# Patient Record
Sex: Female | Born: 1975 | Race: White | Hispanic: No | Marital: Married | Smoking: Former smoker
Health system: Southern US, Community
[De-identification: ages and names within clinical notes are randomized; demographics above are authoritative.]

## PROBLEM LIST (undated history)

## (undated) ENCOUNTER — Inpatient Hospital Stay (HOSPITAL_COMMUNITY): Payer: Self-pay

## (undated) DIAGNOSIS — Z9889 Other specified postprocedural states: Secondary | ICD-10-CM

## (undated) DIAGNOSIS — Z6835 Body mass index (BMI) 35.0-35.9, adult: Secondary | ICD-10-CM

## (undated) DIAGNOSIS — R200 Anesthesia of skin: Secondary | ICD-10-CM

## (undated) DIAGNOSIS — I10 Essential (primary) hypertension: Secondary | ICD-10-CM

## (undated) DIAGNOSIS — F319 Bipolar disorder, unspecified: Secondary | ICD-10-CM

## (undated) DIAGNOSIS — H9192 Unspecified hearing loss, left ear: Secondary | ICD-10-CM

## (undated) DIAGNOSIS — M5090 Cervical disc disorder, unspecified, unspecified cervical region: Secondary | ICD-10-CM

## (undated) DIAGNOSIS — K219 Gastro-esophageal reflux disease without esophagitis: Secondary | ICD-10-CM

## (undated) DIAGNOSIS — E785 Hyperlipidemia, unspecified: Secondary | ICD-10-CM

## (undated) DIAGNOSIS — R202 Paresthesia of skin: Secondary | ICD-10-CM

## (undated) DIAGNOSIS — O021 Missed abortion: Secondary | ICD-10-CM

## (undated) DIAGNOSIS — F419 Anxiety disorder, unspecified: Secondary | ICD-10-CM

## (undated) DIAGNOSIS — R112 Nausea with vomiting, unspecified: Secondary | ICD-10-CM

## (undated) DIAGNOSIS — R609 Edema, unspecified: Secondary | ICD-10-CM

## (undated) DIAGNOSIS — R51 Headache: Secondary | ICD-10-CM

## (undated) DIAGNOSIS — F329 Major depressive disorder, single episode, unspecified: Secondary | ICD-10-CM

## (undated) DIAGNOSIS — R0602 Shortness of breath: Secondary | ICD-10-CM

## (undated) DIAGNOSIS — E7800 Pure hypercholesterolemia, unspecified: Secondary | ICD-10-CM

## (undated) DIAGNOSIS — J45909 Unspecified asthma, uncomplicated: Secondary | ICD-10-CM

## (undated) DIAGNOSIS — N951 Menopausal and female climacteric states: Secondary | ICD-10-CM

## (undated) DIAGNOSIS — D649 Anemia, unspecified: Secondary | ICD-10-CM

## (undated) DIAGNOSIS — K029 Dental caries, unspecified: Secondary | ICD-10-CM

## (undated) DIAGNOSIS — F32A Depression, unspecified: Secondary | ICD-10-CM

## (undated) DIAGNOSIS — R079 Chest pain, unspecified: Secondary | ICD-10-CM

## (undated) DIAGNOSIS — J42 Unspecified chronic bronchitis: Secondary | ICD-10-CM

## (undated) DIAGNOSIS — R1011 Right upper quadrant pain: Secondary | ICD-10-CM

## (undated) HISTORY — DX: Anesthesia of skin: R20.0

## (undated) HISTORY — PX: CHOLECYSTECTOMY: SHX55

## (undated) HISTORY — DX: Chest pain, unspecified: R07.9

## (undated) HISTORY — DX: Right upper quadrant pain: R10.11

## (undated) HISTORY — DX: Shortness of breath: R06.02

## (undated) HISTORY — DX: Edema, unspecified: R60.9

## (undated) HISTORY — DX: Bipolar disorder, unspecified: F31.9

## (undated) HISTORY — DX: Cervical disc disorder, unspecified, unspecified cervical region: M50.90

## (undated) HISTORY — DX: Body mass index (BMI) 35.0-35.9, adult: Z68.35

## (undated) HISTORY — PX: OTHER SURGICAL HISTORY: SHX169

## (undated) HISTORY — PX: COLONOSCOPY: SHX174

## (undated) HISTORY — DX: Menopausal and female climacteric states: N95.1

## (undated) HISTORY — PX: ABDOMINAL HYSTERECTOMY: SHX81

## (undated) HISTORY — DX: Anesthesia of skin: R20.2

---

## 1998-02-01 ENCOUNTER — Other Ambulatory Visit: Admission: RE | Admit: 1998-02-01 | Discharge: 1998-02-01 | Payer: Self-pay | Admitting: *Deleted

## 1998-09-12 ENCOUNTER — Other Ambulatory Visit: Admission: RE | Admit: 1998-09-12 | Discharge: 1998-09-12 | Payer: Self-pay | Admitting: Obstetrics

## 1999-04-20 ENCOUNTER — Other Ambulatory Visit: Admission: RE | Admit: 1999-04-20 | Discharge: 1999-04-20 | Payer: Self-pay | Admitting: Obstetrics

## 1999-05-27 ENCOUNTER — Inpatient Hospital Stay (HOSPITAL_COMMUNITY): Admission: AD | Admit: 1999-05-27 | Discharge: 1999-05-27 | Payer: Self-pay | Admitting: Obstetrics

## 1999-08-26 ENCOUNTER — Inpatient Hospital Stay (HOSPITAL_COMMUNITY): Admission: AD | Admit: 1999-08-26 | Discharge: 1999-08-26 | Payer: Self-pay | Admitting: Obstetrics

## 1999-11-09 ENCOUNTER — Inpatient Hospital Stay (HOSPITAL_COMMUNITY): Admission: AD | Admit: 1999-11-09 | Discharge: 1999-11-09 | Payer: Self-pay | Admitting: Obstetrics

## 1999-11-10 ENCOUNTER — Inpatient Hospital Stay (HOSPITAL_COMMUNITY): Admission: AD | Admit: 1999-11-10 | Discharge: 1999-11-12 | Payer: Self-pay | Admitting: Obstetrics

## 2000-04-15 ENCOUNTER — Other Ambulatory Visit: Admission: RE | Admit: 2000-04-15 | Discharge: 2000-04-15 | Payer: Self-pay | Admitting: Obstetrics

## 2000-04-16 ENCOUNTER — Emergency Department (HOSPITAL_COMMUNITY): Admission: EM | Admit: 2000-04-16 | Discharge: 2000-04-17 | Payer: Self-pay | Admitting: Emergency Medicine

## 2000-04-16 ENCOUNTER — Encounter: Payer: Self-pay | Admitting: *Deleted

## 2000-07-04 ENCOUNTER — Encounter: Payer: Self-pay | Admitting: *Deleted

## 2000-07-04 ENCOUNTER — Ambulatory Visit (HOSPITAL_COMMUNITY): Admission: RE | Admit: 2000-07-04 | Discharge: 2000-07-04 | Payer: Self-pay | Admitting: *Deleted

## 2000-11-08 ENCOUNTER — Other Ambulatory Visit: Admission: RE | Admit: 2000-11-08 | Discharge: 2000-11-08 | Payer: Self-pay | Admitting: Obstetrics

## 2000-11-22 ENCOUNTER — Encounter: Payer: Self-pay | Admitting: Gastroenterology

## 2000-11-22 ENCOUNTER — Encounter: Admission: RE | Admit: 2000-11-22 | Discharge: 2000-11-22 | Payer: Self-pay | Admitting: Gastroenterology

## 2000-12-03 ENCOUNTER — Ambulatory Visit (HOSPITAL_COMMUNITY): Admission: RE | Admit: 2000-12-03 | Discharge: 2000-12-03 | Payer: Self-pay | Admitting: Gastroenterology

## 2000-12-03 ENCOUNTER — Encounter: Payer: Self-pay | Admitting: Gastroenterology

## 2001-01-06 ENCOUNTER — Observation Stay (HOSPITAL_COMMUNITY): Admission: RE | Admit: 2001-01-06 | Discharge: 2001-01-06 | Payer: Self-pay | Admitting: General Surgery

## 2001-01-06 ENCOUNTER — Encounter (INDEPENDENT_AMBULATORY_CARE_PROVIDER_SITE_OTHER): Payer: Self-pay

## 2002-08-11 ENCOUNTER — Encounter: Payer: Self-pay | Admitting: Surgery

## 2002-08-11 ENCOUNTER — Encounter: Admission: RE | Admit: 2002-08-11 | Discharge: 2002-08-11 | Payer: Self-pay | Admitting: Surgery

## 2005-02-13 ENCOUNTER — Emergency Department (HOSPITAL_COMMUNITY): Admission: EM | Admit: 2005-02-13 | Discharge: 2005-02-13 | Payer: Self-pay | Admitting: Emergency Medicine

## 2010-01-19 ENCOUNTER — Emergency Department: Payer: Self-pay | Admitting: Emergency Medicine

## 2010-01-19 ENCOUNTER — Encounter: Payer: Self-pay | Admitting: Cardiovascular Disease

## 2010-01-25 ENCOUNTER — Ambulatory Visit: Payer: Self-pay | Admitting: Cardiovascular Disease

## 2010-01-25 DIAGNOSIS — R51 Headache: Secondary | ICD-10-CM

## 2010-01-25 DIAGNOSIS — R079 Chest pain, unspecified: Secondary | ICD-10-CM

## 2010-01-25 DIAGNOSIS — R519 Headache, unspecified: Secondary | ICD-10-CM | POA: Insufficient documentation

## 2010-01-31 ENCOUNTER — Ambulatory Visit: Payer: Self-pay | Admitting: Cardiovascular Disease

## 2010-08-22 NOTE — Letter (Signed)
Summary: Work Writer at [employer]. [address]   [place], [place] [address]   Phone: [phone]  Fax: [phone]     January 25, 2010    [facility]   The above named patient had a medical visit today at:     9:30  am / pm.  Please take this into consideration when reviewing the time away from work/school.      Sincerely yours,    Architectural technologist

## 2010-08-22 NOTE — Assessment & Plan Note (Signed)
Summary: NEW PT   Visit Type:  Initial Consult Referring Provider:  Lurena Joiner Lord,M.D. Primary Provider:  Dixie Dials, M.D.  CC:  Was at [facility] on January 19, 2010 with chest pain, and shortness of breath and headache on left side. Has irreg. heart beats mostly with activity.  .  History of Present Illness: Monica Ballard is a very pleasant 35 year old woman with no significant cardiac history, a long smoking history with history of bronchitis/pneumonia last year, presenting for evaluation of symptoms of chest discomfort, severe headache, weakness, numbness over her left arm, losing weight, poor appetite and easy bruising.  She reports that she was recently seen in the emergency room for severe headache. She woke up early in the morning to a severe headache on the left side of her head. She then developed radiating discomfort down the left side of her neck, left posterior shoulder, left arm, around her waist, left leg, some chest discomfort as well. She never had symptoms like this before. 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 She is decreased her work schedule since her headaches and chest discomfort and arm discomfort. She noted in the emergency room that her blood pressure gets a little below, and reports having occasional dizziness at work. She also reports noting a low heart rate for a brief period of time in the emergency room with heart rates in the 40s.she has had a 15 pound weight loss over the past several months. She reports bad sleep hygiene, feeling tired all the time.  EKG today shows normal sinus rhythm with rate 64 beats per minute, no significant ST or T wave changes.  EKG an emergency room June 30 shows sinus rhythm with sinus arrhythmia and no significant ST or T wave changes.  Preventive Screening-Counseling & Management  Alcohol-Tobacco     Smoking Status: current  Caffeine-Diet-Exercise  Does Patient Exercise: no      Drug Use:  no.    Current Medications (verified): 1)  Cymbalta 60 Mg Cpep (Duloxetine Hcl) .Marland Kitchen.. 1 Once Daily 2)  Klonopin 1 Mg Tabs (Clonazepam) .Marland Kitchen.. 1 Two Times A Day 3)  Baby Aspirin 81 Mg Chew (Aspirin) .Marland Kitchen.. 1 Once Daily 4)  Albuterol Sulfate 0.63 Mg/74ml Nebu (Albuterol Sulfate) .... As Needed  Allergies (verified): 1)  ! Pcn  Past History:  Past Surgical History: Left ear drum rebuilt x 2 gallbladder removed  Family History: Family History of Cancer:  Family History of Coronary Artery Disease: mother  MVD: mother Family History of Diabetes:  Family History of Hyperlipidemia: mother Family History of Hypertension: mother & father Family History of Depression:   Social History: Part Time  Married  Tobacco Use - Yes. 1-1/2 PPD x 8 years. Alcohol Use - no Regular Exercise - no Drug Use - no Smoking Status:  current Does Patient Exercise:  no Drug Use:  no  Review of Systems       The patient complains of chest pain.  The patient denies fever, weight loss, weight gain, vision loss, decreased hearing, hoarseness, syncope, dyspnea on exertion, peripheral edema, prolonged cough, abdominal pain, incontinence, muscle weakness, depression, and enlarged lymph nodes.         headache, posterior shoulder pain, dizziness  Vital Signs:  Patient profile:   35 year old female Height:      69 inches Weight:      132 pounds BMI:     19.56 Pulse  rate:   70 / minute BP sitting:   122 / 76  (left arm) Cuff size:   regular  Vitals Entered By: Bishop Dublin, CMA (January 25, 2010 9:46 AM)  Physical Exam  General:  Well developed, well nourished, in no acute distress. Head:  normocephalic and atraumatic Neck:  Neck supple, no JVD. No masses, thyromegaly or abnormal cervical nodes. Lungs:  Clear bilaterally to auscultation and percussion. Heart:  Non-displaced PMI, chest non-tender; regular rate and rhythm, S1, S2 without murmurs, rubs or gallops.  Carotid upstroke normal, no bruit. Pedals normal pulses. No edema, no varicosities. Abdomen:  Bowel sounds positive; abdomen soft and non-tender without masses Msk:  Back normal, normal gait. Muscle strength and tone normal. Pulses:  pulses normal in all 4 extremities Extremities:  No clubbing or cyanosis. Neurologic:  Alert and oriented x 3. Skin:  Intact without lesions or rashes. Psych:  Normal affect.   Impression & Recommendations:  Problem # 1:  CHEST PAIN (ICD-786.50)  her chest pain presented in the setting of headache, arm pain, posterior shoulder pain, left leg pain. I am less concerned about cardiac etiology and for concerned about atypical migraine, cluster headache/tension headache. He cannot even rule out a small TIA.  We order a routine treadmill stress test to be done at [facility] rule out any underlying cardiac dysfunction. If this is negative, she would benefit from some neurologic workup. I cannot rule out depression as she has had weight loss, poor sleep, diffuse aches and pains.  Her updated medication list for this problem includes:    Baby Aspirin 81 Mg Chew (Aspirin) .Marland Kitchen... 1 once daily    Nitrostat 0.4 Mg Subl (Nitroglycerin) .Marland Kitchen... 1 tablet under tongue at onset of chest pain; you may repeat every 5 minutes for up to 3 doses.  Orders: EKG w/ Interpretation (93000) Treadmill (Treadmill)  Her updated medication list for this problem includes:    Baby Aspirin 81 Mg Chew (Aspirin) .Marland Kitchen... 1 once daily    Nitrostat 0.4 Mg Subl (Nitroglycerin) .Marland Kitchen... 1 tablet under tongue at onset of chest pain; you may repeat every 5 minutes for up to 3 doses.  Her updated medication list for this problem includes:    Baby Aspirin 81 Mg Chew (Aspirin) .Marland Kitchen... 1 once daily    Nitrostat 0.4 Mg Subl (Nitroglycerin) .Marland Kitchen... 1 tablet under tongue at onset of chest pain; you may repeat every 5 minutes for up to 3 doses.  Problem # 2:  HEADACHE (ICD-784.0)  Etiology of her headache is likely  noncardiac. I would suggest evaluation by a neurologist.  Her updated medication list for this problem includes:    Baby Aspirin 81 Mg Chew (Aspirin) .Marland Kitchen... 1 once daily    Her updated medication list for this problem includes:    Baby Aspirin 81 Mg Chew (Aspirin) .Marland Kitchen... 1 once daily  Her updated medication list for this problem includes:    Baby Aspirin 81 Mg Chew (Aspirin) .Marland Kitchen... 1 once daily  Orders: Treadmill (Treadmill)  Patient Instructions: 1)  Your physician has requested that you have an exercise tolerance test.  For further information please visit [email or web address]  Please also follow instruction sheet, as given. 2)  Your physician has recommended you make the following change in your medication: Nitro 0.4 for chest pain Prescriptions: NITROSTAT 0.4 MG SUBL (NITROGLYCERIN) 1 tablet under tongue at onset of chest pain; you may repeat every 5 minutes for up to 3 doses.  #25 x 1   Entered by:  Benedict Needy, RN   Authorized by:   Dossie Arbour MD   Signed by:   Benedict Needy, RN on 01/25/2010   Method used:   Electronically to        [facility] (retail)       [address]       [place], [place]  [address]       Ph: [phone]       Fax: [phone]   RxID:   [id number]

## 2010-08-22 NOTE — Letter (Signed)
Summary: Historic Patient File  Historic Patient File   Imported By: [place] Carbo 01/25/2010 14:43:42  _____________________________________________________________________  External Attachment:    Type:   Image     Comment:   External Document

## 2010-12-06 HISTORY — PX: CHOLECYSTECTOMY, LAPAROSCOPIC: SHX56

## 2010-12-08 NOTE — Op Note (Signed)
[facility]  Patient:    Monica Ballard, Monica Ballard                       MRN: [id number] Proc. Date: 01/06/01 Attending:  Gita Kudo, M.D. CC:         Barbette Hair. Arlyce Dice, M.D. [facility]  Dortha Kern, Montez Hageman., M.D.  Kathreen Cosier, M.D.   Operative Report  PREOPERATIVE DIAGNOSIS:  Dysfunction gallbladder.  POSTOPERATIVE DIAGNOSIS:  Dysfunction gallbladder, pending pathology, nevus of suprapubic area.  OPERATION:  Laparoscopic cholecystectomy.  SURGEON:  Gita Kudo, M.D.  ASSISTANT:  Ellis Parents, M.D.  ANESTHESIA:  General endotracheal.  CLINICAL SUMMARY:  The patient is a 35 year old female, with a several year history of chronic abdominal pain.  She has had nonspecific symptoms, somewhat worse in the right upper quadrant, associated with nausea.  She has some fatty food intolerance but has multiple food intolerances.  She has had medication for ulcer - acid disease without effect.  There is a family history of gallbladder disease.  She has had a completely negative work-up including multiple ultrasounds, CAT scan, endoscopy, nuclear gastric emptying study, HIDA scan, blood test including amylase, liver function, CBC.  After consultation, she comes in for an empiric cholecystectomy.  OPERATIVE FINDINGS:  The gallbladder did have some filmy adhesions around it, but it was soft, normal in size and after removing and palpating carefully, had no stones.  Cystic duct and artery were normal in anatomy and size.  The liver looked normal.  DESCRIPTION OF PROCEDURE:  Under satisfactory general endotracheal anesthesia, the patients abdomen was prepped and draped in a standard fashion.  A transverse incision was made above the umbilicus, midline opened in the peritoneum and controlled with a figure-of-eight 0 Vicryl suture.  Operating Hasson port inserted, CO2 pneumoperitoneum established, camera placed.  Under direct vision, through Marcaine  infiltrated sites, two #5 ports placed laterally and a second #10 port medially.  Operating through the medial port, with lateral port graspers giving excellent exposure, I carefully dissected down the filmy adhesions sand then dissected out the cystic duct gallbladder junction.  The cystic duct and artery each circumferentially dissected using right angle clamp.  When certain of the anatomy, these structures were controlled with multiple metal clips and divided.  The gallbladder was then removed from below upward using the coagulating spatula for both hemostasis and dissection.  The gallbladder was then severed.  The operative site was checked for hemostasis which was good, and then was rinsed with saline and suctioned dry.  Then the camera was moved to the upper port and a large grasper placed in the umbilical port and used to retrieve the gallbladder intact and without spillage or complication.  Then the CO2 ports were released under direct vision and the wounds closed with the previously placed figure-of-eight for the midline, as well as a second interrupted 0 Vicryl, and then this area also infiltrated with Marcaine.  Subcutaneous appropriated with 4-0 Vicryl and all skin sites appropriated with Steri-Strips.  Sterile dressings were applied.  Then the suprapubic mole was excised and in an elliptical fashion and the defect close with interrupted 4-0 Vicryl suture.  She went to the recovery room from the operating room in good condition without complication. DD:  01/06/01 TD:  01/06/01 Job: [id number]

## 2011-02-13 ENCOUNTER — Encounter: Payer: Self-pay | Admitting: Cardiovascular Disease

## 2011-12-28 ENCOUNTER — Encounter (HOSPITAL_COMMUNITY): Payer: Self-pay

## 2011-12-28 ENCOUNTER — Inpatient Hospital Stay (HOSPITAL_COMMUNITY): Payer: BC Managed Care – PPO

## 2011-12-28 ENCOUNTER — Inpatient Hospital Stay (HOSPITAL_COMMUNITY)
Admission: AD | Admit: 2011-12-28 | Discharge: 2011-12-28 | Disposition: A | Discharge status: Home / Self Care | Payer: BC Managed Care – PPO | Source: Ambulatory Visit | Attending: Obstetrics and Gynecology | Admitting: Obstetrics and Gynecology

## 2011-12-28 DIAGNOSIS — O200 Threatened abortion: Secondary | ICD-10-CM | POA: Insufficient documentation

## 2011-12-28 DIAGNOSIS — O209 Hemorrhage in early pregnancy, unspecified: Secondary | ICD-10-CM

## 2011-12-28 HISTORY — DX: Anxiety disorder, unspecified: F41.9

## 2011-12-28 HISTORY — DX: Hyperlipidemia, unspecified: E78.5

## 2011-12-28 HISTORY — DX: Major depressive disorder, single episode, unspecified: F32.9

## 2011-12-28 HISTORY — DX: Depression, unspecified: F32.A

## 2011-12-28 LAB — CBC
Hemoglobin: 13.2 g/dL (ref 12.0–15.0)
MCH: 30.1 pg (ref 26.0–34.0)
MCHC: 33.2 g/dL (ref 30.0–36.0)
Platelets: 283 10*3/uL (ref 150–400)
RDW: 13.6 % (ref 11.5–15.5)

## 2011-12-28 LAB — URINALYSIS, ROUTINE W REFLEX MICROSCOPIC
Bilirubin Urine: NEGATIVE
Glucose, UA: NEGATIVE mg/dL
Leukocytes, UA: NEGATIVE
Protein, ur: NEGATIVE mg/dL
pH: 6 (ref 5.0–8.0)

## 2011-12-28 LAB — POCT PREGNANCY, URINE: Preg Test, Ur: POSITIVE — AB

## 2011-12-28 NOTE — MAU Note (Signed)
Patient states she has had a positive pregnancy test at [facility]. Started bleeding on 5-25, passing some clots, until yesterday. No bleeding today. Has some cramping and headache today.

## 2011-12-28 NOTE — MAU Provider Note (Signed)
Monica Ballard [id number] @[redacted]w[redacted]d  by LMP Chief Complaint  Patient presents with  . Vaginal Bleeding  . Abdominal Pain  . Nausea  . Headache     First Provider Initiated Contact with Patient 12/28/11 2216      SUBJECTIVE  HPI: Monica Ballard 10/31/2011 and she began bleeding intermittently heavy and with some clots 12/15/2011. Initiated with crampy lower abdominal pain. She was seen in the office for blood work including thyroid and today got a phone call from them saying that she is pregnant and that she should come here to be seen. Now she she is having brown scant spotting but is still having lower abdominal cramps.   Past Medical History  Diagnosis Date  . Preterm labor   . Anxiety   . Depression   . Hyperlipidemia    Past Surgical History  Procedure Date  . Left ear drum rebuilt     x2  . Gallbladder removed    History   Social History  . Marital Status: Married    Spouse Name: N/A    Number of Children: N/A  . Years of Education: N/A   Occupational History  . Not on file.   Social History Main Topics  . Smoking status: Current Everyday Smoker -- 0.5 packs/day    Types: Cigarettes  . Smokeless tobacco: Not on file   Comment: 1.5 ppd x8 years   . Alcohol Use: No  . Drug Use: No  . Sexually Active: Yes    Birth Control/ Protection: None   Other Topics Concern  . Not on file   Social History Narrative   Married, part time, does not get regular exercise.    No current facility-administered medications on file prior to encounter.   Current Outpatient Prescriptions on File Prior to Encounter  Medication Sig Dispense Refill  . albuterol (ACCUNEB) 0.63 MG/3ML nebulizer solution Take 1 ampule by nebulization every 6 (six) hours as needed.        . citalopram (CELEXA) 20 MG tablet Take 20 mg by mouth daily.      . clonazePAM (KLONOPIN) 1 MG tablet Take 1 mg by mouth 3 (three) times daily.       Marland Kitchen aspirin 81 MG chewable tablet Chew 81 mg by mouth daily.        .  DULoxetine (CYMBALTA) 60 MG capsule Take 60 mg by mouth daily.        . nitroGLYCERIN (NITROSTAT) 0.4 MG SL tablet Place 0.4 mg under the tongue every 5 (five) minutes as needed.         Allergies  Allergen Reactions  . Penicillins Hives    ROS: Pertinent items in HPI  OBJECTIVE Blood pressure 113/63, pulse 63, temperature 98.9 F (37.2 C), temperature source Oral, resp. rate 16, height 5\' 7"  (1.702 m), weight 66.225 kg (146 lb), last menstrual period 10/31/2011, SpO2 98.00%.  GENERAL: Well-developed, well-nourished female in no acute distress.  HEENT: Normocephalic, good dentition HEART: normal rate RESP: normal effort ABDOMEN: Soft, nontender EXTREMITIES: Nontender, no edema NEURO: Alert and oriented SPECULUM EXAM: NEFG, physiologic discharge, moderate blood noted, cervix clean BIMANUAL: cervix closed; uterus slightly enlarged; no adnexal tenderness or masses   LAB RESULTS Results for orders placed during the Ballard encounter of 12/28/11 (from the past 24 hour(s))  URINALYSIS, ROUTINE W REFLEX MICROSCOPIC     Status: Normal   Collection Time   12/28/11  7:51 PM      Component Value Range   Color, Urine YELLOW  YELLOW    APPearance CLEAR  CLEAR    Specific Gravity, Urine 1.010  1.005 - 1.030    pH 6.0  5.0 - 8.0    Glucose, UA NEGATIVE  NEGATIVE (mg/dL)   Hgb urine dipstick NEGATIVE  NEGATIVE    Bilirubin Urine NEGATIVE  NEGATIVE    Ketones, ur NEGATIVE  NEGATIVE (mg/dL)   Protein, ur NEGATIVE  NEGATIVE (mg/dL)   Urobilinogen, UA 0.2  0.0 - 1.0 (mg/dL)   Nitrite NEGATIVE  NEGATIVE    Leukocytes, UA NEGATIVE  NEGATIVE   HCG, QUANTITATIVE, PREGNANCY     Status: Abnormal   Collection Time   12/28/11  7:54 PM      Component Value Range   hCG, Beta Chain, Quant, S 81191 (*) <5 (mIU/mL)  ABO/RH     Status: Normal (Preliminary result)   Collection Time   12/28/11  7:54 PM      Component Value Range   ABO/RH(D) O POS    CBC     Status: Normal   Collection Time   12/28/11   7:54 PM      Component Value Range   WBC 9.8  4.0 - 10.5 (K/uL)   RBC 4.39  3.87 - 5.11 (MIL/uL)   Hemoglobin 13.2  12.0 - 15.0 (g/dL)   HCT 47.8  29.5 - 62.1 (%)   MCV 90.4  78.0 - 100.0 (fL)   MCH 30.1  26.0 - 34.0 (pg)   MCHC 33.2  30.0 - 36.0 (g/dL)   RDW 30.8  65.7 - 84.6 (%)   Platelets 283  150 - 400 (K/uL)  POCT PREGNANCY, URINE     Status: Abnormal   Collection Time   12/28/11  7:55 PM      Component Value Range   Preg Test, Ur POSITIVE (*) NEGATIVE    [place]:  Irregular GS with YS; ? Early embryo with CRL [redacted]w[redacted]d, no cardiac activity.     ASSESSMENT G3 P1102  Early pregnancy bleeding, hemodynamically stable Threatened SAB  PLAN  Discharge with bleeding precautions and pelvic rest. May use Tylenol for pain. C/W Monica Ballard who believes her Monica Ballard was 7500 in the office yesterday. She is advised to F/U with Monica Ballard. Call the office Mon am 12/31/2011     Monica Ballard 12/28/2011 10:16 PM

## 2011-12-28 NOTE — Discharge Instructions (Signed)
Pelvic Rest Pelvic rest is sometimes recommended for women when:   The placenta is partially or completely covering the opening of the cervix (placenta previa).   There is bleeding between the uterine wall and the amniotic sac in the first trimester (subchorionic hemorrhage).   The cervix begins to open without labor starting (incompetent cervix, cervical insufficiency).   The labor is too early (preterm labor).  HOME CARE INSTRUCTIONS  Do not have sexual intercourse, stimulation, or an orgasm.   Do not use tampons, douche, or put anything in the vagina.   Do not lift anything over 10 pounds (4.5 kg).   Avoid strenuous activity or straining your pelvic muscles.  SEEK MEDICAL CARE IF:  You have any vaginal bleeding during pregnancy. Treat this as a potential emergency.   You have cramping pain felt low in the stomach (stronger than menstrual cramps).   You notice vaginal discharge (watery, mucus, or bloody).   You have a low, dull backache.   There are regular contractions or uterine tightening.  SEEK IMMEDIATE MEDICAL CARE IF: You have vaginal bleeding and have placenta previa.  Document Released: 11/03/2010 Document Revised: 06/28/2011 Document Reviewed: 11/03/2010 [facility] Patient Information 2012 [place], [place].Vaginal Bleeding During Pregnancy, First Trimester A small amount of bleeding (spotting) is relatively common in early pregnancy. It usually stops on its own. There are many causes for bleeding or spotting in early pregnancy. Some bleeding may be related to the pregnancy and some may not. Cramping with the bleeding is more serious and concerning. Tell your caregiver if you have any vaginal bleeding.  CAUSES   It is normal in most cases.   The pregnancy ends (miscarriage).   The pregnancy may end (threatened miscarriage).   Infection or inflammation of the cervix.   Growths (polyps) on the cervix.   Pregnancy happens outside of the uterus and in a fallopian  tube (tubal pregnancy).   Many tiny cysts in the uterus instead of pregnancy tissue (molar pregnancy).  SYMPTOMS  Vaginal bleeding or spotting with or without cramps. DIAGNOSIS  To evaluate the pregnancy, your caregiver may:  Do a pelvic exam.   Take blood tests.   Do an ultrasound.  It is very important to follow your caregiver's instructions.  TREATMENT   Evaluation of the pregnancy with blood tests and ultrasound.   Bed rest (getting up to use the bathroom only).   Rho-gam immunization if the mother is Rh negative and the father is Rh positive.  HOME CARE INSTRUCTIONS   If your caregiver orders bed rest, you may need to make arrangements for the care of other children and for other responsibilities. However, your caregiver may allow you to continue light activity.   Keep track of the number of pads you use each day, how often you change pads and how soaked (saturated) they are. Write this down.   Do not use tampons. Do not douche.   Do not have sexual intercourse or orgasms until approved by your physician.   Save any tissue that you pass for your caregiver to see.   Take medicine for cramps only with your caregiver's permission.   Do not take aspirin because it can make you bleed.  SEEK IMMEDIATE MEDICAL CARE IF:   You experience severe cramps in your stomach, back or belly (abdomen).   You have an oral temperature above 102 F (38.9 C), not controlled by medicine.   You pass large clots or tissue.   Your bleeding increases or you become  light-headed, weak or have fainting episodes.   You develop chills.   You are leaking or have a gush of fluid from your vagina.   You pass out while having a bowel movement. That may mean you have a ruptured tubal pregnancy.  Document Released: 04/18/2005 Document Revised: 06/28/2011 Document Reviewed: 10/28/2008 [facility] Patient Information 2012 [place], [place].

## 2011-12-29 LAB — ABO/RH: ABO/RH(D): O POS

## 2011-12-31 ENCOUNTER — Encounter (HOSPITAL_COMMUNITY): Payer: Self-pay | Admitting: *Deleted

## 2011-12-31 ENCOUNTER — Other Ambulatory Visit: Payer: Self-pay | Admitting: Obstetrics and Gynecology

## 2011-12-31 ENCOUNTER — Inpatient Hospital Stay (HOSPITAL_COMMUNITY)
Admission: AD | Admit: 2011-12-31 | Discharge: 2011-12-31 | Disposition: A | Discharge status: Hospice | Payer: BC Managed Care – PPO | Source: Ambulatory Visit | Attending: Obstetrics and Gynecology | Admitting: Obstetrics and Gynecology

## 2011-12-31 DIAGNOSIS — O200 Threatened abortion: Secondary | ICD-10-CM

## 2011-12-31 HISTORY — DX: Unspecified chronic bronchitis: J42

## 2011-12-31 LAB — HCG, QUANTITATIVE, PREGNANCY: hCG, Beta Chain, Quant, S: 10817 m[IU]/mL — ABNORMAL HIGH (ref <5)

## 2011-12-31 NOTE — MAU Note (Signed)
Had called office as instructed, dr told her to come in to make sure her preg levels were going up.

## 2011-12-31 NOTE — MAU Provider Note (Signed)
History     CSN: [id number]  Arrival date and time: 12/31/11 1703   None     Chief Complaint  Patient presents with  . Follow-up   HPI Pt is seen here for f/u of beta HCG.  Pt was seen on 6/7 in the office with positive pregnant test and history of bleeding intermittently with clots on 5/25.  Pt's Quant was 10,823 on 6/7 and US showed irregular gestational sac with Yolk Sac, questionable fetal pole with no cardiac activity.  Her HCG today is 10,817.    Past Medical History  Diagnosis Date  . Preterm labor   . Anxiety   . Depression   . Hyperlipidemia   . Chronic bronchitis     Past Surgical History  Procedure Date  . Left ear drum rebuilt     x2  . Gallbladder removed     scope    Family History  Problem Relation Age of Onset  . Cancer      family hx  . Coronary artery disease Mother     also MVD  . Hyperlipidemia Mother   . Hypertension Mother   . Diabetes      family hx  . Hypertension Father   . Depression      family hx   . Anesthesia problems Neg Hx     History  Substance Use Topics  . Smoking status: Current Everyday Smoker -- 0.5 packs/day    Types: Cigarettes  . Smokeless tobacco: Not on file   Comment: 1.5 ppd x8 years   . Alcohol Use: No    Allergies:  Allergies  Allergen Reactions  . Penicillins Hives    Prescriptions prior to admission  Medication Sig Dispense Refill  . albuterol (ACCUNEB) 0.63 MG/3ML nebulizer solution Take 1 ampule by nebulization every 6 (six) hours as needed.        . citalopram (CELEXA) 20 MG tablet Take 20 mg by mouth daily.      . clonazePAM (KLONOPIN) 1 MG tablet Take 1 mg by mouth 3 (three) times daily.         ROS Physical Exam   Blood pressure 112/68, pulse 63, temperature 99 F (37.2 C), temperature source Oral, resp. rate 20, height 5' 6.5" (1.689 m), weight 144 lb (65.318 kg), last menstrual period 10/31/2011.  Physical Exam  Vitals reviewed. Constitutional: She is oriented to person, place, and  time. She appears well-developed and well-nourished.  HENT:  Head: Normocephalic.  Eyes: Pupils are equal, round, and reactive to light.  Neck: Normal range of motion. Neck supple.  Cardiovascular: Normal rate.   Respiratory: Effort normal.  GI: Soft. She exhibits no distension. There is no tenderness.  Musculoskeletal: Normal range of motion.  Neurological: She is alert and oriented to person, place, and time.  Skin: Skin is warm and dry.  Psychiatric: She has a normal mood and affect.    MAU Course  Procedures Results for orders placed during the hospital encounter of 12/31/11 (from the past 24 hour(s))  HCG, QUANTITATIVE, PREGNANCY     Status: Abnormal   Collection Time   12/31/11  5:30 PM      Component Value Range   hCG, Beta Chain, Quant, S 10817 (*) <5 (mIU/mL)  results discussed with Dr. Christell Constant- pt to f/u with her doctor tomorrow.  Discussed findings with Dr. Christell Constant- advised pt to f/u with Dr. Dion Body tomorrow Assessment and Plan  Threatened miscarriage F/u with Dr. Temple Pacini 12/31/2011, 7:21 PM

## 2012-01-02 ENCOUNTER — Other Ambulatory Visit: Payer: Self-pay | Admitting: Obstetrics and Gynecology

## 2012-01-02 DIAGNOSIS — O200 Threatened abortion: Secondary | ICD-10-CM

## 2012-01-09 DIAGNOSIS — O021 Missed abortion: Secondary | ICD-10-CM

## 2012-01-09 HISTORY — DX: Missed abortion: O02.1

## 2012-01-14 ENCOUNTER — Other Ambulatory Visit: Payer: Self-pay | Admitting: Obstetrics and Gynecology

## 2012-01-14 ENCOUNTER — Encounter (HOSPITAL_COMMUNITY): Payer: Self-pay | Admitting: Pharmacy Technician

## 2012-01-14 NOTE — H&P (Signed)
01/14/2012  History of Present Illness  General:  32 y/oG3P2002  presents for f/u regarding inevitable miscarriage. Pt was given Cytotec 800 mcg vaginally in the office. She had so much bleeding, she called after hours and Methergine was sent in. Pt states she passed grapefruit size clots. She is feeling ok now that bleeding has slowed but she occasionally feels dizzy and has chills. She does not want any medical therapy and opts for D&C. Ultrasound shows thickened endometrium with hyperechoic area in lower uterine segment with blood flow. [place] unchanged after products removed.   Current Medications  Xopenex HFA 45 MCG/ACT Aerosol 2 puffs every 6 hrs  Klonopin 1 MG Tablet 1 tablet three times a day  Celexa 20 MG Tablet 1 tablet Once a day  Methergine 0.2 MG Tablet 1 tablet every 6 hours  Medication List reviewed and reconciled with the patient   Past Medical History  normal PFTs February 2011, she has been told that she had COPD in the past due to hyperinflation seen on chest x-ray  anxiety/depression-has tried multiple medications including Cymbalta, Effexor, Lexapro, Paxil, Prozac, Wellbutrin, Zoloft  Menorrhagia, pelvic pain  last Pap January 2012 normal, endometrial biopsy performed at the same time shows normal endometrium   Surgical History  cholecystectomy   eardrum repair twice    Family History  Father: alive 23 yrs HTN   Mother: deceased 33 yrs CAD, HTN, mitral valve prolapse   Paternal Grand Mother: colon cancer   Brother 1: alive 22 yrs   Sister 1: alive 22 yrs cervical cancer, hep C   Sister 2: alive 35 yrs anxiety, depression   Sister 3: alive 62 yrs von willebrand   one more sister 59 anxiety and depression no breast or ovarian cancer.   Social History  General:  History of smoking  cigarettes: Current smoker Frequency: 1/2 PPD Estimated Pack-years: 10 Smoking: less than 1/2 pk/day, weaning down; started 2003.  no Alcohol.  Caffeine: yes, 7-8 Mt Dew a day.    no Recreational drug use.  Diet: poor.  Exercise: NONE.  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  Marital Status: married.  Children: 2 children.    Gyn History  Periods : irregular.  LMP 10/31/11, drown colored discharge, no red colored bleeding.  Denies H/O Birth control .  Last pap smear date 11/27/11.  Denies H/O Last mammogram date .  Denies H/O Abnormal pap smear .  Menarche 13.    OB History  Number of pregnancies 3.  Pregnancy # 1 live birth, vaginal delivery.  Pregnancy # 2 live birth, vaginal delivery.  Pregnancy # 3 current.    Allergies  Penicillin  Advair Diskus: thrush  Tramadol: syncope   Hospitalization/Major Diagnostic Procedure  childbirth x 2    ROS:  See HPI.  Vital Signs  Wt 144, Ht 67.25, BMI 22.38, Pulse sitting 80, BP sitting 120/60.   Physical Examination  GENERAL:  Patient appears alert and oriented.  General Appearance: well-appearing, well-developed, no acute distress.  Speech: clear.  ABDOMEN:  General: non tender, non distended.  FEMALE GENITOURINARY:  Cervix visualized, healthy appearing, no discharge, no lesions, large clot with decidua or products at os. Easily removed. Cervix prepped with betadine. Uterine forceps used to explore fundus an no obvious tissue retrieved.  Vagina: pink/moist mucosa, no lesions, old blood.  Vulva: normal, no lesions, no skin discoloration.     Assessments   1. Miscarriage - 634.90 (Primary)   2.  Incomplete miscarriage - 634.91   Treatment  1. Miscarriage  Diagnostic Imaging:US ECHO TRANSVAGINAL  Proceed with suction D&C. Pt counseled on R/B/A. Desires to proceed. Pt was to have CBC prior to leaving office today but it was not drawn.    2. Incomplete miscarriage  Referral To: Reason:Precert for Suction D&C. Pt scheduled for 6/26 at 12:30 pm    Procedure Codes  16109 ECHO TRANSVAGINAL   Follow Up  2 Weeks post op

## 2012-01-15 ENCOUNTER — Encounter (HOSPITAL_COMMUNITY): Payer: Self-pay | Admitting: *Deleted

## 2012-01-15 MED ORDER — CIPROFLOXACIN IN D5W 400 MG/200ML IV SOLN
400.0000 mg | INTRAVENOUS | Status: AC
  Administered 2012-01-16: 400 mg via INTRAVENOUS
  Filled 2012-01-15: qty 200

## 2012-01-15 MED ORDER — METRONIDAZOLE IN NACL 5-0.79 MG/ML-% IV SOLN
500.0000 mg | INTRAVENOUS | Status: AC
  Administered 2012-01-16: 500 mg via INTRAVENOUS
  Filled 2012-01-15: qty 100

## 2012-01-16 ENCOUNTER — Ambulatory Visit (HOSPITAL_COMMUNITY)
Admission: RE | Admit: 2012-01-16 | Discharge: 2012-01-16 | Disposition: A | Discharge status: Home / Self Care | Payer: BC Managed Care – PPO | Source: Ambulatory Visit | Attending: Obstetrics and Gynecology | Admitting: Obstetrics and Gynecology

## 2012-01-16 ENCOUNTER — Encounter (HOSPITAL_COMMUNITY): Payer: Self-pay | Admitting: Anesthesiology

## 2012-01-16 ENCOUNTER — Encounter (HOSPITAL_COMMUNITY)
Admission: RE | Disposition: A | Discharge status: Home / Self Care | Payer: Self-pay | Source: Ambulatory Visit | Attending: Obstetrics and Gynecology

## 2012-01-16 ENCOUNTER — Ambulatory Visit (HOSPITAL_COMMUNITY): Payer: BC Managed Care – PPO | Admitting: Anesthesiology

## 2012-01-16 DIAGNOSIS — O034 Incomplete spontaneous abortion without complication: Secondary | ICD-10-CM | POA: Insufficient documentation

## 2012-01-16 HISTORY — DX: Headache: R51

## 2012-01-16 HISTORY — DX: Unspecified asthma, uncomplicated: J45.909

## 2012-01-16 HISTORY — PX: DILATION AND EVACUATION: SHX1459

## 2012-01-16 HISTORY — DX: Nausea with vomiting, unspecified: R11.2

## 2012-01-16 HISTORY — DX: Missed abortion: O02.1

## 2012-01-16 HISTORY — DX: Pure hypercholesterolemia, unspecified: E78.00

## 2012-01-16 HISTORY — DX: Gastro-esophageal reflux disease without esophagitis: K21.9

## 2012-01-16 HISTORY — DX: Other specified postprocedural states: Z98.890

## 2012-01-16 LAB — CBC
Platelets: 291 10*3/uL (ref 150–400)
RDW: 14 % (ref 11.5–15.5)
WBC: 7.6 10*3/uL (ref 4.0–10.5)

## 2012-01-16 LAB — DIFFERENTIAL
Basophils Absolute: 0 10*3/uL (ref 0.0–0.1)
Basophils Relative: 0 % (ref 0–1)
Lymphocytes Relative: 36 % (ref 12–46)
Neutro Abs: 4.3 10*3/uL (ref 1.7–7.7)
Neutrophils Relative %: 56 % (ref 43–77)

## 2012-01-16 LAB — HCG, QUANTITATIVE, PREGNANCY: hCG, Beta Chain, Quant, S: 997 m[IU]/mL — ABNORMAL HIGH (ref <5)

## 2012-01-16 SURGERY — DILATION AND EVACUATION, UTERUS
Anesthesia: Monitor Anesthesia Care | Site: Uterus | Wound class: Clean Contaminated

## 2012-01-16 MED ORDER — KETOROLAC TROMETHAMINE 30 MG/ML IJ SOLN: 15.0000 mg | Freq: Once | INTRAMUSCULAR | Status: DC | PRN

## 2012-01-16 MED ORDER — KETOROLAC TROMETHAMINE 60 MG/2ML IM SOLN
INTRAMUSCULAR | Status: DC | PRN
  Administered 2012-01-16: 30 mg via INTRAMUSCULAR

## 2012-01-16 MED ORDER — KETOROLAC TROMETHAMINE 30 MG/ML IJ SOLN
INTRAMUSCULAR | Status: AC
  Filled 2012-01-16: qty 1

## 2012-01-16 MED ORDER — FENTANYL CITRATE 0.05 MG/ML IJ SOLN
INTRAMUSCULAR | Status: AC
  Filled 2012-01-16: qty 5

## 2012-01-16 MED ORDER — FENTANYL CITRATE 0.05 MG/ML IJ SOLN
INTRAMUSCULAR | Status: DC | PRN
  Administered 2012-01-16 (×2): 50 ug via INTRAVENOUS

## 2012-01-16 MED ORDER — LIDOCAINE HCL 2 % IJ SOLN
INTRAMUSCULAR | Status: DC | PRN
  Administered 2012-01-16: 10 mL

## 2012-01-16 MED ORDER — MIDAZOLAM HCL 2 MG/2ML IJ SOLN
INTRAMUSCULAR | Status: AC
  Filled 2012-01-16: qty 2

## 2012-01-16 MED ORDER — PROPOFOL 10 MG/ML IV EMUL
INTRAVENOUS | Status: DC | PRN
  Administered 2012-01-16: 70 ug/kg/min via INTRAVENOUS

## 2012-01-16 MED ORDER — LIDOCAINE HCL 2 % IJ SOLN
INTRAMUSCULAR | Status: AC
  Filled 2012-01-16: qty 1

## 2012-01-16 MED ORDER — ACETAMINOPHEN 325 MG PO TABS: 325.0000 mg | ORAL_TABLET | ORAL | Status: DC | PRN

## 2012-01-16 MED ORDER — PROMETHAZINE HCL 25 MG/ML IJ SOLN: 6.2500 mg | INTRAMUSCULAR | Status: DC | PRN

## 2012-01-16 MED ORDER — ONDANSETRON HCL 4 MG/2ML IJ SOLN
INTRAMUSCULAR | Status: DC | PRN
  Administered 2012-01-16: 4 mg via INTRAVENOUS

## 2012-01-16 MED ORDER — LACTATED RINGERS IV SOLN
INTRAVENOUS | Status: DC
  Administered 2012-01-16: 12:00:00 via INTRAVENOUS

## 2012-01-16 MED ORDER — ONDANSETRON HCL 4 MG/2ML IJ SOLN
INTRAMUSCULAR | Status: AC
  Filled 2012-01-16: qty 2

## 2012-01-16 MED ORDER — PROPOFOL 10 MG/ML IV EMUL
INTRAVENOUS | Status: AC
  Filled 2012-01-16: qty 20

## 2012-01-16 MED ORDER — KETOROLAC TROMETHAMINE 30 MG/ML IJ SOLN
INTRAMUSCULAR | Status: DC | PRN
  Administered 2012-01-16: 30 mg via INTRAVENOUS

## 2012-01-16 MED ORDER — MEPERIDINE HCL 25 MG/ML IJ SOLN: 6.2500 mg | INTRAMUSCULAR | Status: DC | PRN

## 2012-01-16 MED ORDER — MIDAZOLAM HCL 2 MG/2ML IJ SOLN: 0.5000 mg | Freq: Once | INTRAMUSCULAR | Status: DC | PRN

## 2012-01-16 MED ORDER — ONDANSETRON HCL 4 MG/2ML IJ SOLN: 4.0000 mg | Freq: Once | INTRAMUSCULAR | Status: DC

## 2012-01-16 MED ORDER — IBUPROFEN 200 MG PO TABS: 200.0000 mg | ORAL_TABLET | Freq: Four times a day (QID) | ORAL | Status: AC | PRN

## 2012-01-16 MED ORDER — FENTANYL CITRATE 0.05 MG/ML IJ SOLN
25.0000 ug | INTRAMUSCULAR | Status: DC | PRN
  Administered 2012-01-16: 50 ug via INTRAVENOUS
  Administered 2012-01-16: 25 ug via INTRAVENOUS

## 2012-01-16 MED ORDER — DEXAMETHASONE SODIUM PHOSPHATE 10 MG/ML IJ SOLN
INTRAMUSCULAR | Status: AC
  Filled 2012-01-16: qty 1

## 2012-01-16 MED ORDER — LIDOCAINE HCL (CARDIAC) 20 MG/ML IV SOLN
INTRAVENOUS | Status: AC
  Filled 2012-01-16: qty 5

## 2012-01-16 MED ORDER — MIDAZOLAM HCL 5 MG/5ML IJ SOLN
INTRAMUSCULAR | Status: DC | PRN
  Administered 2012-01-16: 2 mg via INTRAVENOUS

## 2012-01-16 MED ORDER — FENTANYL CITRATE 0.05 MG/ML IJ SOLN
INTRAMUSCULAR | Status: AC
  Administered 2012-01-16: 50 ug via INTRAVENOUS
  Filled 2012-01-16: qty 2

## 2012-01-16 MED ORDER — METHYLERGONOVINE MALEATE 0.2 MG PO TABS: 0.2000 mg | ORAL_TABLET | Freq: Four times a day (QID) | ORAL | Status: DC

## 2012-01-16 MED ORDER — MISOPROSTOL 200 MCG PO TABS
ORAL_TABLET | ORAL | Status: AC
  Filled 2012-01-16: qty 5

## 2012-01-16 MED ORDER — PROPOFOL 10 MG/ML IV EMUL
INTRAVENOUS | Status: DC | PRN
  Administered 2012-01-16: 50 mg via INTRAVENOUS
  Administered 2012-01-16: 30 mg via INTRAVENOUS
  Administered 2012-01-16 (×2): 20 mg via INTRAVENOUS

## 2012-01-16 SURGICAL SUPPLY — 15 items
CATH ROBINSON RED A/P 16FR (CATHETERS) ×2 IMPLANT
CLOTH BEACON ORANGE TIMEOUT ST (SAFETY) ×2 IMPLANT
DECANTER SPIKE VIAL GLASS SM (MISCELLANEOUS) ×2 IMPLANT
GLOVE BIO SURGEON STRL SZ7 (GLOVE) ×6 IMPLANT
GOWN PREVENTION PLUS LG XLONG (DISPOSABLE) ×4 IMPLANT
KIT BERKELEY 1ST TRIMESTER 3/8 (MISCELLANEOUS) ×2 IMPLANT
NDL SPNL 22GX3.5 QUINCKE BK (NEEDLE) ×1 IMPLANT
NEEDLE SPNL 22GX3.5 QUINCKE BK (NEEDLE) ×2 IMPLANT
NS IRRIG 1000ML POUR BTL (IV SOLUTION) ×2 IMPLANT
PACK VAGINAL MINOR WOMEN LF (CUSTOM PROCEDURE TRAY) ×2 IMPLANT
PAD PREP 24X48 CUFFED NSTRL (MISCELLANEOUS) ×2 IMPLANT
SET BERKELEY SUCTION TUBING (SUCTIONS) ×2 IMPLANT
SYR CONTROL 10ML LL (SYRINGE) ×2 IMPLANT
TOWEL OR 17X24 6PK STRL BLUE (TOWEL DISPOSABLE) ×4 IMPLANT
VACURETTE 8 RIGID CVD (CANNULA) ×1 IMPLANT

## 2012-01-16 NOTE — Anesthesia Preprocedure Evaluation (Signed)
Anesthesia Evaluation  Patient identified by MRN, date of birth, ID band Patient awake    Reviewed: Allergy & Precautions, H&P , Patient's Chart, lab work & pertinent test results, reviewed documented beta blocker date and time   History of Anesthesia Complications (+) PONV  Airway Mallampati: II TM Distance: >3 FB Neck ROM: full    Dental No notable dental hx.    Pulmonary neg pulmonary ROS, asthma ,  breath sounds clear to auscultation  Pulmonary exam normal       Cardiovascular Exercise Tolerance: Good negative cardio ROS  Rhythm:regular Rate:Normal     Neuro/Psych  Headaches, negative neurological ROS  negative psych ROS   GI/Hepatic negative GI ROS, Neg liver ROS, GERD-  Controlled,  Endo/Other  negative endocrine ROS  Renal/GU negative Renal ROS     Musculoskeletal   Abdominal   Peds  Hematology negative hematology ROS (+)   Anesthesia Other Findings Anxiety     Depression        Hyperlipidemia     Chronic bronchitis   nebulizer and rescue inhaler    Asthma   smoker-uses nebulizer-not used in last 3 months. albuterol rescue inhaler used approx a month ago Hypercholesteremia        GERD (gastroesophageal reflux disease)   occasional -tums Headache        Missed ab 01/09/2012 10weeks-used cytotec    Reproductive/Obstetrics negative OB ROS                           Anesthesia Physical Anesthesia Plan  ASA: II  Anesthesia Plan: MAC   Post-op Pain Management:    Induction:   Airway Management Planned:   Additional Equipment:   Intra-op Plan:   Post-operative Plan:   Informed Consent: I have reviewed the patients History and Physical, chart, labs and discussed the procedure including the risks, benefits and alternatives for the proposed anesthesia with the patient or authorized representative who has indicated his/her understanding and acceptance.   Dental Advisory  Given  Plan Discussed with: CRNA, Surgeon and Anesthesiologist  Anesthesia Plan Comments:         Anesthesia Quick Evaluation

## 2012-01-16 NOTE — Op Note (Signed)
Monica Ballard, REINECKE NO.:  [id number]  MEDICAL RECORD NO.:  [id number]  LOCATION:  [facility]                          FACILITY:  [facility]  PHYSICIAN:  Pieter Partridge, MD   DATE OF BIRTH:  [birth year]  DATE OF PROCEDURE:  01/16/2012 DATE OF DISCHARGE:  01/16/2012                              OPERATIVE REPORT   PREOPERATIVE DIAGNOSIS:  Retained incomplete abortion.  PROCEDURE:  Suction, D and C.  SURGEONS:  Pieter Partridge, MD.  PHYSICIAN ASSISTANT:  None.  ASSISTANT TECH:  None.  ANESTHESIA:  MAC.  EBL:  Minimal.  URINE OUTPUT:  300 prior to the procedure.  ANESTHESIA:  Local is 2% Xylocaine 10 mL.  SPECIMEN:  Products of conception to pathology.  PLAN OF CARE:  The patient will be discharged from PACU.  The patient's disposition to PACU hemodynamically stable.  INDICATIONS:  Ms. Top is a 36 year old, gravida 3, para 2-0-0-2, who presented a few weeks ago with bleeding.  A gestational sac with a fetal pole without a heartbeat was noted.  Her quants were followed which were dropping, and ultrasound was repeated which showed a miscarriage in progress.  She received Cytotec vaginally 800 mcg.  She did have a significant amount of bleeding but it was not sufficient to completely evacuate the uterus.  Due to the heavy bleeding she had, she did not want to do any more medical therapy.  She was counseled on surgical management and desired to proceed.  PROCEDURE IN DETAIL:  She was taken to the operating room, where she underwent sedation without complication.  She was placed in the dorsal lithotomy position and prepped and draped in normal sterile fashion.  I and O catheterization of bladder was performed.  A Grave speculum was then inserted into the vagina.  Cervix was visualized, and a paracervical block of 10 mL with 2% lidocaine was used.  The uterus was sounded to 7 to 8 cm.  It easily accepted a 6 Hegar.  She was then dilated up to a 9 and 8 mm  curette was easily advanced to the uterine fundus and suction, curettage was performed.  One pass of sharp curette noted gritty cry noted of all 4 quadrants of the uterus noetd, final suction curettage was performed to clean the uterus.  The tenaculum was removed, and the tenaculum site was hemostatic.  All instruments were removed from the vagina.  The patient tolerated the procedure well.  She gets Cipro and Flagyl prior to the procedure.  The SCDs were in place, and she was comfortable throughout.     Pieter Partridge, MD     EBV/MEDQ  D:  01/16/2012  T:  01/16/2012  Job:  [id number]

## 2012-01-16 NOTE — Transfer of Care (Signed)
Immediate Anesthesia Transfer of Care Note  Patient: Monica Ballard  Procedure(s) Performed: Procedure(s) (LRB): DILATATION AND EVACUATION (N/A)  Patient Location: PACU  Anesthesia Type: MAC  Level of Consciousness: awake, alert  and oriented  Airway & Oxygen Therapy: Patient Spontanous Breathing  Post-op Assessment: Report given to PACU RN and Post -op Vital signs reviewed and stable  Post vital signs: Reviewed and stable  Complications: No apparent anesthesia complications

## 2012-01-16 NOTE — Discharge Instructions (Signed)
Dilation and Curettage or Vacuum Curettage  Care After  Refer to this sheet in the next few weeks. These instructions provide you with general information on caring for yourself after your procedure. Your caregiver may also give you more specific instructions. Your treatment has been planned according to current medical practices, but problems sometimes occur. Call your caregiver if you have any problems or questions after your procedure.  HOME CARE INSTRUCTIONS   · Do not drive for 24 hours.  · Wait 1 week before returning to strenuous activities.  · Take your temperature 2 times a day for 4 days and write it down. Provide these temperatures to your caregiver if you develop a fever.  · Avoid long periods of standing, and do no heavy lifting (more than 10 pounds or 4.5 kg), pushing, or pulling.  · Limit stair climbing to once or twice a day.  · Take rest periods often.  · You may resume your usual diet.  · Drink enough fluids to keep your urine clear or pale yellow.  · You should return to your usual bowel function. If constipation should occur, you may:  · Take a mild laxative with permission from your caregiver.  · Add fruit and bran to your diet.  · Drink more fluids.  · Take showers instead of baths until your caregiver gives you permission to take baths.  · Do not go swimming or use a hot tub until your caregiver gives you permission.  · Try to have someone with you or available to you the first 24 to 48 hours, especially if you had a general anesthetic.  · Do not douche, use tampons, or have intercourse until after your follow-up appointment, or when your caregiver approves.  · Only take over-the-counter or prescription medicines for pain, discomfort, or fever as directed by your caregiver. Do not take aspirin. It can cause bleeding.  · If a prescription was given, follow your caregiver's directions.  · Keep all your follow-up appointments recommended by your caregiver.  SEEK MEDICAL CARE IF:   · You have  increasing cramps or pain not relieved with medicine.  · You have abdominal pain which does not seem to be related to the same area of earlier cramping and pain.  · You have bad smelling vaginal discharge.  · You have a rash.  · You have problems with any medicine.  SEEK IMMEDIATE MEDICAL CARE IF:   · You have bleeding that is heavier than a normal menstrual period.  · You have a fever.  · You have chest pain.  · You have shortness of breath.  · You feel dizzy or feel like fainting.  · You pass out.  · You have pain in your shoulder strap area.  · You have heavy vaginal bleeding with or without blood clots.  MAKE SURE YOU:   · Understand these instructions.  · Will watch your condition.  · Will get help right away if you are not doing well or get worse.  Document Released: 07/06/2000 Document Revised: 06/28/2011 Document Reviewed: 02/03/2009  Exit[employer]® Patient Information ©2012 [employer].

## 2012-01-16 NOTE — Anesthesia Postprocedure Evaluation (Signed)
Anesthesia Post Note  Patient: Monica Ballard  Procedure(s) Performed: Procedure(s) (LRB): DILATATION AND EVACUATION (N/A)  Anesthesia type: MAC  Patient location: PACU  Post pain: Pain level controlled  Post assessment: Post-op Vital signs reviewed  Last Vitals:  Filed Vitals:   01/16/12 1117  BP: 111/72  Pulse: 67  Temp: 36.7 C  Resp: 16    Post vital signs: Reviewed  Level of consciousness: sedated  Complications: No apparent anesthesia complications

## 2012-01-16 NOTE — H&P (View-Only) (Signed)
01/14/2012  History of Present Illness  General:  36 y/oG3P2002  presents for f/u regarding inevitable miscarriage. Pt was given Cytotec 800 mcg vaginally in the office. She had so much bleeding, she called after hours and Methergine was sent in. Pt states she passed grapefruit size clots. She is feeling ok now that bleeding has slowed but she occasionally feels dizzy and has chills. She does not want any medical therapy and opts for D&C. Ultrasound shows thickened endometrium with hyperechoic area in lower uterine segment with blood flow. US unchanged after products removed.   Current Medications  Xopenex HFA 45 MCG/ACT Aerosol 2 puffs every 6 hrs  Klonopin 1 MG Tablet 1 tablet three times a day  Celexa 20 MG Tablet 1 tablet Once a day  Methergine 0.2 MG Tablet 1 tablet every 6 hours  Medication List reviewed and reconciled with the patient   Past Medical History  normal PFTs February 2011, she has been told that she had COPD in the past due to hyperinflation seen on chest x-ray  anxiety/depression-has tried multiple medications including Cymbalta, Effexor, Lexapro, Paxil, Prozac, Wellbutrin, Zoloft  Menorrhagia, pelvic pain  last Pap January 2012 normal, endometrial biopsy performed at the same time shows normal endometrium   Surgical History  cholecystectomy   eardrum repair twice    Family History  Father: alive 60 yrs HTN   Mother: deceased 47 yrs CAD, HTN, mitral valve prolapse   Paternal Grand Mother: colon cancer   Brother 1: alive 22 yrs   Sister 1: alive 22 yrs cervical cancer, hep C   Sister 2: alive 35 yrs anxiety, depression   Sister 3: alive 39 yrs von willebrand   one more sister 27 anxiety and depression no breast or ovarian cancer.   Social History  General:  History of smoking  cigarettes: Current smoker Frequency: 1/2 PPD Estimated Pack-years: 10 Smoking: less than 1/2 pk/day, weaning down; started 2003.  no Alcohol.  Caffeine: yes, 7-8 Mt Dew a day.    no Recreational drug use.  Diet: poor.  Exercise: NONE.  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  Marital Status: married.  Children: 2 children.    Gyn History  Periods : irregular.  LMP 10/31/11, drown colored discharge, no red colored bleeding.  Denies H/O Birth control .  Last pap smear date 11/27/11.  Denies H/O Last mammogram date .  Denies H/O Abnormal pap smear .  Menarche 13.    OB History  Number of pregnancies 3.  Pregnancy # 1 live birth, vaginal delivery.  Pregnancy # 2 live birth, vaginal delivery.  Pregnancy # 3 current.    Allergies  Penicillin  Advair Diskus: thrush  Tramadol: syncope   Hospitalization/Major Diagnostic Procedure  childbirth x 2    ROS:  See HPI.  Vital Signs  Wt 144, Ht 67.25, BMI 22.38, Pulse sitting 80, BP sitting 120/60.   Physical Examination  GENERAL:  Patient appears alert and oriented.  General Appearance: well-appearing, well-developed, no acute distress.  Speech: clear.  ABDOMEN:  General: non tender, non distended.  FEMALE GENITOURINARY:  Cervix visualized, healthy appearing, no discharge, no lesions, large clot with decidua or products at os. Easily removed. Cervix prepped with betadine. Uterine forceps used to explore fundus an no obvious tissue retrieved.  Vagina: pink/moist mucosa, no lesions, old blood.  Vulva: normal, no lesions, no skin discoloration.     Assessments   1. Miscarriage - 634.90 (Primary)   2.   Incomplete miscarriage - 634.91   Treatment  1. Miscarriage  Diagnostic Imaging:US ECHO TRANSVAGINAL  Proceed with suction D&C. Pt counseled on R/B/A. Desires to proceed. Pt was to have CBC prior to leaving office today but it was not drawn.    2. Incomplete miscarriage  Referral To: Reason:Precert for Suction D&C. Pt scheduled for 6/26 at 12:30 pm    Procedure Codes  76830 ECHO TRANSVAGINAL   Follow Up  2 Weeks post op    

## 2012-01-16 NOTE — Interval H&P Note (Signed)
History and Physical Interval Note:  01/16/2012 11:38 AM  Monica Ballard  has presented today for surgery, with the diagnosis of Retained Products  The various methods of treatment have been discussed with the patient and family. After consideration of risks, benefits and other options for treatment, the patient has consented to  Procedure(s) (LRB): DILATATION AND EVACUATION (N/A) as a surgical intervention .  The patient's history has been reviewed, patient examined, no change in status, stable for surgery.  I have reviewed the patients' chart and labs.  Questions were answered to the patient's satisfaction.   Pt broke out yesterday, suspects bug bites.  Diffuse erythematous well circumscribed lesions on left lower buttock and left lower abdomen.    Dion Body, Ronnika Collett

## 2012-01-16 NOTE — Brief Op Note (Signed)
01/16/2012  2:17 PM  PATIENT:  Monica Ballard  36 y.o. female  PRE-OPERATIVE DIAGNOSIS: Incomplete Ab   POST-OPERATIVE DIAGNOSIS:  same  PROCEDURE:  Procedure(s) (LRB): DILATATION AND EVACUATION (N/A)  SURGEON:  Surgeon(s) and Role:    * Geryl Rankins, MD - Primary  PHYSICIAN ASSISTANT:   ASSISTANTS: none   ANESTHESIA:   local and MAC  EBL:  Total I/O In: 1100 [I.V.:1100] Out: 375 [Urine:300; Blood:75]  BLOOD ADMINISTERED:none  DRAINS: none   LOCAL MEDICATIONS USED:  XYLOCAINE 2% 10 cc  SPECIMEN:  Source of Specimen:  Products of conception  DISPOSITION OF SPECIMEN:  PATHOLOGY  COUNTS:  YES  TOURNIQUET:  * No tourniquets in log *  DICTATION: .Other Dictation: Dictation Number [id number]  PLAN OF CARE: Discharge to home after PACU  PATIENT DISPOSITION:  PACU - hemodynamically stable.   Delay start of Pharmacological VTE agent (>24hrs) due to surgical blood loss or risk of bleeding: not applicable

## 2012-01-17 ENCOUNTER — Encounter (HOSPITAL_COMMUNITY): Payer: Self-pay | Admitting: Obstetrics and Gynecology

## 2012-05-07 ENCOUNTER — Other Ambulatory Visit: Payer: Self-pay | Admitting: Obstetrics and Gynecology

## 2012-05-07 ENCOUNTER — Other Ambulatory Visit (HOSPITAL_COMMUNITY)
Admission: RE | Admit: 2012-05-07 | Discharge: 2012-05-07 | Disposition: A | Discharge status: Home / Self Care | Payer: BC Managed Care – PPO | Source: Ambulatory Visit | Attending: Obstetrics and Gynecology | Admitting: Obstetrics and Gynecology

## 2012-05-07 DIAGNOSIS — Z1151 Encounter for screening for human papillomavirus (HPV): Secondary | ICD-10-CM | POA: Insufficient documentation

## 2012-05-07 DIAGNOSIS — Z01419 Encounter for gynecological examination (general) (routine) without abnormal findings: Secondary | ICD-10-CM | POA: Insufficient documentation

## 2012-06-03 ENCOUNTER — Encounter (HOSPITAL_COMMUNITY): Payer: Self-pay | Admitting: *Deleted

## 2012-06-03 NOTE — OR Nursing (Addendum)
Case moved to 11:00 by Myrene due to cancellation of another patient.

## 2012-06-04 ENCOUNTER — Encounter (HOSPITAL_COMMUNITY): Payer: Self-pay | Admitting: Pharmacist

## 2012-06-04 ENCOUNTER — Encounter (HOSPITAL_COMMUNITY): Payer: Self-pay

## 2012-06-04 ENCOUNTER — Encounter (HOSPITAL_COMMUNITY)
Admission: RE | Admit: 2012-06-04 | Discharge: 2012-06-04 | Disposition: A | Discharge status: Home / Self Care | Payer: BC Managed Care – PPO | Source: Ambulatory Visit | Attending: Obstetrics and Gynecology | Admitting: Obstetrics and Gynecology

## 2012-06-04 HISTORY — DX: Anemia, unspecified: D64.9

## 2012-06-04 LAB — CBC
HCT: 39.4 % (ref 36.0–46.0)
Hemoglobin: 13.2 g/dL (ref 12.0–15.0)
MCV: 83.7 fL (ref 78.0–100.0)
RBC: 4.71 MIL/uL (ref 3.87–5.11)
WBC: 9.5 10*3/uL (ref 4.0–10.5)

## 2012-06-04 NOTE — Patient Instructions (Signed)
Your procedure is scheduled on:06/11/12  Enter through the Main Entrance at :930 am Pick up desk phone and dial [phone] and inform us of your arrival.  Please call [phone] if you have any problems the morning of surgery.  Remember: Do not eat  after midnight: Tuesday Water ok until 7am on Wed  Take these meds the morning of surgery with a sip of water:Celexa, Klonopin  DO NOT wear jewelry, eye make-up, lipstick,body lotion, or dark fingernail polish. Do not shave for 48 hours prior to surgery.   Patients discharged on the day of surgery will not be allowed to drive home.

## 2012-06-06 ENCOUNTER — Other Ambulatory Visit: Payer: Self-pay | Admitting: Obstetrics and Gynecology

## 2012-06-10 ENCOUNTER — Other Ambulatory Visit: Payer: Self-pay | Admitting: Obstetrics and Gynecology

## 2012-06-10 MED ORDER — CIPROFLOXACIN IN D5W 400 MG/200ML IV SOLN
400.0000 mg | INTRAVENOUS | Status: AC
  Administered 2012-06-11: 400 mg via INTRAVENOUS
  Filled 2012-06-10: qty 200

## 2012-06-10 MED ORDER — METRONIDAZOLE IN NACL 5-0.79 MG/ML-% IV SOLN
500.0000 mg | INTRAVENOUS | Status: AC
  Administered 2012-06-11: 500 mg via INTRAVENOUS
  Filled 2012-06-10: qty 100

## 2012-06-10 NOTE — H&P (Signed)
05/28/2012  History of Present Illness  General:  36 y/o G3P2A1 presents to discuss further management of menometrorrhagia and pelvic pain. Pt had an incomplete abortion which required suction D&C 4-5 months ago. Since that time she has had heavy irregular menses that are painful. Pt has taken Vicoden several times daily, which moderately controls the pain. Pt has a h/o similar symptoms a few years ago but a work up with [facility] was never completed. Started Fluroiquinolone and Flagyl for possible endometritis with some mild improvement. Clear vaginal discharge currently, no bleeding.   Current Medications  Xopenex HFA 45 MCG/ACT Aerosol 2 puffs every 6 hrs  Celexa 20 MG Tablet 1 tablet Once a day  Klonopin 1 MG Tablet 1 tablet three times a day  Ibuprofen 600 MG Tablet 1 tablet every 6 hrs prn  Boric Acid tablets as directed once a day as needed  Vicodin 5-500 MG Tablet 1-2 tablets as needed for pain every 4- 6 hrs prn pain  Integra 62.5-62.5-40-3 MG Capsule as directed One tablet daily  Flagyl 500 MG Tablet 1 tablet Two times a day  Levaquin 500 MG Tablet 1 tablet Once a day  Medication List reviewed and reconciled with the patient   Past Medical History  normal PFTs February 2011, she has been told that she had COPD in the past due to hyperinflation seen on chest x-ray  anxiety/depression-has tried multiple medications including Cymbalta, Effexor, Lexapro, Paxil, Prozac, Wellbutrin, Zoloft  Menorrhagia, pelvic pain  last Pap January 2012 normal, endometrial biopsy performed at the same time shows normal endometrium   Surgical History  cholecystectomy   eardrum repair twice   D&C 2013   Family History  Father: alive 54 yrs HTN   Mother: deceased 69 yrs CAD, HTN, mitral valve prolapse   Paternal Grand Mother: colon cancer   Brother 1: alive 22 yrs   Sister 1: alive 22 yrs cervical cancer, hep C   Sister 2: alive 35 yrs anxiety, depression   Sister 3: alive 6 yrs von willebrand     one more sister 23 anxiety and depression.   Social History  General:  History of smoking  cigarettes: Current smoker Frequency: 1/2 PPD Estimated Pack-years: 10 Smoking: less than 1/2 pk/day, weaning down; started 2003.  no Alcohol.  Caffeine: yes, 7-8 Mt Dew a day.  no Recreational drug use.  Diet: poor.  Exercise: NONE.  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  Marital Status: married.  Children: 2 children.    Gyn History  Periods : irregular.  LMP 04/12/12- first part of October and now is spotting.  Denies H/O Birth control .  Last pap smear date 05/07/12, ASCUS, neg HPV.  Denies H/O Last mammogram date .  Denies H/O Abnormal pap smear .  Menarche 13.    OB History  Number of pregnancies 3.  Pregnancy # 1 live birth, vaginal delivery.  Pregnancy # 2 live birth, vaginal delivery.  Pregnancy # 3 miscarriage.    Allergies  Penicillin  Advair Diskus: thrush  Tramadol: syncope   Hospitalization/Major Diagnostic Procedure  childbirth x 2    Review of Systems  No fever/chills, N/V/D. See HPI.   Vital Signs  Wt 143, Ht 67.25, BMI 22.23, Pulse sitting 80, BP sitting 120/74.   Physical Examination  GENERAL:  Patient appears in NAD, pleasant.  Build: well developed.  General Appearance: well-appearing, thin, poor dentitian.  Race: caucasian.  NECK:  ROM: normal.  Thyroid:  no thyromegaly, non tender.  LUNGS:  Breath sounds: clear to auscultation.  Dyspnea: no.  HEART:  Murmurs: none.  Rate: normal.  Rhythm: regular.  BREASTS:  Axilla: no palpable masses, no lymphadenopathy, nontender.  Breast Mass: no masses, dimpling, retraction, or nipple discharge , bilaterally.  Skin Change: unremarkable.  ABDOMEN:  General: no masses,tenderness,organomegaly, , BS normal, non distended.  FEMALE GENITOURINARY:  Adnexa: no mass, non tender.  Anus/perineum: normal, no lesions.  Cervix/ cuff: normal appearance ,  no lesions/discharge/bleeding, good pelvic support .  External genitalia: normal, no lesions, no skin discoloration.  Rectum: deferred.  Urethra: normal external meatus.  Uterus: normal size/shape/consistency, freely mobile, mildly tender.  Vagina: pink/moist mucosa, no lesions, no abnormal discharge, odorless.  Vulva: normal, no lesions, no skin discoloration.  EXTREMITIES:  Extremities no clubbing cyanosis or edema present.  NEUROLOGICAL:  gross motor and sensory grossly intact.  Orientation: alert and oriented x 3.     Assessments   1. Pre-op exam - V72.84 (Primary)   2. Pelvic pain in female - 625.9   3. Menorrhagia - 626.2   Treatment  1. Pelvic pain in female  Pt requiring narcotics round the clock. Recommended diagnostic laparoscopy to assess for causes of pain and menorrhagia, primarily to rule out endometriosis. Pt desires pregnancy so hysterectomy or ablation are not an option. Reviewed R/B/A of procedure, all questions answered, consent obtained.    Follow Up  2 Weeks post op

## 2012-06-11 ENCOUNTER — Encounter (HOSPITAL_COMMUNITY)
Admission: RE | Disposition: A | Discharge status: Home / Self Care | Payer: Self-pay | Source: Ambulatory Visit | Attending: Obstetrics and Gynecology

## 2012-06-11 ENCOUNTER — Ambulatory Visit (HOSPITAL_COMMUNITY): Payer: BC Managed Care – PPO | Admitting: Anesthesiology

## 2012-06-11 ENCOUNTER — Encounter (HOSPITAL_COMMUNITY): Payer: Self-pay | Admitting: Anesthesiology

## 2012-06-11 ENCOUNTER — Ambulatory Visit (HOSPITAL_COMMUNITY)
Admission: RE | Admit: 2012-06-11 | Discharge: 2012-06-11 | Disposition: A | Discharge status: Home / Self Care | Payer: BC Managed Care – PPO | Source: Ambulatory Visit | Attending: Obstetrics and Gynecology | Admitting: Obstetrics and Gynecology

## 2012-06-11 DIAGNOSIS — N949 Unspecified condition associated with female genital organs and menstrual cycle: Secondary | ICD-10-CM | POA: Insufficient documentation

## 2012-06-11 DIAGNOSIS — Z01812 Encounter for preprocedural laboratory examination: Secondary | ICD-10-CM | POA: Insufficient documentation

## 2012-06-11 DIAGNOSIS — N920 Excessive and frequent menstruation with regular cycle: Secondary | ICD-10-CM | POA: Insufficient documentation

## 2012-06-11 DIAGNOSIS — Z01818 Encounter for other preprocedural examination: Secondary | ICD-10-CM | POA: Insufficient documentation

## 2012-06-11 DIAGNOSIS — Z9889 Other specified postprocedural states: Secondary | ICD-10-CM

## 2012-06-11 HISTORY — PX: LAPAROSCOPY: SHX197

## 2012-06-11 HISTORY — PX: DILATION AND CURETTAGE OF UTERUS: SHX78

## 2012-06-11 LAB — TYPE AND SCREEN
ABO/RH(D): O POS
Antibody Screen: NEGATIVE

## 2012-06-11 SURGERY — LAPAROSCOPY OPERATIVE
Anesthesia: General | Site: Uterus | Wound class: Clean Contaminated

## 2012-06-11 MED ORDER — DEXAMETHASONE SODIUM PHOSPHATE 10 MG/ML IJ SOLN
INTRAMUSCULAR | Status: AC
  Filled 2012-06-11: qty 1

## 2012-06-11 MED ORDER — SILVER NITRATE-POT NITRATE 75-25 % EX MISC
CUTANEOUS | Status: AC
  Filled 2012-06-11: qty 1

## 2012-06-11 MED ORDER — SILVER NITRATE-POT NITRATE 75-25 % EX MISC
CUTANEOUS | Status: DC | PRN
  Administered 2012-06-11: 1

## 2012-06-11 MED ORDER — ONDANSETRON HCL 4 MG/2ML IJ SOLN
INTRAMUSCULAR | Status: DC | PRN
  Administered 2012-06-11: 4 mg via INTRAVENOUS

## 2012-06-11 MED ORDER — FENTANYL CITRATE 0.05 MG/ML IJ SOLN
INTRAMUSCULAR | Status: DC | PRN
  Administered 2012-06-11 (×5): 50 ug via INTRAVENOUS

## 2012-06-11 MED ORDER — PROMETHAZINE HCL 25 MG/ML IJ SOLN
INTRAMUSCULAR | Status: AC
  Administered 2012-06-11: 6.25 mg via INTRAVENOUS
  Filled 2012-06-11: qty 1

## 2012-06-11 MED ORDER — GLYCOPYRROLATE 0.2 MG/ML IJ SOLN
INTRAMUSCULAR | Status: AC
  Filled 2012-06-11: qty 2

## 2012-06-11 MED ORDER — FENTANYL CITRATE 0.05 MG/ML IJ SOLN
25.0000 ug | INTRAMUSCULAR | Status: DC | PRN
  Administered 2012-06-11: 50 ug via INTRAVENOUS

## 2012-06-11 MED ORDER — PROPOFOL 10 MG/ML IV EMUL
INTRAVENOUS | Status: AC
  Filled 2012-06-11: qty 20

## 2012-06-11 MED ORDER — LACTATED RINGERS IV SOLN
INTRAVENOUS | Status: DC
  Administered 2012-06-11 (×4): via INTRAVENOUS

## 2012-06-11 MED ORDER — LIDOCAINE HCL 2 % IJ SOLN
INTRAMUSCULAR | Status: DC | PRN
  Administered 2012-06-11: 20 mL

## 2012-06-11 MED ORDER — PROPOFOL 10 MG/ML IV EMUL
INTRAVENOUS | Status: DC | PRN
  Administered 2012-06-11: 120 mg via INTRAVENOUS

## 2012-06-11 MED ORDER — ROCURONIUM BROMIDE 100 MG/10ML IV SOLN
INTRAVENOUS | Status: DC | PRN
  Administered 2012-06-11: 35 mg via INTRAVENOUS
  Administered 2012-06-11: 5 mg via INTRAVENOUS

## 2012-06-11 MED ORDER — BUPIVACAINE HCL (PF) 0.25 % IJ SOLN
INTRAMUSCULAR | Status: AC
  Filled 2012-06-11: qty 90

## 2012-06-11 MED ORDER — NEOSTIGMINE METHYLSULFATE 1 MG/ML IJ SOLN
INTRAMUSCULAR | Status: DC | PRN
  Administered 2012-06-11: 1 mg via INTRAVENOUS

## 2012-06-11 MED ORDER — HYDROMORPHONE HCL PF 1 MG/ML IJ SOLN
INTRAMUSCULAR | Status: DC | PRN
  Administered 2012-06-11: 1 mg via INTRAVENOUS

## 2012-06-11 MED ORDER — TRAMADOL HCL 50 MG PO TABS
50.0000 mg | ORAL_TABLET | ORAL | Status: AC
  Administered 2012-06-11: 50 mg via ORAL
  Filled 2012-06-11: qty 1

## 2012-06-11 MED ORDER — HYDROMORPHONE HCL PF 1 MG/ML IJ SOLN
INTRAMUSCULAR | Status: AC
  Filled 2012-06-11: qty 1

## 2012-06-11 MED ORDER — LIDOCAINE HCL (CARDIAC) 20 MG/ML IV SOLN
INTRAVENOUS | Status: AC
  Filled 2012-06-11: qty 5

## 2012-06-11 MED ORDER — MIDAZOLAM HCL 2 MG/2ML IJ SOLN
INTRAMUSCULAR | Status: AC
  Filled 2012-06-11: qty 2

## 2012-06-11 MED ORDER — LIDOCAINE HCL (CARDIAC) 20 MG/ML IV SOLN
INTRAVENOUS | Status: DC | PRN
  Administered 2012-06-11: 60 mg via INTRAVENOUS

## 2012-06-11 MED ORDER — FENTANYL CITRATE 0.05 MG/ML IJ SOLN
INTRAMUSCULAR | Status: AC
  Administered 2012-06-11: 50 ug via INTRAVENOUS
  Filled 2012-06-11: qty 2

## 2012-06-11 MED ORDER — BUPIVACAINE HCL (PF) 0.25 % IJ SOLN
INTRAMUSCULAR | Status: DC | PRN
  Administered 2012-06-11: 30 mL

## 2012-06-11 MED ORDER — DEXAMETHASONE SODIUM PHOSPHATE 4 MG/ML IJ SOLN
INTRAMUSCULAR | Status: DC | PRN
  Administered 2012-06-11: 10 mg via INTRAVENOUS

## 2012-06-11 MED ORDER — METOCLOPRAMIDE HCL 5 MG/ML IJ SOLN
10.0000 mg | Freq: Once | INTRAMUSCULAR | Status: AC | PRN
  Administered 2012-06-11: 10 mg via INTRAVENOUS

## 2012-06-11 MED ORDER — LIDOCAINE HCL 2 % IJ SOLN
INTRAMUSCULAR | Status: AC
  Filled 2012-06-11: qty 20

## 2012-06-11 MED ORDER — FENTANYL CITRATE 0.05 MG/ML IJ SOLN
INTRAMUSCULAR | Status: AC
  Filled 2012-06-11: qty 5

## 2012-06-11 MED ORDER — METOCLOPRAMIDE HCL 5 MG/ML IJ SOLN
INTRAMUSCULAR | Status: AC
  Filled 2012-06-11: qty 2

## 2012-06-11 MED ORDER — ONDANSETRON HCL 4 MG/2ML IJ SOLN: INTRAMUSCULAR | Status: DC | PRN

## 2012-06-11 MED ORDER — NEOSTIGMINE METHYLSULFATE 1 MG/ML IJ SOLN
INTRAMUSCULAR | Status: AC
  Filled 2012-06-11: qty 10

## 2012-06-11 MED ORDER — MIDAZOLAM HCL 5 MG/5ML IJ SOLN
INTRAMUSCULAR | Status: DC | PRN
  Administered 2012-06-11: 2 mg via INTRAVENOUS

## 2012-06-11 MED ORDER — ROCURONIUM BROMIDE 50 MG/5ML IV SOLN
INTRAVENOUS | Status: AC
  Filled 2012-06-11: qty 1

## 2012-06-11 MED ORDER — GLYCOPYRROLATE 0.2 MG/ML IJ SOLN
INTRAMUSCULAR | Status: DC | PRN
  Administered 2012-06-11: 0.2 mg via INTRAVENOUS

## 2012-06-11 MED ORDER — MEPERIDINE HCL 25 MG/ML IJ SOLN: 6.2500 mg | INTRAMUSCULAR | Status: DC | PRN

## 2012-06-11 MED ORDER — PROMETHAZINE HCL 25 MG/ML IJ SOLN
6.2500 mg | INTRAMUSCULAR | Status: AC
  Administered 2012-06-11: 6.25 mg via INTRAVENOUS

## 2012-06-11 MED ORDER — ONDANSETRON HCL 4 MG/2ML IJ SOLN
INTRAMUSCULAR | Status: AC
  Filled 2012-06-11: qty 2

## 2012-06-11 SURGICAL SUPPLY — 40 items
ADH SKN CLS APL DERMABOND .7 (GAUZE/BANDAGES/DRESSINGS) ×2
APL SKNCLS STERI-STRIP NONHPOA (GAUZE/BANDAGES/DRESSINGS)
BAG SPEC RTRVL LRG 6X4 10 (ENDOMECHANICALS)
BENZOIN TINCTURE PRP APPL 2/3 (GAUZE/BANDAGES/DRESSINGS) IMPLANT
CATH ROBINSON RED A/P 16FR (CATHETERS) ×3 IMPLANT
CHLORAPREP W/TINT 26ML (MISCELLANEOUS) ×3 IMPLANT
CLOTH BEACON ORANGE TIMEOUT ST (SAFETY) ×3 IMPLANT
CONTAINER PREFILL 10% NBF 60ML (FORM) IMPLANT
DECANTER SPIKE VIAL GLASS SM (MISCELLANEOUS) ×3 IMPLANT
DERMABOND ADVANCED (GAUZE/BANDAGES/DRESSINGS) ×1
DERMABOND ADVANCED .7 DNX12 (GAUZE/BANDAGES/DRESSINGS) ×2 IMPLANT
DRESSING TELFA 8X3 (GAUZE/BANDAGES/DRESSINGS) ×3 IMPLANT
FORCEP GYRUS CUTTING 5MMX33CM (CUTTING FORCEPS) IMPLANT
GLOVE BIO SURGEON STRL SZ7 (GLOVE) ×9 IMPLANT
GLOVE BIOGEL PI IND STRL 7.0 (GLOVE) ×4 IMPLANT
GLOVE BIOGEL PI INDICATOR 7.0 (GLOVE) ×2
GOWN PREVENTION PLUS LG XLONG (DISPOSABLE) ×6 IMPLANT
GOWN STRL REIN XL XLG (GOWN DISPOSABLE) ×3 IMPLANT
NDL SPNL 22GX3.5 QUINCKE BK (NEEDLE) ×2 IMPLANT
NEEDLE SPNL 22GX3.5 QUINCKE BK (NEEDLE) ×3 IMPLANT
NS IRRIG 1000ML POUR BTL (IV SOLUTION) ×3 IMPLANT
PACK LAPAROSCOPY BASIN (CUSTOM PROCEDURE TRAY) ×3 IMPLANT
PACK VAGINAL MINOR WOMEN LF (CUSTOM PROCEDURE TRAY) ×3 IMPLANT
PAD OB MATERNITY 4.3X12.25 (PERSONAL CARE ITEMS) ×3 IMPLANT
PAD PREP 24X48 CUFFED NSTRL (MISCELLANEOUS) ×3 IMPLANT
POUCH SPECIMEN RETRIEVAL 10MM (ENDOMECHANICALS) IMPLANT
PROTECTOR NERVE ULNAR (MISCELLANEOUS) ×3 IMPLANT
SET IRRIG TUBING LAPAROSCOPIC (IRRIGATION / IRRIGATOR) IMPLANT
SLEEVE Z-THREAD 5X100MM (TROCAR) ×1 IMPLANT
STRIP CLOSURE SKIN 1/4X4 (GAUZE/BANDAGES/DRESSINGS) IMPLANT
SUT MNCRL AB 4-0 PS2 18 (SUTURE) ×3 IMPLANT
SUT PLAIN 2 0 (SUTURE)
SUT PLAIN ABS 2-0 CT1 27XMFL (SUTURE) IMPLANT
SUT VICRYL 0 UR6 27IN ABS (SUTURE) ×3 IMPLANT
SYR CONTROL 10ML LL (SYRINGE) ×3 IMPLANT
TOWEL OR 17X24 6PK STRL BLUE (TOWEL DISPOSABLE) ×6 IMPLANT
TRAY FOLEY CATH 14FR (SET/KITS/TRAYS/PACK) ×3 IMPLANT
TROCAR XCEL NON-BLD 11X100MML (ENDOMECHANICALS) IMPLANT
TROCAR XCEL NON-BLD 5MMX100MML (ENDOMECHANICALS) ×4 IMPLANT
WATER STERILE IRR 1000ML POUR (IV SOLUTION) ×3 IMPLANT

## 2012-06-11 NOTE — Transfer of Care (Signed)
Immediate Anesthesia Transfer of Care Note  Patient: Monica Ballard Suit  Procedure(s) Performed: Procedure(s) (LRB) with comments: LAPAROSCOPY OPERATIVE (N/A) DILATATION AND CURETTAGE (N/A)  Patient Location: PACU  Anesthesia Type:General  Level of Consciousness: awake, alert , oriented and patient cooperative  Airway & Oxygen Therapy: Patient Spontanous Breathing and Patient connected to nasal cannula oxygen  Post-op Assessment: Report given to PACU RN and Post -op Vital signs reviewed and stable  Post vital signs: Reviewed and stable  Complications: No apparent anesthesia complications

## 2012-06-11 NOTE — Anesthesia Preprocedure Evaluation (Signed)
Anesthesia Evaluation  Patient identified by MRN, date of birth, ID band Patient awake    Reviewed: Allergy & Precautions, H&P , NPO status , Patient's Chart, lab work & pertinent test results  History of Anesthesia Complications (+) PONV  Airway Mallampati: I TM Distance: >3 FB Neck ROM: Full    Dental No notable dental hx. (+) Poor Dentition   Pulmonary asthma ,  breath sounds clear to auscultation  Pulmonary exam normal       Cardiovascular negative cardio ROS  Rhythm:Regular Rate:Normal     Neuro/Psych  Headaches, PSYCHIATRIC DISORDERS Anxiety Depression    GI/Hepatic Neg liver ROS, GERD-  ,  Endo/Other  Hyperlipidemia  Renal/GU negative Renal ROS  negative genitourinary   Musculoskeletal negative musculoskeletal ROS (+)   Abdominal Normal abdominal exam  (+)   Peds  Hematology negative hematology ROS (+)   Anesthesia Other Findings   Reproductive/Obstetrics Pelvic Pain  Menorrhagia                           Anesthesia Physical Anesthesia Plan  ASA: II  Anesthesia Plan: General   Post-op Pain Management:    Induction: Intravenous  Airway Management Planned: Oral ETT  Additional Equipment:   Intra-op Plan:   Post-operative Plan: Extubation in OR  Informed Consent: I have reviewed the patients History and Physical, chart, labs and discussed the procedure including the risks, benefits and alternatives for the proposed anesthesia with the patient or authorized representative who has indicated his/her understanding and acceptance.   Dental advisory given  Plan Discussed with: CRNA, Anesthesiologist and Surgeon  Anesthesia Plan Comments:         Anesthesia Quick Evaluation

## 2012-06-11 NOTE — Anesthesia Postprocedure Evaluation (Signed)
  Anesthesia Post-op Note  Patient: Monica Ballard  Procedure(s) Performed: Procedure(s) (LRB) with comments: LAPAROSCOPY OPERATIVE (N/A) DILATATION AND CURETTAGE (N/A) - sec procedure start  1241  Patient Location: PACU  Anesthesia Type:General  Level of Consciousness: awake, alert  and oriented  Airway and Oxygen Therapy: Patient Spontanous Breathing  Post-op Pain: none  Post-op Assessment: Post-op Vital signs reviewed, Patient's Cardiovascular Status Stable, Respiratory Function Stable, Patent Airway and Pain level controlled  Post-op Vital Signs: Reviewed and stable  Complications: No apparent anesthesia complications

## 2012-06-11 NOTE — H&P (View-Only) (Signed)
05/28/2012  History of Present Illness  General:  36 y/o G3P2A1 presents to discuss further management of menometrorrhagia and pelvic pain. Pt had an incomplete abortion which required suction D&C 4-5 months ago. Since that time she has had heavy irregular menses that are painful. Pt has taken Vicoden several times daily, which moderately controls the pain. Pt has a h/o similar symptoms a few years ago but a work up with [facility] was never completed. Started Fluroiquinolone and Flagyl for possible endometritis with some mild improvement. Clear vaginal discharge currently, no bleeding.   Current Medications  Xopenex HFA 45 MCG/ACT Aerosol 2 puffs every 6 hrs  Celexa 20 MG Tablet 1 tablet Once a day  Klonopin 1 MG Tablet 1 tablet three times a day  Ibuprofen 600 MG Tablet 1 tablet every 6 hrs prn  Boric Acid tablets as directed once a day as needed  Vicodin 5-500 MG Tablet 1-2 tablets as needed for pain every 4- 6 hrs prn pain  Integra 62.5-62.5-40-3 MG Capsule as directed One tablet daily  Flagyl 500 MG Tablet 1 tablet Two times a day  Levaquin 500 MG Tablet 1 tablet Once a day  Medication List reviewed and reconciled with the patient   Past Medical History  normal PFTs February 2011, she has been told that she had COPD in the past due to hyperinflation seen on chest x-ray  anxiety/depression-has tried multiple medications including Cymbalta, Effexor, Lexapro, Paxil, Prozac, Wellbutrin, Zoloft  Menorrhagia, pelvic pain  last Pap January 2012 normal, endometrial biopsy performed at the same time shows normal endometrium   Surgical History  cholecystectomy   eardrum repair twice   D&C 2013   Family History  Father: alive 60 yrs HTN   Mother: deceased 47 yrs CAD, HTN, mitral valve prolapse   Paternal Grand Mother: colon cancer   Brother 1: alive 22 yrs   Sister 1: alive 22 yrs cervical cancer, hep C   Sister 2: alive 35 yrs anxiety, depression   Sister 3: alive 39 yrs von willebrand     one more sister 27 anxiety and depression.   Social History  General:  History of smoking  cigarettes: Current smoker Frequency: 1/2 PPD Estimated Pack-years: 10 Smoking: less than 1/2 pk/day, weaning down; started 2003.  no Alcohol.  Caffeine: yes, 7-8 Mt Dew a day.  no Recreational drug use.  Diet: poor.  Exercise: NONE.  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  Marital Status: married.  Children: 2 children.    Gyn History  Periods : irregular.  LMP 04/12/12- first part of October and now is spotting.  Denies H/O Birth control .  Last pap smear date 05/07/12, ASCUS, neg HPV.  Denies H/O Last mammogram date .  Denies H/O Abnormal pap smear .  Menarche 13.    OB History  Number of pregnancies 3.  Pregnancy # 1 live birth, vaginal delivery.  Pregnancy # 2 live birth, vaginal delivery.  Pregnancy # 3 miscarriage.    Allergies  Penicillin  Advair Diskus: thrush  Tramadol: syncope   Hospitalization/Major Diagnostic Procedure  childbirth x 2    Review of Systems  No fever/chills, N/V/D. See HPI.   Vital Signs  Wt 143, Ht 67.25, BMI 22.23, Pulse sitting 80, BP sitting 120/74.   Physical Examination  GENERAL:  Patient appears in NAD, pleasant.  Build: well developed.  General Appearance: well-appearing, thin, poor dentitian.  Race: caucasian.  NECK:  ROM: normal.  Thyroid:   no thyromegaly, non tender.  LUNGS:  Breath sounds: clear to auscultation.  Dyspnea: no.  HEART:  Murmurs: none.  Rate: normal.  Rhythm: regular.  BREASTS:  Axilla: no palpable masses, no lymphadenopathy, nontender.  Breast Mass: no masses, dimpling, retraction, or nipple discharge , bilaterally.  Skin Change: unremarkable.  ABDOMEN:  General: no masses,tenderness,organomegaly, , BS normal, non distended.  FEMALE GENITOURINARY:  Adnexa: no mass, non tender.  Anus/perineum: normal, no lesions.  Cervix/ cuff: normal appearance ,  no lesions/discharge/bleeding, good pelvic support .  External genitalia: normal, no lesions, no skin discoloration.  Rectum: deferred.  Urethra: normal external meatus.  Uterus: normal size/shape/consistency, freely mobile, mildly tender.  Vagina: pink/moist mucosa, no lesions, no abnormal discharge, odorless.  Vulva: normal, no lesions, no skin discoloration.  EXTREMITIES:  Extremities no clubbing cyanosis or edema present.  NEUROLOGICAL:  gross motor and sensory grossly intact.  Orientation: alert and oriented x 3.     Assessments   1. Pre-op exam - V72.84 (Primary)   2. Pelvic pain in female - 625.9   3. Menorrhagia - 626.2   Treatment  1. Pelvic pain in female  Pt requiring narcotics round the clock. Recommended diagnostic laparoscopy to assess for causes of pain and menorrhagia, primarily to rule out endometriosis. Pt desires pregnancy so hysterectomy or ablation are not an option. Reviewed R/B/A of procedure, all questions answered, consent obtained.    Follow Up  2 Weeks post op    

## 2012-06-11 NOTE — Interval H&P Note (Signed)
History and Physical Interval Note:  06/11/2012 11:45 AM  Monica Ballard  has presented today for surgery, with the diagnosis of Pelvic Pain/Menorrhagia  The various methods of treatment have been discussed with the patient and family. After consideration of risks, benefits and other options for treatment, the patient has consented to  Procedure(s) (LRB) with comments: LAPAROSCOPY OPERATIVE (N/A) DILATATION AND CURETTAGE (N/A) as a surgical intervention .  The patient's history has been reviewed, patient examined, no change in status, stable for surgery.  I have reviewed the patient's chart and labs.  Questions were answered to the patient's satisfaction.    Discussed possible D&C if diagnostic laparoscopy shows normal abdomen and pelvis.  Dion Body, Harrold Fitchett

## 2012-06-11 NOTE — Preoperative (Signed)
Beta Blockers   Reason not to administer Beta Blockers:Not Applicable 

## 2012-06-11 NOTE — Brief Op Note (Signed)
06/11/2012  1:37 PM  PATIENT:  Monica Ballard  36 y.o. female  PRE-OPERATIVE DIAGNOSIS:  Pelvic Pain/Menorrhagia  POST-OPERATIVE DIAGNOSIS:  Pelvic Pain/Menorrhagia  PROCEDURE:  Procedure(s) (LRB) with comments: LAPAROSCOPY OPERATIVE (N/A) DILATATION AND CURETTAGE (N/A) - sec procedure start  1241, Hysteroscopy  SURGEON:  Surgeon(s) and Role:    * Geryl Rankins, MD - Primary    * Dorien Chihuahua. Richardson Dopp, MD - Assisting  PHYSICIAN ASSISTANT:  Dr. Richardson Dopp  ASSISTANTS: none   ANESTHESIA:   local and general  EBL:  Total I/O In: 1700 [I.V.:1700] Out: 160 [Urine:150; Blood:10]  BLOOD ADMINISTERED:none  DRAINS: Urinary Catheter (Foley)   LOCAL MEDICATIONS USED:  MARCAINE     SPECIMEN:  Source of Specimen:  Endometrial currettings, endocervical mass biopsy  DISPOSITION OF SPECIMEN:  PATHOLOGY  COUNTS:  YES  TOURNIQUET:  * No tourniquets in log *  DICTATION: .Other Dictation: Dictation Number [id number]  PLAN OF CARE: Discharge to home after PACU  PATIENT DISPOSITION:  PACU - hemodynamically stable.   Delay start of Pharmacological VTE agent (>24hrs) due to surgical blood loss or risk of bleeding: not applicable

## 2012-06-12 ENCOUNTER — Encounter (HOSPITAL_COMMUNITY): Payer: Self-pay | Admitting: Obstetrics and Gynecology

## 2012-06-12 NOTE — Op Note (Signed)
NAMEFREYA, Monica Ballard NO.:  [id number]  MEDICAL RECORD NO.:  [id number]  LOCATION:  [facility]                          FACILITY:  [facility]  PHYSICIAN:  Pieter Partridge, MD   DATE OF BIRTH:  05-02-1976  DATE OF PROCEDURE:  06/11/2012 DATE OF DISCHARGE:  06/11/2012                              OPERATIVE REPORT   PREOPERATIVE DIAGNOSIS:  Pelvic pain and menorrhagia.  POSTOPERATIVE DIAGNOSIS:  Pelvic pain and menorrhagia.  PROCEDURE:  Diagnostic laparoscopy with hysteroscopy, D and C.  SURGEON:  Pieter Partridge, MD  ASSISTANT:  Gerald Leitz, MD  ANESTHESIA:  Local and general.  IV FLUIDS:  In 1700, urine 150 mL out, clear, blood is 10.  BLOOD ADMINISTERED:  None.  URINE:  Foley catheter in place during the procedure, 0.25% Marcaine used for local.  SPECIMEN:  Endometrial curettings and endocervical mass biopsy to Pathology.  COMPLICATIONS:  None.  DISPOSITION:  To PACU, hemodynamically stable.  FINDINGS:  The patient had a normal abdominal cavity.  All structures were easily visualized, anteverted uterus noted with a few small subserosal fibroids.  Bilateral tubes and ovaries were normal.  There was a small area of adhesions of the colon to the left lower quadrant, but appeared to be physiologic.  The liver edge was normal.  There were no adhesions of the upper abdomen given the patient had a cholecystectomy prior.  Endometrial cavity appeared normal.  There was not a lot of tissue there.  I suspect was an endocervical cyst.  INDICATIONS:  Ms. Monica Ballard is a 36 year old, gravida 3, para 2, AB 1.  She had a miscarriage with suction D and C after failed Cytotec in June and subsequent menses after that became heavy with clots and pelvic pain, requiring Vicodin around the clock per the patient.  In that, the patient's uterus was tender on physical exam.  She was treated for endometritis, that helped to resolve the pain somewhat.  Because she was requiring Vicodin to  control the pain, she was counseled on proceeding with diagnostic laparoscopy to rule out endometriosis.  PROCEDURE IN DETAIL:  The patient was identified in the holding area. She was then taken to the operating room and placed in the dorsal supine position where she underwent general endotracheal anesthesia without complication.  She was then placed in the dorsal lithotomy position and prepped and draped in normal sterile fashion.  A Graves speculum was used to visualize the cervix and the Hulka manipulator was placed without difficulty.  Prior to placing the manipulator, bimanual exam was performed to confirm an anteverted uterus.  A small infraumbilical incision was made with a scalpel.  The 5-mm trocar was then advanced through the fascia easily.  Intraabdominal access was confirmed with the laparoscope.  The abdomen was insufflated. Initial exploration showed a very clean pelvis and upper abdomen.  To mobilize some of the structures to fully identify to get a thorough survey of the pelvis, a 5-mm port was placed at the midline in the suprapubic region and the trocar was inserted under direct visualization.  A blunt probe monitor was used then to explore the fallopian tubes and ovaries, and also to look  in the cul-de-sac and the left adnexa.  Once adequate survey of the pelvis and abdomen was completed, all instruments were removed from the abdomen under direct visualization. CO2 was expelled as much as possible.  The suprapubic incision was approximated with 4-0 Monocryl and a single subcutaneous interrupted stitch.  Same was done at that time, but did not like the way the edges came together, so the Monocryl was cut and the Dermabond was just applied at the umbilical incision.  Attention was then turned to the pelvis.  The Graves speculum was inserted again.  After the Hulka manipulator was removed, the cervix was easily dilated up to a 6 Hegar.  The hysteroscope was then  inserted and the findings above were noted.  With the biopsy forceps, this mass was then biopsied.  I initially thought it was a fibroid, but after biopsying it, it completely collapsed and it was probably the size of the biopsy forceps head, which means it was may be a few millimeters in size.  So once that was deflated, the hysteroscope was then removed and the curettage.  The uterus was sounded to 8 cm.  Curettage was performed of all quadrants and minimal tissue was obtained.  The single-tooth tenaculum was then removed from the anterior lip of the cervix and hemostasis was achieved with silver nitrate.  The cervical os was observed for any abnormal bleeding and it was hemostatic.  All instrument, sponge, and needle counts were correct x3.  The patient was awakened in the recovery room and taken to the PACU in stable condition.  She has Cipro and Flagyl preop due to penicillin allergy. She had SCDs on during the entire case.  Time-out was taken prior to inserting the uterine manipulator.  The patient tolerated the procedure well and went to recovery room in stable condition.     Pieter Partridge, MD     EBV/MEDQ  D:  06/11/2012  T:  06/12/2012  Job:  [id number]

## 2012-08-20 ENCOUNTER — Encounter (HOSPITAL_COMMUNITY): Payer: Self-pay | Admitting: Pharmacist

## 2012-08-25 ENCOUNTER — Other Ambulatory Visit: Payer: Self-pay | Admitting: Obstetrics and Gynecology

## 2012-08-25 ENCOUNTER — Encounter (HOSPITAL_COMMUNITY): Payer: Self-pay

## 2012-08-25 ENCOUNTER — Encounter (HOSPITAL_COMMUNITY)
Admission: RE | Admit: 2012-08-25 | Discharge: 2012-08-25 | Disposition: A | Discharge status: Home / Self Care | Payer: BC Managed Care – PPO | Source: Ambulatory Visit | Attending: Obstetrics and Gynecology | Admitting: Obstetrics and Gynecology

## 2012-08-25 HISTORY — DX: Dental caries, unspecified: K02.9

## 2012-08-25 HISTORY — DX: Unspecified hearing loss, left ear: H91.92

## 2012-08-25 LAB — SURGICAL PCR SCREEN: MRSA, PCR: NEGATIVE

## 2012-08-25 LAB — CBC
MCH: 30 pg (ref 26.0–34.0)
MCHC: 33.6 g/dL (ref 30.0–36.0)
Platelets: 267 10*3/uL (ref 150–400)
RBC: 4.46 MIL/uL (ref 3.87–5.11)
RDW: 14.6 % (ref 11.5–15.5)

## 2012-08-25 NOTE — Patient Instructions (Addendum)
   Your procedure is scheduled [id number] February 10th  Enter through the Main Entrance of [facility] at:6am Pick up the phone at the desk and dial [phone] and inform us of your arrival.  Please call this number if you have any problems the morning of surgery: [phone]  Remember: Do not eat or drink anything after midnight on Sunday Please take your celexa and clonazepam morning of surgery with sips of water. Bring your inhaler with you day of surgery  Do not wear jewelry, make-up, or FINGER nail polish No metal in your hair or on your body. Do not wear lotions, powders, perfumes. You may wear deodorant.  Please use your CHG wash as directed prior to surgery.  Do not shave anywhere for at least 12 hours prior to first CHG shower.  Do not bring valuables to the hospital. Contacts, dentures or bridgework may not be worn into surgery.  Leave suitcase in the car. After Surgery it may be brought to your room. For patients being admitted to the hospital, checkout time is 11:00am the day of discharge.

## 2012-09-01 ENCOUNTER — Encounter (HOSPITAL_COMMUNITY)
Admission: RE | Disposition: A | Discharge status: Home / Self Care | Payer: Self-pay | Source: Ambulatory Visit | Attending: Obstetrics and Gynecology

## 2012-09-01 ENCOUNTER — Encounter (HOSPITAL_COMMUNITY): Payer: Self-pay | Admitting: Anesthesiology

## 2012-09-01 ENCOUNTER — Ambulatory Visit (HOSPITAL_COMMUNITY)
Admission: RE | Admit: 2012-09-01 | Discharge: 2012-09-02 | Disposition: A | Discharge status: Home / Self Care | Payer: BC Managed Care – PPO | Source: Ambulatory Visit | Attending: Obstetrics and Gynecology | Admitting: Obstetrics and Gynecology

## 2012-09-01 ENCOUNTER — Other Ambulatory Visit: Payer: Self-pay | Admitting: Obstetrics and Gynecology

## 2012-09-01 ENCOUNTER — Encounter (HOSPITAL_COMMUNITY): Payer: Self-pay | Admitting: *Deleted

## 2012-09-01 ENCOUNTER — Ambulatory Visit (HOSPITAL_COMMUNITY): Payer: BC Managed Care – PPO | Admitting: Anesthesiology

## 2012-09-01 DIAGNOSIS — Z90710 Acquired absence of both cervix and uterus: Secondary | ICD-10-CM

## 2012-09-01 DIAGNOSIS — N946 Dysmenorrhea, unspecified: Secondary | ICD-10-CM | POA: Insufficient documentation

## 2012-09-01 DIAGNOSIS — N920 Excessive and frequent menstruation with regular cycle: Secondary | ICD-10-CM | POA: Insufficient documentation

## 2012-09-01 DIAGNOSIS — N938 Other specified abnormal uterine and vaginal bleeding: Secondary | ICD-10-CM | POA: Insufficient documentation

## 2012-09-01 DIAGNOSIS — Z23 Encounter for immunization: Secondary | ICD-10-CM | POA: Insufficient documentation

## 2012-09-01 DIAGNOSIS — N949 Unspecified condition associated with female genital organs and menstrual cycle: Secondary | ICD-10-CM | POA: Insufficient documentation

## 2012-09-01 HISTORY — PX: VAGINAL HYSTERECTOMY: SHX2639

## 2012-09-01 LAB — TYPE AND SCREEN

## 2012-09-01 SURGERY — HYSTERECTOMY, VAGINAL
Anesthesia: General | Site: Vagina | Wound class: Clean Contaminated

## 2012-09-01 MED ORDER — DOCUSATE SODIUM 100 MG PO CAPS
100.0000 mg | ORAL_CAPSULE | Freq: Two times a day (BID) | ORAL | Status: DC
  Filled 2012-09-01 (×4): qty 1

## 2012-09-01 MED ORDER — DEXAMETHASONE SODIUM PHOSPHATE 4 MG/ML IJ SOLN
INTRAMUSCULAR | Status: DC | PRN
  Administered 2012-09-01: 10 mg via INTRAVENOUS

## 2012-09-01 MED ORDER — ALBUTEROL SULFATE HFA 108 (90 BASE) MCG/ACT IN AERS
INHALATION_SPRAY | RESPIRATORY_TRACT | Status: DC | PRN
  Administered 2012-09-01: 2 via RESPIRATORY_TRACT

## 2012-09-01 MED ORDER — INFLUENZA VIRUS VACC SPLIT PF IM SUSP
0.5000 mL | INTRAMUSCULAR | Status: AC
Start: 2012-09-02 — End: 2012-09-02
  Administered 2012-09-02: 0.5 mL via INTRAMUSCULAR
  Filled 2012-09-01: qty 0.5

## 2012-09-01 MED ORDER — CITALOPRAM HYDROBROMIDE 20 MG PO TABS
20.0000 mg | ORAL_TABLET | Freq: Every day | ORAL | Status: DC
  Administered 2012-09-02: 20 mg via ORAL
  Filled 2012-09-01: qty 1

## 2012-09-01 MED ORDER — KETOROLAC TROMETHAMINE 30 MG/ML IJ SOLN
30.0000 mg | Freq: Four times a day (QID) | INTRAMUSCULAR | Status: DC | PRN
  Administered 2012-09-01 (×2): 30 mg via INTRAVENOUS
  Filled 2012-09-01 (×2): qty 1

## 2012-09-01 MED ORDER — ONDANSETRON HCL 4 MG/2ML IJ SOLN
INTRAMUSCULAR | Status: AC
  Filled 2012-09-01: qty 2

## 2012-09-01 MED ORDER — PNEUMOCOCCAL VAC POLYVALENT 25 MCG/0.5ML IJ INJ
0.5000 mL | INJECTION | Freq: Once | INTRAMUSCULAR | Status: AC
  Administered 2012-09-02: 0.5 mL via INTRAMUSCULAR
  Filled 2012-09-01: qty 0.5

## 2012-09-01 MED ORDER — GLYCOPYRROLATE 0.2 MG/ML IJ SOLN
INTRAMUSCULAR | Status: AC
  Filled 2012-09-01: qty 2

## 2012-09-01 MED ORDER — IBUPROFEN 600 MG PO TABS: 600.0000 mg | ORAL_TABLET | Freq: Four times a day (QID) | ORAL | Status: DC | PRN

## 2012-09-01 MED ORDER — PROPOFOL 10 MG/ML IV EMUL
INTRAVENOUS | Status: DC | PRN
  Administered 2012-09-01: 200 mg via INTRAVENOUS

## 2012-09-01 MED ORDER — LACTATED RINGERS IV SOLN
INTRAVENOUS | Status: DC
  Administered 2012-09-01: 10:00:00 via INTRAVENOUS

## 2012-09-01 MED ORDER — DOCUSATE SODIUM 100 MG PO CAPS
100.0000 mg | ORAL_CAPSULE | Freq: Two times a day (BID) | ORAL | Status: DC
  Administered 2012-09-01 – 2012-09-02 (×2): 100 mg via ORAL
  Filled 2012-09-01 (×2): qty 1

## 2012-09-01 MED ORDER — ONDANSETRON HCL 4 MG/2ML IJ SOLN: 4.0000 mg | Freq: Four times a day (QID) | INTRAMUSCULAR | Status: DC | PRN

## 2012-09-01 MED ORDER — KETOROLAC TROMETHAMINE 30 MG/ML IJ SOLN: 15.0000 mg | Freq: Once | INTRAMUSCULAR | Status: DC | PRN

## 2012-09-01 MED ORDER — METRONIDAZOLE IN NACL 5-0.79 MG/ML-% IV SOLN
INTRAVENOUS | Status: DC | PRN
  Administered 2012-09-01: 500 mg via INTRAVENOUS

## 2012-09-01 MED ORDER — DEXAMETHASONE SODIUM PHOSPHATE 10 MG/ML IJ SOLN
INTRAMUSCULAR | Status: AC
  Filled 2012-09-01: qty 1

## 2012-09-01 MED ORDER — SIMETHICONE 80 MG PO CHEW: 80.0000 mg | CHEWABLE_TABLET | Freq: Four times a day (QID) | ORAL | Status: DC | PRN

## 2012-09-01 MED ORDER — LIDOCAINE HCL (CARDIAC) 20 MG/ML IV SOLN
INTRAVENOUS | Status: AC
  Filled 2012-09-01: qty 5

## 2012-09-01 MED ORDER — HYDROMORPHONE HCL PF 1 MG/ML IJ SOLN: 0.2000 mg | INTRAMUSCULAR | Status: DC | PRN

## 2012-09-01 MED ORDER — NEOSTIGMINE METHYLSULFATE 1 MG/ML IJ SOLN
INTRAMUSCULAR | Status: AC
  Filled 2012-09-01: qty 1

## 2012-09-01 MED ORDER — IBUPROFEN 600 MG PO TABS
600.0000 mg | ORAL_TABLET | Freq: Four times a day (QID) | ORAL | Status: DC | PRN
  Administered 2012-09-02: 600 mg via ORAL
  Filled 2012-09-01: qty 1

## 2012-09-01 MED ORDER — MIDAZOLAM HCL 5 MG/5ML IJ SOLN
INTRAMUSCULAR | Status: DC | PRN
  Administered 2012-09-01: 2 mg via INTRAVENOUS

## 2012-09-01 MED ORDER — PROPOFOL 10 MG/ML IV EMUL
INTRAVENOUS | Status: AC
  Filled 2012-09-01: qty 20

## 2012-09-01 MED ORDER — PROMETHAZINE HCL 25 MG/ML IJ SOLN
6.2500 mg | INTRAMUSCULAR | Status: AC | PRN
  Administered 2012-09-01 (×2): 6.25 mg via INTRAVENOUS

## 2012-09-01 MED ORDER — SCOPOLAMINE 1 MG/3DAYS TD PT72
MEDICATED_PATCH | TRANSDERMAL | Status: AC
  Administered 2012-09-01: 1.5 mg via TRANSDERMAL
  Filled 2012-09-01: qty 1

## 2012-09-01 MED ORDER — FENTANYL CITRATE 0.05 MG/ML IJ SOLN
INTRAMUSCULAR | Status: AC
  Filled 2012-09-01: qty 5

## 2012-09-01 MED ORDER — CIPROFLOXACIN IN D5W 400 MG/200ML IV SOLN
INTRAVENOUS | Status: DC | PRN
  Administered 2012-09-01: 400 mg via INTRAVENOUS

## 2012-09-01 MED ORDER — ALBUTEROL SULFATE HFA 108 (90 BASE) MCG/ACT IN AERS: 2.0000 | INHALATION_SPRAY | Freq: Four times a day (QID) | RESPIRATORY_TRACT | Status: DC | PRN

## 2012-09-01 MED ORDER — METRONIDAZOLE IN NACL 5-0.79 MG/ML-% IV SOLN: 500.0000 mg | INTRAVENOUS | Status: DC

## 2012-09-01 MED ORDER — CIPROFLOXACIN IN D5W 400 MG/200ML IV SOLN
400.0000 mg | INTRAVENOUS | Status: DC
  Filled 2012-09-01: qty 200

## 2012-09-01 MED ORDER — METRONIDAZOLE IN NACL 5-0.79 MG/ML-% IV SOLN
500.0000 mg | INTRAVENOUS | Status: DC
  Filled 2012-09-01: qty 100

## 2012-09-01 MED ORDER — ROCURONIUM BROMIDE 50 MG/5ML IV SOLN
INTRAVENOUS | Status: AC
  Filled 2012-09-01: qty 1

## 2012-09-01 MED ORDER — HYDROMORPHONE HCL PF 1 MG/ML IJ SOLN
INTRAMUSCULAR | Status: AC
  Administered 2012-09-01: 0.5 mg via INTRAVENOUS
  Filled 2012-09-01: qty 1

## 2012-09-01 MED ORDER — ROCURONIUM BROMIDE 100 MG/10ML IV SOLN
INTRAVENOUS | Status: DC | PRN
  Administered 2012-09-01: 25 mg via INTRAVENOUS
  Administered 2012-09-01: 5 mg via INTRAVENOUS

## 2012-09-01 MED ORDER — MEPERIDINE HCL 25 MG/ML IJ SOLN: 6.2500 mg | INTRAMUSCULAR | Status: DC | PRN

## 2012-09-01 MED ORDER — HYDROMORPHONE HCL PF 1 MG/ML IJ SOLN
0.2000 mg | INTRAMUSCULAR | Status: DC | PRN
  Administered 2012-09-01: 0.5 mg via INTRAVENOUS
  Filled 2012-09-01: qty 1

## 2012-09-01 MED ORDER — LACTATED RINGERS IV SOLN
INTRAVENOUS | Status: DC
  Administered 2012-09-01: 07:00:00 via INTRAVENOUS

## 2012-09-01 MED ORDER — SCOPOLAMINE 1 MG/3DAYS TD PT72
1.0000 | MEDICATED_PATCH | TRANSDERMAL | Status: DC
  Administered 2012-09-01: 1.5 mg via TRANSDERMAL

## 2012-09-01 MED ORDER — FENTANYL CITRATE 0.05 MG/ML IJ SOLN
INTRAMUSCULAR | Status: DC | PRN
  Administered 2012-09-01 (×7): 50 ug via INTRAVENOUS

## 2012-09-01 MED ORDER — INFLUENZA VIRUS VACC SPLIT PF IM SUSP: 0.5000 mL | INTRAMUSCULAR | Status: DC

## 2012-09-01 MED ORDER — HYDROMORPHONE HCL PF 1 MG/ML IJ SOLN
0.2500 mg | INTRAMUSCULAR | Status: DC | PRN
  Administered 2012-09-01 (×4): 0.5 mg via INTRAVENOUS

## 2012-09-01 MED ORDER — METRONIDAZOLE IN NACL 5-0.79 MG/ML-% IV SOLN: 500.0000 mg | Freq: Once | INTRAVENOUS | Status: DC

## 2012-09-01 MED ORDER — PROMETHAZINE HCL 25 MG/ML IJ SOLN
INTRAMUSCULAR | Status: AC
  Administered 2012-09-01: 6.25 mg via INTRAVENOUS
  Filled 2012-09-01: qty 1

## 2012-09-01 MED ORDER — ONDANSETRON HCL 4 MG/2ML IJ SOLN
INTRAMUSCULAR | Status: DC | PRN
  Administered 2012-09-01: 4 mg via INTRAVENOUS

## 2012-09-01 MED ORDER — BUPIVACAINE-EPINEPHRINE PF 0.25-1:200000 % IJ SOLN
INTRAMUSCULAR | Status: AC
  Filled 2012-09-01: qty 30

## 2012-09-01 MED ORDER — PNEUMOCOCCAL VAC POLYVALENT 25 MCG/0.5ML IJ INJ
0.5000 mL | INJECTION | Freq: Once | INTRAMUSCULAR | Status: DC
  Filled 2012-09-01: qty 0.5

## 2012-09-01 MED ORDER — LACTATED RINGERS IV SOLN
INTRAVENOUS | Status: DC | PRN
  Administered 2012-09-01 (×3): via INTRAVENOUS

## 2012-09-01 MED ORDER — CLONAZEPAM 0.5 MG PO TABS
1.0000 mg | ORAL_TABLET | Freq: Two times a day (BID) | ORAL | Status: DC | PRN
  Administered 2012-09-02: 1 mg via ORAL
  Filled 2012-09-01 (×2): qty 1

## 2012-09-01 MED ORDER — MIDAZOLAM HCL 2 MG/2ML IJ SOLN
INTRAMUSCULAR | Status: AC
  Filled 2012-09-01: qty 2

## 2012-09-01 MED ORDER — VASOPRESSIN 20 UNIT/ML IJ SOLN
INTRAVENOUS | Status: DC | PRN
  Administered 2012-09-01: 08:00:00 via INTRAMUSCULAR

## 2012-09-01 MED ORDER — ONDANSETRON HCL 4 MG PO TABS: 4.0000 mg | ORAL_TABLET | Freq: Four times a day (QID) | ORAL | Status: DC | PRN

## 2012-09-01 MED ORDER — VASOPRESSIN 20 UNIT/ML IJ SOLN
INTRAMUSCULAR | Status: AC
  Filled 2012-09-01: qty 1

## 2012-09-01 MED ORDER — LIDOCAINE HCL (CARDIAC) 20 MG/ML IV SOLN
INTRAVENOUS | Status: DC | PRN
  Administered 2012-09-01: 60 mg via INTRAVENOUS

## 2012-09-01 MED ORDER — CIPROFLOXACIN IN D5W 400 MG/200ML IV SOLN: 400.0000 mg | INTRAVENOUS | Status: DC

## 2012-09-01 MED ORDER — FENTANYL CITRATE 0.05 MG/ML IJ SOLN
INTRAMUSCULAR | Status: AC
  Filled 2012-09-01: qty 2

## 2012-09-01 MED ORDER — GLYCOPYRROLATE 0.2 MG/ML IJ SOLN
INTRAMUSCULAR | Status: DC | PRN
  Administered 2012-09-01 (×2): 0.1 mg via INTRAVENOUS

## 2012-09-01 MED ORDER — HYDROCODONE-ACETAMINOPHEN 5-325 MG PO TABS
1.0000 | ORAL_TABLET | ORAL | Status: DC | PRN
Start: 2012-09-01 — End: 2012-09-02
  Administered 2012-09-01 – 2012-09-02 (×3): 2 via ORAL
  Filled 2012-09-01 (×3): qty 2

## 2012-09-01 MED ORDER — HYDROCODONE-ACETAMINOPHEN 5-325 MG PO TABS: 1.0000 | ORAL_TABLET | ORAL | Status: DC | PRN

## 2012-09-01 SURGICAL SUPPLY — 21 items
CANISTER SUCTION 2500CC (MISCELLANEOUS) ×2 IMPLANT
CLOTH BEACON ORANGE TIMEOUT ST (SAFETY) ×2 IMPLANT
CONT PATH 16OZ SNAP LID 3702 (MISCELLANEOUS) IMPLANT
DRAPE STERI URO 9X17 APER PCH (DRAPES) ×2 IMPLANT
DRESSING TELFA 8X3 (GAUZE/BANDAGES/DRESSINGS) ×2 IMPLANT
ELECT LIGASURE SHORT 9 REUSE (ELECTRODE) ×1 IMPLANT
GLOVE BIO SURGEON STRL SZ7 (GLOVE) ×4 IMPLANT
GLOVE BIOGEL PI IND STRL 7.0 (GLOVE) ×1 IMPLANT
GLOVE BIOGEL PI INDICATOR 7.0 (GLOVE) ×1
GOWN STRL REIN XL XLG (GOWN DISPOSABLE) ×8 IMPLANT
NS IRRIG 1000ML POUR BTL (IV SOLUTION) ×2 IMPLANT
PACK VAGINAL WOMENS (CUSTOM PROCEDURE TRAY) ×2 IMPLANT
PAD OB MATERNITY 4.3X12.25 (PERSONAL CARE ITEMS) ×2 IMPLANT
SUT CHROMIC 2 0 CT 1 (SUTURE) ×1 IMPLANT
SUT VIC AB 0 CT1 18XCR BRD8 (SUTURE) ×3 IMPLANT
SUT VIC AB 0 CT1 36 (SUTURE) ×4 IMPLANT
SUT VIC AB 0 CT1 8-18 (SUTURE) ×6
SUT VICRYL 0 TIES 12 18 (SUTURE) ×2 IMPLANT
TOWEL OR 17X24 6PK STRL BLUE (TOWEL DISPOSABLE) ×4 IMPLANT
TRAY FOLEY CATH 14FR (SET/KITS/TRAYS/PACK) ×2 IMPLANT
WATER STERILE IRR 1000ML POUR (IV SOLUTION) ×2 IMPLANT

## 2012-09-01 NOTE — H&P (Signed)
08/25/2012  History of Present Illness  General:  37 y/o G3P2A1 presents for preop exam for TVH on 09/01/12. Pt with menorrhagia and dysmenorrhea. S/p Diagnostic Laparoscopy and D&C to rule out endometriosis 2 months ago. Laparoscopy was negative for endometriosis and adhesions.  Pt states her period started 5 days ago. Pt reports excruitiig pain unrelieved by Ibuprofen. Gets chills and hot flashes when pain is intense. Has difficulty walking. Requests stronger pain meds. Can take Toradol. Consents to urine drug screen prior to rx of narcotics.   Current Medications  Ibuprofen 600 MG Tablet 1 tablet every 6 hrs x 48 hours prior to menses then every 6 hours during menses  Celexa 20 MG Tablet 1 tablet Once a day  Klonopin 1 MG Tablet 1 tablet three times a day  Xopenex HFA 45 MCG/ACT Aerosol 2 puffs every 6 hrs  Medication List reviewed and reconciled with the patient   Past Medical History  normal PFTs February 2011, she has been told that she had COPD in the past due to hyperinflation seen on chest x-ray  anxiety/depression-has tried multiple medications including Cymbalta, Effexor, Lexapro, Paxil, Prozac, Wellbutrin, Zoloft  Menorrhagia, pelvic pain  last Pap January 2012 normal, endometrial biopsy performed at the same time shows normal endometrium   Surgical History  cholecystectomy   eardrum repair twice   D&C 2013   Family History  Father: alive 77 yrs HTN   Mother: deceased 31 yrs CAD, HTN, mitral valve prolapse   Paternal Grand Mother: colon cancer   Brother 1: alive 22 yrs   Sister 1: alive 22 yrs cervical cancer, hep C   Sister 2: alive 35 yrs anxiety, depression   Sister 3: alive 25 yrs von willebrand   one more sister 56 anxiety and depression.   Social History  General:  History of smoking  cigarettes: Current smoker Frequency: 1/2 PPD Estimated Pack-years: 10 Smoking: less than 1/2 pk/day, weaning down; started 2003.  no Alcohol.  Caffeine: yes, 7-8 Mt Dew a  day.  no Recreational drug use.  Diet: poor.  Exercise: NONE.  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  Marital Status: married.  Children: 2 children.    Gyn History  Sexual activity currently sexually active.  Periods : irregular.  LMP 08/22/12.  Denies H/O Birth control .  Last pap smear date 05/07/12, ASCUS, neg HPV.  Denies H/O Last mammogram date .  Denies H/O Abnormal pap smear .  Menarche 13.    OB History  Number of pregnancies 3.  Pregnancy # 1 live birth, vaginal delivery.  Pregnancy # 2 live birth, vaginal delivery.  Pregnancy # 3 miscarriage.    Allergies  Penicillin  Advair Diskus: thrush  Tramadol: syncope   Hospitalization/Major Diagnostic Procedure  childbirth x 2    Review of Systems  See HPI. No SOB, dizziness, chest pain, nausea, vomiting.   Vital Signs  Wt 145, Wt change 2 lb, Ht 67.25, BMI 22.54, Temp 98.7, Pulse sitting 58, BP sitting 112/68.   Physical Examination  GENERAL:  Patient appears uncomfortable.  Build: well developed.  General Appearance: unremarkable, poor dentitian.  Race: caucasian.  NECK:  ROM: normal.  Thyroid: no thyromegaly, non tender.  LUNGS:  Breath sounds: clear to auscultation.  Dyspnea: no.  HEART:  Murmurs: none.  Rate: normal.  Rhythm: regular.  ABDOMEN:  General: no masses,tenderness,organomegaly, , BS normal, non distended.  FEMALE GENITOURINARY:  Adnexa: no mass, non tender.  Anus/perineum: normal,  no lesions.  Cervix/ cuff: normal appearance , no lesions/discharge/bleeding, good pelvic support .  External genitalia: normal, no lesions, no skin discoloration.  Rectum: deferred.  Urethra: normal external meatus.  Uterus: normal size/shape/consistency, freely mobile, good descent, moderately tender to palpation.  Vagina: pink/moist mucosa, no lesions, no abnormal discharge, min-moderate blood in vault. No clots.  Vulva: normal, no lesions, no skin  discoloration.  EXTREMITIES:  Extremities no clubbing cyanosis or edema present.  NEUROLOGICAL:  gross motor and sensory grossly intact.  Orientation: alert and oriented x 3.     Assessments   1. Pre-op exam - V72.84 (Primary)   2. Dysmenorrhea - 625.3, Proceed with TVH.    3. Menorrhagia - 626.2   Treatment  1. Dysmenorrhea  Start Ketorolac Tromethamine Tablet, 10 MG, 1 tablet, Orally, every 6 hours prn pain, 5 days, 10, Refills 0 LAB: Drug Abuse Profile (10 Drugs), Screen/Conf, Serum (161096) LAB: [facility] Chlamydia/Gonorrhea Amplification (045409)   Chlamydia trachomatis, NAA NEGATIVE NEGATIVE -   Neisseria gonorrhoeae, NAA NEGATIVE NEGATIVE -   Please note: COMMENT -    Pt left prior to getting Toradol or drug screen. Pt counseled on R/B/A of procedure, including but not limited to injury to ureter, bladder, bowel. Declines ablation, IUD. Pt understands this will make her sterile and she will not be able to have any children. Informed consent obtained.    Follow Up  2 Weeks post op

## 2012-09-01 NOTE — Anesthesia Postprocedure Evaluation (Signed)
  Anesthesia Post-op Note  Anesthesia Post Note  Patient: Monica Ballard  Procedure(s) Performed: Procedure(s) (LRB): HYSTERECTOMY VAGINAL (N/A)  Anesthesia type: General  Patient location: PACU  Post pain: Pain level controlled  Post assessment: Post-op Vital signs reviewed  Last Vitals:  Filed Vitals:   09/01/12 1030  BP: 131/67  Pulse: 61  Temp: 36.9 C  Resp: 16    Post vital signs: Reviewed  Level of consciousness: sedated  Complications: No apparent anesthesia complications

## 2012-09-01 NOTE — Anesthesia Preprocedure Evaluation (Addendum)
Anesthesia Evaluation  Patient identified by MRN, date of birth, ID band Patient awake    Reviewed: Allergy & Precautions, H&P , NPO status , Patient's Chart, lab work & pertinent test results  History of Anesthesia Complications (+) PONV  Airway Mallampati: I TM Distance: >3 FB Neck ROM: full    Dental  (+) Teeth Intact and Poor Dentition   Pulmonary asthma , Current Smoker,    Pulmonary exam normal       Cardiovascular negative cardio ROS      Neuro/Psych PSYCHIATRIC DISORDERS Anxiety Depression    GI/Hepatic negative GI ROS, Neg liver ROS,   Endo/Other  negative endocrine ROS  Renal/GU negative Renal ROS  negative genitourinary   Musculoskeletal negative musculoskeletal ROS (+)   Abdominal Normal abdominal exam  (+)   Peds negative pediatric ROS (+)  Hematology negative hematology ROS (+)   Anesthesia Other Findings   Reproductive/Obstetrics negative OB ROS                           Anesthesia Physical Anesthesia Plan  ASA: II  Anesthesia Plan: General   Post-op Pain Management:    Induction: Intravenous  Airway Management Planned: Oral ETT  Additional Equipment:   Intra-op Plan:   Post-operative Plan: Extubation in OR  Informed Consent: I have reviewed the patients History and Physical, chart, labs and discussed the procedure including the risks, benefits and alternatives for the proposed anesthesia with the patient or authorized representative who has indicated his/her understanding and acceptance.   Dental Advisory Given  Plan Discussed with: CRNA and Surgeon  Anesthesia Plan Comments:         Anesthesia Quick Evaluation

## 2012-09-01 NOTE — Transfer of Care (Signed)
Immediate Anesthesia Transfer of Care Note  Patient: Monica Ballard  Procedure(s) Performed: Procedure(s): HYSTERECTOMY VAGINAL (N/A)  Patient Location: PACU  Anesthesia Type:General  Level of Consciousness: awake, alert , oriented and patient cooperative  Airway & Oxygen Therapy: Patient Spontanous Breathing and Patient connected to nasal cannula oxygen  Post-op Assessment: Report given to PACU RN and Post -op Vital signs reviewed and stable  Post vital signs: Reviewed and stable  Complications: No apparent anesthesia complications and Patient re-intubated

## 2012-09-01 NOTE — Anesthesia Postprocedure Evaluation (Signed)
  Anesthesia Post-op Note  Patient: Monica Ballard  Procedure(s) Performed: Procedure(s): HYSTERECTOMY VAGINAL (N/A)  [facility]: [facility]  Anesthesia Type:General  Level of Consciousness: awake, alert  and oriented  Airway and Oxygen Therapy: Patient Spontanous Breathing  Post-op Pain: none  Post-op Assessment: Post-op Vital signs reviewed  Post-op Vital Signs: Reviewed and stable  Complications: No apparent anesthesia complications

## 2012-09-01 NOTE — Interval H&P Note (Signed)
History and Physical Interval Note:  09/01/2012 7:22 AM  Monica Ballard  has presented today for surgery, with the diagnosis of Menorrhagia/Dysmenorrhea CPT [phone]  The various methods of treatment have been discussed with the patient and family. After consideration of risks, benefits and other options for treatment, the patient has consented to  Procedure(s): HYSTERECTOMY VAGINAL (N/A) as a surgical intervention .  The patient's history has been reviewed, patient examined, no change in status, stable for surgery.  I have reviewed the patient's chart and labs.  Questions were answered to the patient's satisfaction.    Heavy LMP on 08/22/12.  "Old blood today."  [name]

## 2012-09-01 NOTE — Brief Op Note (Signed)
09/01/2012  12:30 PM  PATIENT:  Monica Ballard  37 y.o. female  PRE-OPERATIVE DIAGNOSIS:  Menorrhagia/Dysmenorrhea CPT [phone]  POST-OPERATIVE DIAGNOSIS:  Menorrhagia/Dysmenorrhea  PROCEDURE:  Procedure(s): HYSTERECTOMY VAGINAL (N/A) Bilateral salpingectomy. SURGEON:  Surgeon(s) and Role:    * Geryl Rankins, MD - Primary    * Dorien Chihuahua. Richardson Dopp, MD - Assisting  PHYSICIAN ASSISTANT: Dr. Gerald Leitz  ASSISTANTS: Technician   ANESTHESIA:   general  EBL:  Total I/O In: 2500 [I.V.:2500] Out: 1225 [Urine:1175; Blood:50]  BLOOD ADMINISTERED:none  DRAINS: Urinary Catheter (Foley)   LOCAL MEDICATIONS USED:  OTHER Vasopressin  SPECIMEN:  Source of Specimen:  Uterus, bilateral fallopian tubes  DISPOSITION OF SPECIMEN:  PATHOLOGY  COUNTS:  YES  TOURNIQUET:  * No tourniquets in log *  DICTATION: .Other Dictation: Dictation Number [id number]?   PLAN OF CARE: Admit for overnight observation  PATIENT DISPOSITION:  PACU - hemodynamically stable.   Delay start of Pharmacological VTE agent (>24hrs) due to surgical blood loss or risk of bleeding: not applicable

## 2012-09-02 ENCOUNTER — Encounter (HOSPITAL_COMMUNITY): Payer: Self-pay | Admitting: Obstetrics and Gynecology

## 2012-09-02 LAB — CBC
HCT: 34.9 % — ABNORMAL LOW (ref 36.0–46.0)
Hemoglobin: 11.4 g/dL — ABNORMAL LOW (ref 12.0–15.0)
MCH: 29.8 pg (ref 26.0–34.0)
MCHC: 32.7 g/dL (ref 30.0–36.0)
RBC: 3.82 MIL/uL — ABNORMAL LOW (ref 3.87–5.11)

## 2012-09-02 MED ORDER — DSS 100 MG PO CAPS: 100.0000 mg | ORAL_CAPSULE | Freq: Two times a day (BID) | ORAL | Status: DC

## 2012-09-02 MED ORDER — IBUPROFEN 800 MG PO TABS: 800.0000 mg | ORAL_TABLET | Freq: Three times a day (TID) | ORAL | Status: DC | PRN

## 2012-09-02 MED ORDER — OXYCODONE-ACETAMINOPHEN 5-325 MG PO TABS: 1.0000 | ORAL_TABLET | Freq: Four times a day (QID) | ORAL | Status: DC | PRN

## 2012-09-02 NOTE — Progress Notes (Signed)
Pt discharged to home with husband.  Condition stable.  Pt ambulated to care with RN.  No equipment for home ordered at discharge

## 2012-09-02 NOTE — Discharge Summary (Signed)
Physician Discharge Summary  Patient ID: Monica Ballard MRN: [id number] DOB/AGE: 12-18-1975 36 y.o.  Admit date: 09/01/2012 Discharge date: 09/02/2012  Admission Diagnoses:abnormal uterine bleeding  Discharge Diagnoses: Same   Active Problems:   * No active hospital problems. *   Discharged Condition: good  Hospital Course: admitted for observation after vaginal hysterectomy. She did well postoperatively. HGB on pod #1 was 11.4  Consults: None  Significant Diagnostic Studies: labs: hgb 11.4  Treatments: surgery: vaginal hysterectomy   Discharge Exam: Blood pressure 111/57, pulse 62, temperature 98 F (36.7 C), temperature source Oral, resp. rate 18, height 5\' 7"  (1.702 m), weight 64.864 kg (143 lb), last menstrual period 10/31/2011, SpO2 100.00%. General appearance: alert and cooperative GI: soft appropriately tender nondistended +BS in all 4 quadrants  Extremities: extremities normal, atraumatic, no cyanosis or edema  Disposition: 01-Home or Self Care  Discharge Orders   Future Orders Complete By Expires     Call MD for:  persistant nausea and vomiting  As directed     Call MD for:  severe uncontrolled pain  As directed     Call MD for:  temperature >100.4  As directed     Diet general  As directed     Driving Restrictions  As directed     Comments:      Avoid driving for 1 week    Increase activity slowly  As directed     Lifting restrictions  As directed     Comments:      Do not lift over 10 lbs    Sexual Activity Restrictions  As directed     Comments:      Avoid sex for 8 weeks        Medication List    TAKE these medications       albuterol 108 (90 BASE) MCG/ACT inhaler  Commonly known as:  PROVENTIL HFA;VENTOLIN HFA  Inhale 2 puffs into the lungs every 6 (six) hours as needed. For shortness of breath.     citalopram 20 MG tablet  Commonly known as:  CELEXA  Take 20 mg by mouth daily.     clonazePAM 1 MG tablet  Commonly known as:  KLONOPIN  Take  1 mg by mouth 2 (two) times daily. Can take up to three times daily, but usually only takes twice a day     DSS 100 MG Caps  Take 100 mg by mouth 2 (two) times daily.     ibuprofen 800 MG tablet  Commonly known as:  ADVIL,MOTRIN  Take 1 tablet (800 mg total) by mouth every 8 (eight) hours as needed for pain. For pain     multivitamin with minerals Tabs  Take 1 tablet by mouth daily.     oxyCODONE-acetaminophen 5-325 MG per tablet  Commonly known as:  ROXICET  Take 1-2 tablets by mouth every 6 (six) hours as needed for pain.           Follow-up Information   Follow up with Geryl Rankins, MD. Schedule an appointment as soon as possible for a visit in 2 weeks. (post op check)    Contact information:   [address], [address] [place] [address] [phone]       Signed: Jessee Avers. 09/02/2012, 10:26 AM

## 2012-09-02 NOTE — Op Note (Signed)
NAMEFANNIE, Ballard NO.:  [id number]  MEDICAL RECORD NO.:  [id number]  LOCATION:  9305                          FACILITY:  [facility]  PHYSICIAN:  Pieter Partridge, MD   DATE OF BIRTH:  08-12-1975  DATE OF PROCEDURE:  09/01/2012 DATE OF DISCHARGE:                              OPERATIVE REPORT   PREOPERATIVE DIAGNOSIS:  Menorrhagia and dysmenorrhea.  POSTOPERATIVE DIAGNOSIS:  Menorrhagia and dysmenorrhea.  PROCEDURE:  Hysterectomy or TVH, and bilateral salpingectomy.  SURGEON:  Pieter Partridge, MD  ASSISTANT:  Gerald Leitz, MD and technician.  ANESTHESIA:  General.  ESTIMATED BLOOD LOSS:  50 mL.  BLOOD ADMINISTERED:  None.  DRAINS:  Foley catheter.  LOCAL:  Vasopressin 20 units and sodium bicarb.  SPECIMEN:  Uterus with cervix and bilateral fallopian tubes.  SPECIMEN TO PATHOLOGY:  None.  PLAN OF CARE:  Overnight observation.  DISPOSITION:  To PACU hemodynamically stable.  COMPLICATION:  None.  FINDINGS:  Small uterus, small 1 cm serosal fibroid anteriorly noted. Normal ovaries bilaterally.  Normal fallopian tubes bilaterally.  DESCRIPTION OF PROCEDURE:  The patient was identified in the holding area.  She was then taken to the operating room, where she underwent general endotracheal anesthesia without complication.  She was then placed in the dorsal lithotomy position and prepped and draped in a normal sterile fashion.  A weighted speculum was placed in the vagina.  Deaver was used to retract the anterior vagina and the cervix was visualized.  It was then grasped with two single-tooth tenaculum.  Vasopressin was then injected paracervically in the vaginal mucosa.  The Bovie was then used to incise the vaginal mucosa down to the pelvic fascia.  Attention was turned to the posterior portion of the uterus.  The posterior cul-de-sac was entered very easily and there was a moderate amount of blood noted when that was entered, but it was sanguinous.   The long duckbill weighted speculum was then inserted.  I attempted to get in anteriorly, almost got the peritoneum, however, I got the uterosacral ligaments with a Haney clamps bilaterally first.  They were grasped with Haney clamps, and cut with curved Mayo's, and suture ligated with 0 Vicryl and tagged.  The anterior cul-de-sac was then entered safely without any injury to the bladder.  Once that was entered, the narrow Deaver was placed, and I attempted to get the right uterine artery with the ligature, however, there was some technical difficulties with it.  So, the handpiece was not plugged into the machine all the way and it was not activating. However, on that left-hand side that pedicle was grasped with a Heaney clamp and transected with the Mayo's and suture ligated with 0 Vicryl Heaney stitch.  The LigaSure was then used on the right side and the uterine artery with the Bovie with adequate hemostasis.  Once I was near the utero-ovarian ligament, the single-tooth tenaculum was used to grasp the fundus of the uterus and the LigaSure was then placed and the utero- ovarian ligament was transected.  I had gotten all of the pedicle except for the fallopian tubes on that left side, it was still hanging and we eventually  used the [place] to grasp the fallopian tube, and the fallopian tubes were brought down and the LigaSure was used to transect those.  Once that left fallopian tube was transected, there was a little bit of bleeding and the LigaSure was used for hemostasis in that area. The same was done on the opposite side.  The utero-ovarian ligament was transected and the LigaSure was placed and I just did a freehand tie on a pass to hold on to the pedicle.  The fallopian tube was then identified and brought down with a [place] and was transected with the LigaSure.  This pedicle was hemostatic and was evaluated thoroughly. The other side was also evaluated for hemostasis.  Of  note, the bowel was posterior to the LigaSure, however, in an effort to stay clear away from it, I attempted to put in a 4 x 18 moist laparotomy sponge, but it would not fit into the pelvis.  Her side walls were narrow and her arch was little bit narrow.  So, in an effort to retract the bowel, the tips were visualized at all times and the handle of the forceps were used to see the bowel.  The uterosacrals were then plicated to the posterior cuff.  I attempted to close the peritoneum with 2-0 chromic in a purse stitch fashion, and when I tied down the chromic it broke, so I aborted that procedure because it had been reapproximated.  The cuff was then closed with 0 Vicryl in a continuous locked fashion, a second layer of 2-0 Vicryl was then used for imbrication.  The cuff was hemostatic.  There was no bleeding noted.  So, vag pack was not placed.  All instrument, sponge, and needle counts were correct x3.  The patient tolerated the procedure well.  She received Cipro and Flagyl prior to the procedure.  SCDs were on the operating room.  She tolerated the procedure well.     Pieter Partridge, MD     EBV/MEDQ  D:  09/01/2012  T:  09/02/2012  Job:  [id number]

## 2013-12-18 IMAGING — US US OB TRANSVAGINAL
1 series · 13 of 28 positions shown · non-contrast
Comparison: none

[Series 1: us ob comp less 14 wks · 41 acquisitions, 13 frames shown]
[im 2/41]
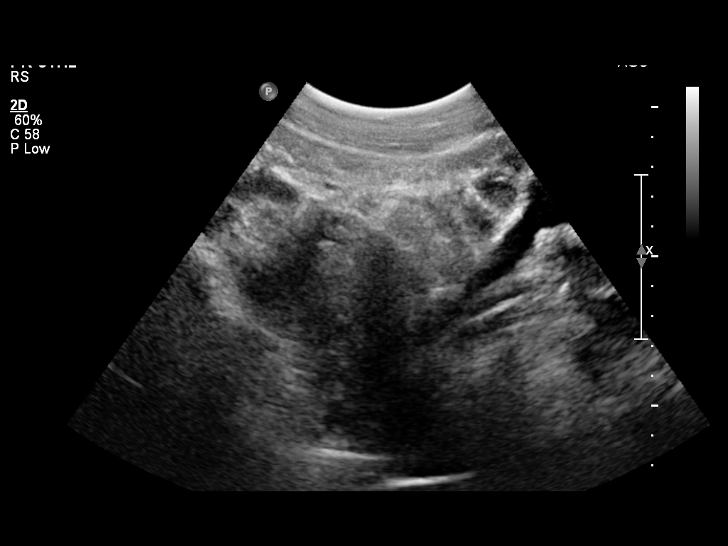
[im 5/41]
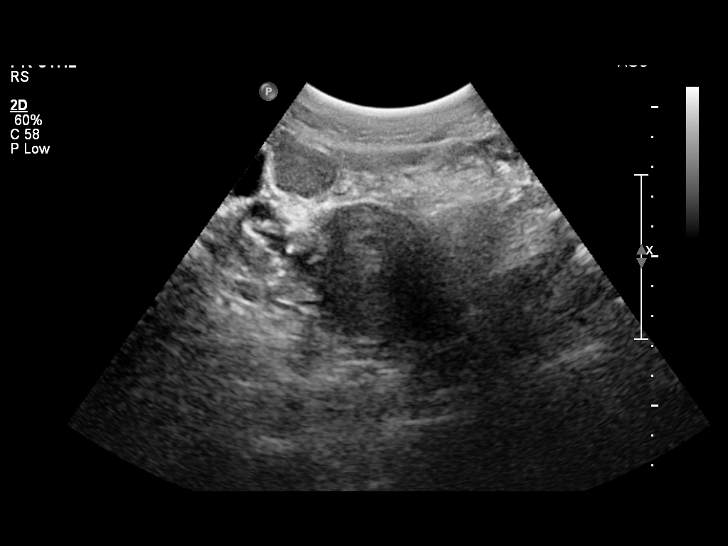
[im 8/41]
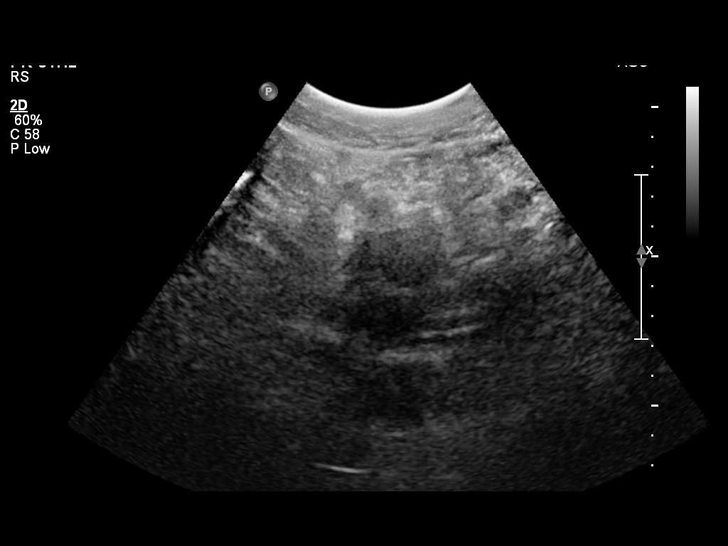
[im 11/41]
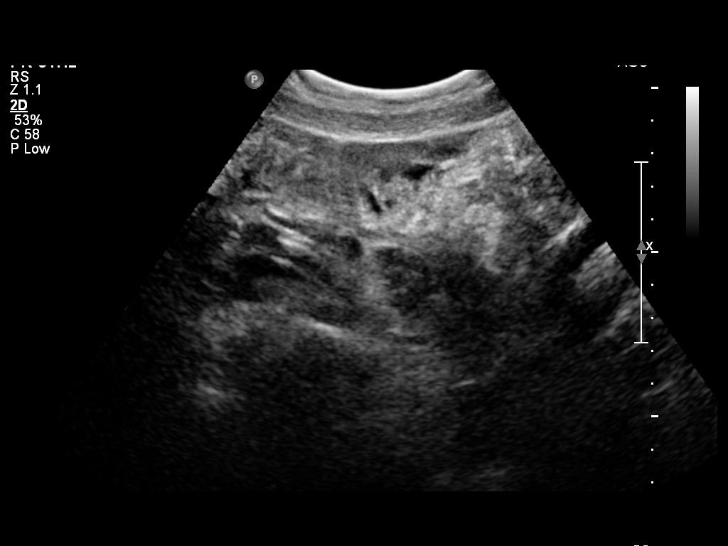
[im 14/41]
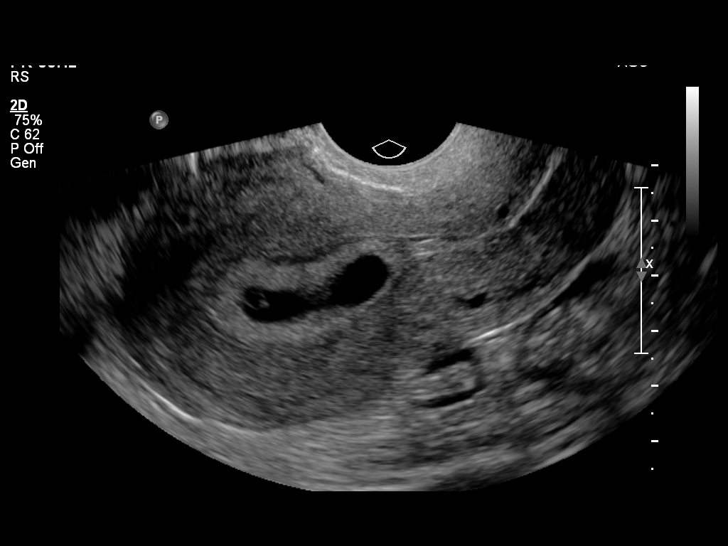
[im 17/41]
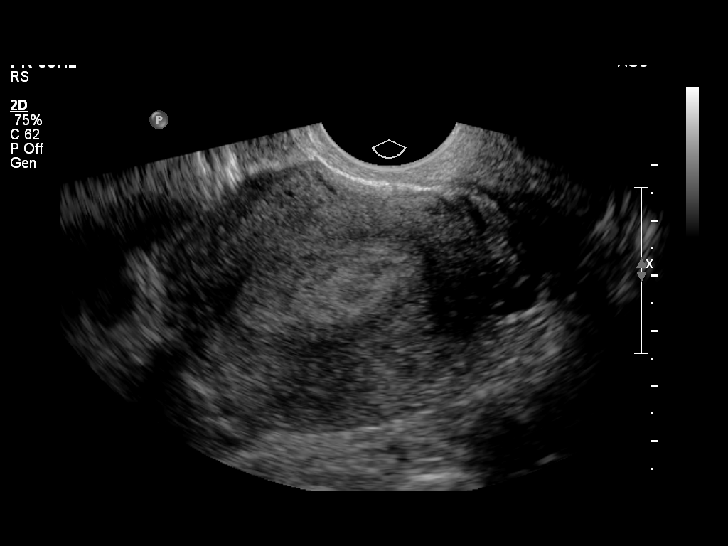
[im 21/41]
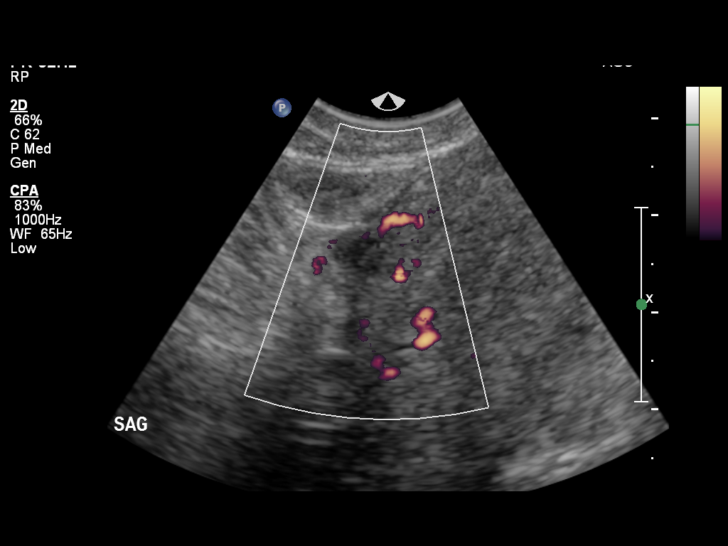
[im 24/41]
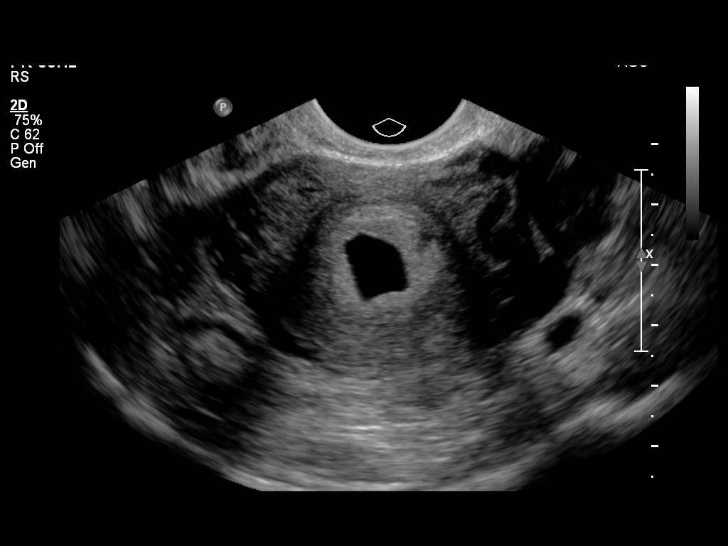
[im 27/41]
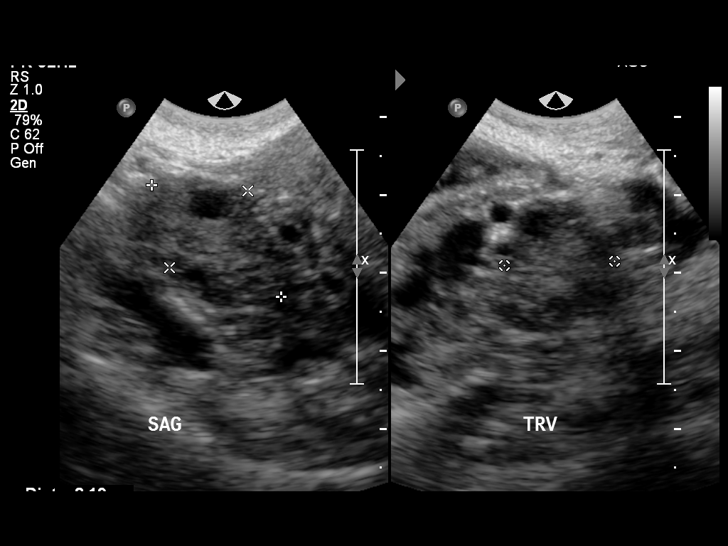
[im 30/41]
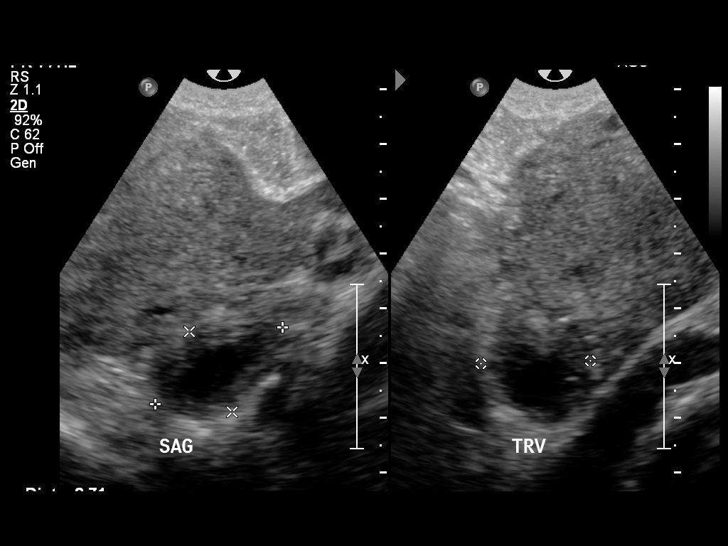
[im 33/41]
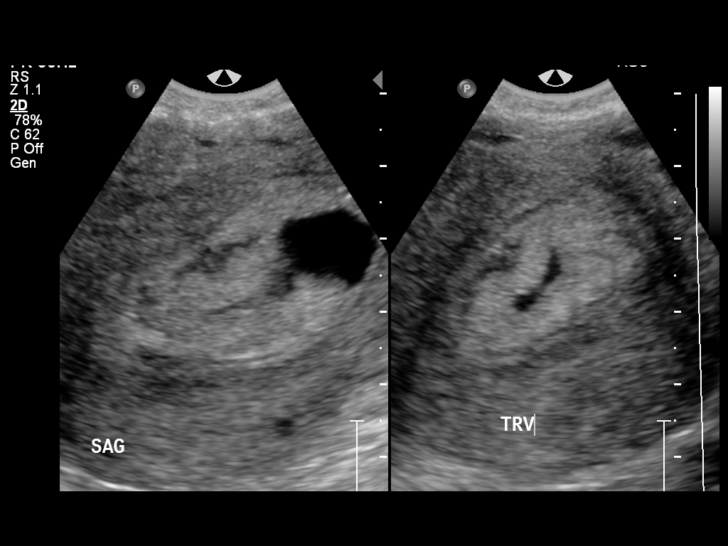
[im 36/41]
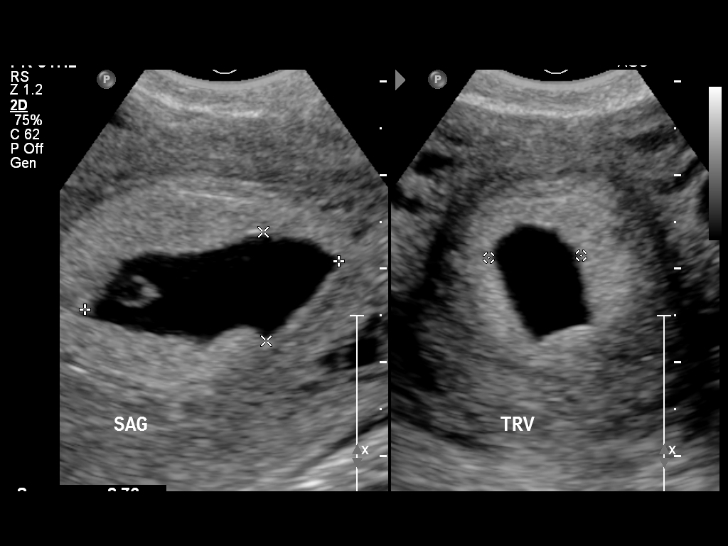
[im 39/41]
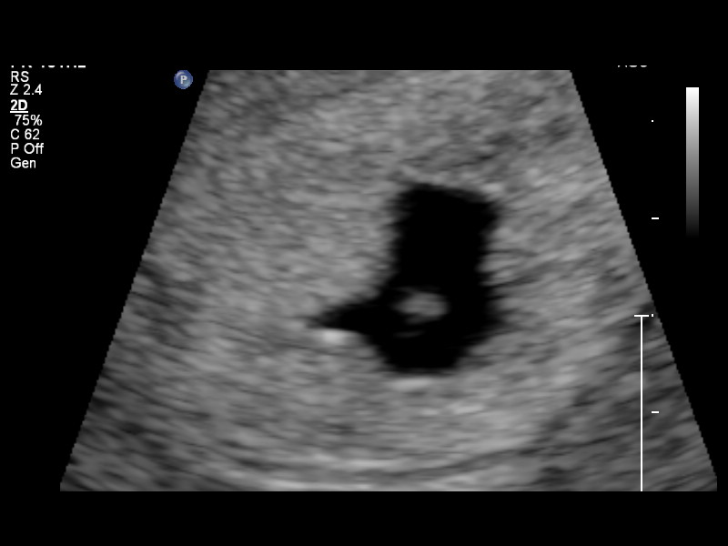

[13 of 28 positions shown; findings below may reference images not displayed]

OBSTETRICS REPORT
                      (Signed Final 12/28/2011 [DATE])

                 24_E
Procedures

 US OB COMP LESS 14 WKS                                76801.0
 US OB TRANSVAGINAL                                    76817.0
Indications

 Vaginal bleeding, unknown etiology
 Pain
Fetal Evaluation

 Preg. Location:    Intrauterine
 Gest. Sac:         Irregular
 Yolk Sac:          Visualized
 Fetal Pole:        ? early embryo
 Cardiac Activity:  Not visualized
Biometry

 GS:      16.4  mm    G. Age:   6w 4d                  EDD:   08/18/12
 CRL:      2.4  mm    G. Age:   5w 5d                  EDD:   08/24/12
Cervix Uterus Adnexa

 Cervix:       Closed.
 Uterus:       Single fibroid noted, see table below.
 Cul De Sac:   No free fluid seen.

 Left Ovary:   Within normal limits. Small corpus luteum noted.
 Right Ovary:  Within normal limits.
Myomas

 Site                     L(cm)      W(cm)      D(cm)       Location
 Anterior Right
 Blood Flow                  RI       PI       Comments

Impression

 Intrauterine irregular gestational sac with yolk sac. There may
 be an early embryo. No fetal cardiac activity is identified at
 this time, but this is within normal limits given the very small
 size of the embryo.
 0.8 cm anterior uterine fibroid.
Recommendations

 Consider follow-up ultrasound in 1 week to evaluate for
 normal progression of the fetus and cardiac activity.

 questions or concerns.

## 2014-05-24 ENCOUNTER — Encounter (HOSPITAL_COMMUNITY): Payer: Self-pay | Admitting: Obstetrics and Gynecology

## 2015-07-25 DIAGNOSIS — R110 Nausea: Secondary | ICD-10-CM | POA: Insufficient documentation

## 2015-07-25 DIAGNOSIS — Z8719 Personal history of other diseases of the digestive system: Secondary | ICD-10-CM | POA: Insufficient documentation

## 2016-03-12 ENCOUNTER — Emergency Department (HOSPITAL_COMMUNITY)
Admission: EM | Admit: 2016-03-12 | Discharge: 2016-03-12 | Disposition: A | Discharge status: Home / Self Care | Payer: BLUE CROSS/BLUE SHIELD | Attending: Emergency Medicine | Admitting: Emergency Medicine

## 2016-03-12 ENCOUNTER — Encounter (HOSPITAL_COMMUNITY): Payer: Self-pay | Admitting: Nurse Practitioner

## 2016-03-12 ENCOUNTER — Emergency Department (HOSPITAL_COMMUNITY): Payer: BLUE CROSS/BLUE SHIELD

## 2016-03-12 DIAGNOSIS — Z79899 Other long term (current) drug therapy: Secondary | ICD-10-CM | POA: Diagnosis not present

## 2016-03-12 DIAGNOSIS — M5442 Lumbago with sciatica, left side: Secondary | ICD-10-CM | POA: Diagnosis not present

## 2016-03-12 DIAGNOSIS — R42 Dizziness and giddiness: Secondary | ICD-10-CM | POA: Insufficient documentation

## 2016-03-12 DIAGNOSIS — J45909 Unspecified asthma, uncomplicated: Secondary | ICD-10-CM | POA: Diagnosis not present

## 2016-03-12 DIAGNOSIS — M549 Dorsalgia, unspecified: Secondary | ICD-10-CM

## 2016-03-12 DIAGNOSIS — F17210 Nicotine dependence, cigarettes, uncomplicated: Secondary | ICD-10-CM | POA: Diagnosis not present

## 2016-03-12 LAB — CBC WITH DIFFERENTIAL/PLATELET
BASOS ABS: 0 10*3/uL (ref 0.0–0.1)
Basophils Relative: 0 %
Eosinophils Absolute: 0 10*3/uL (ref 0.0–0.7)
Eosinophils Relative: 0 %
HEMATOCRIT: 43.2 % (ref 36.0–46.0)
HEMOGLOBIN: 14.1 g/dL (ref 12.0–15.0)
LYMPHS PCT: 36 %
Lymphs Abs: 3.5 10*3/uL (ref 0.7–4.0)
MCH: 30.1 pg (ref 26.0–34.0)
MCHC: 32.6 g/dL (ref 30.0–36.0)
MCV: 92.3 fL (ref 78.0–100.0)
MONO ABS: 0.6 10*3/uL (ref 0.1–1.0)
MONOS PCT: 7 %
NEUTROS ABS: 5.6 10*3/uL (ref 1.7–7.7)
NEUTROS PCT: 57 %
Platelets: 304 10*3/uL (ref 150–400)
RBC: 4.68 MIL/uL (ref 3.87–5.11)
RDW: 14.1 % (ref 11.5–15.5)
WBC: 9.7 10*3/uL (ref 4.0–10.5)

## 2016-03-12 LAB — URINALYSIS, ROUTINE W REFLEX MICROSCOPIC
BILIRUBIN URINE: NEGATIVE
Glucose, UA: NEGATIVE mg/dL
Hgb urine dipstick: NEGATIVE
Ketones, ur: NEGATIVE mg/dL
Leukocytes, UA: NEGATIVE
NITRITE: NEGATIVE
PH: 5 (ref 5.0–8.0)
Protein, ur: NEGATIVE mg/dL
SPECIFIC GRAVITY, URINE: 1.01 (ref 1.005–1.030)

## 2016-03-12 LAB — BASIC METABOLIC PANEL
ANION GAP: 5 (ref 5–15)
BUN: 7 mg/dL (ref 6–20)
CO2: 27 mmol/L (ref 22–32)
Calcium: 8.8 mg/dL — ABNORMAL LOW (ref 8.9–10.3)
Chloride: 106 mmol/L (ref 101–111)
Creatinine, Ser: 0.92 mg/dL (ref 0.44–1.00)
GLUCOSE: 94 mg/dL (ref 65–99)
POTASSIUM: 3.7 mmol/L (ref 3.5–5.1)
Sodium: 138 mmol/L (ref 135–145)

## 2016-03-12 LAB — POC URINE PREG, ED: Preg Test, Ur: NEGATIVE

## 2016-03-12 MED ORDER — KETOROLAC TROMETHAMINE 30 MG/ML IJ SOLN
30.0000 mg | Freq: Once | INTRAMUSCULAR | Status: AC
  Administered 2016-03-12: 30 mg via INTRAVENOUS
  Filled 2016-03-12: qty 1

## 2016-03-12 MED ORDER — MECLIZINE HCL 25 MG PO TABS
25.0000 mg | ORAL_TABLET | Freq: Once | ORAL | Status: AC
  Administered 2016-03-12: 25 mg via ORAL
  Filled 2016-03-12: qty 1

## 2016-03-12 MED ORDER — LORAZEPAM 1 MG PO TABS
1.0000 mg | ORAL_TABLET | Freq: Once | ORAL | Status: AC | PRN
  Administered 2016-03-12: 1 mg via ORAL
  Filled 2016-03-12: qty 1

## 2016-03-12 MED ORDER — OXYCODONE-ACETAMINOPHEN 5-325 MG PO TABS: 2.0000 | ORAL_TABLET | ORAL | 0 refills | Status: DC | PRN

## 2016-03-12 MED ORDER — OXYCODONE-ACETAMINOPHEN 5-325 MG PO TABS
1.0000 | ORAL_TABLET | Freq: Once | ORAL | Status: AC
  Administered 2016-03-12: 1 via ORAL
  Filled 2016-03-12: qty 1

## 2016-03-12 MED ORDER — SODIUM CHLORIDE 0.9 % IV BOLUS (SEPSIS)
1000.0000 mL | Freq: Once | INTRAVENOUS | Status: AC
  Administered 2016-03-12: 1000 mL via INTRAVENOUS

## 2016-03-12 MED ORDER — MECLIZINE HCL 32 MG PO TABS: 32.0000 mg | ORAL_TABLET | Freq: Three times a day (TID) | ORAL | 0 refills | Status: DC | PRN

## 2016-03-12 NOTE — ED Notes (Signed)
Patient transported to MRI 

## 2016-03-12 NOTE — ED Notes (Signed)
Pt back from MRI at this time

## 2016-03-12 NOTE — Discharge Instructions (Signed)
We believe your symptoms were caused by benign vertigo.  Please read through the included information and take any prescribed medication(s).  Follow up with your doctor as listed above.  If you develop any new or worsening symptoms that concern you, including but not limited to persistent dizziness/vertigo, numbness or weakness in your arms or legs, altered mental status, persistent vomiting, or fever greater than 101, please return immediately to the Emergency Department.  

## 2016-03-12 NOTE — ED Triage Notes (Signed)
Pt c/o lower back pain and dizziness today. She reports a history of chronic back pain and sciatica, but since she woke this morning the pain is worse and she feels dizzy like she may pass out and reports nausea. She denies any LOC, bowel, bladder changes, fevers. She is alert and oriented, breathing easily.

## 2016-03-12 NOTE — ED Provider Notes (Signed)
Emergency Department Provider Note   I have reviewed the triage vital signs and the nursing notes.   HISTORY  Chief Complaint Back Pain and Dizziness   HPI Monica Ballard is a 40 y.o. female with PMH of anxiety, asthma, HA, HLD presents to the ED or evaluation of lower back pain and vertigo. The patient reports 2 months of lower back pain. She has been seen at a local urgent care and a chiropractor. She has been referred to an orthopedic surgeon for further evaluation. Patient with no spine imaging to date. This morning she had worsening lower back pain and associated vertigo. The vertigo symptoms are made worse with standing, laying back suddenly, and looking to the left. She's had no associated nausea or vomiting. No difficulty walking except for pain. She reports some new groin numbness and worsening numbness in the left leg. Denies any fecal or bladder incontinence. No urinary retention symptoms. No associated fever or history of IV drug use. No trauma to the back.   Past Medical History:  Diagnosis Date  . Anemia   . Anxiety   . Asthma    smoker-uses nebulizer-not used in last year, albuterol rescue inhaler used in 11/13 preoperatively  . Chronic bronchitis (HCC)    nebulizer and rescue inhaler  . Depression   . GERD (gastroesophageal reflux disease)    occasional -tums  . Headache(784.0)   . Hearing loss in left ear   . Hypercholesteremia   . Hyperlipidemia   . Missed ab 01/09/2012   10weeks-used cytotec  . PONV (postoperative nausea and vomiting)   . Preterm labor   . Teeth decayed    lower , nothing loose-encouraged pt to seek dental care if necessary    Patient Active Problem List   Diagnosis Date Noted  . HEADACHE 01/25/2010  . CHEST PAIN 01/25/2010    Past Surgical History:  Procedure Laterality Date  . CHOLECYSTECTOMY  2001  . DILATION AND CURETTAGE OF UTERUS  06/11/2012   Procedure: DILATATION AND CURETTAGE;  Surgeon: Geryl RankinsEvelyn Varnado, MD;  Location: [facility];  Service: Gynecology;  Laterality: N/A;  sec procedure start  1241  . DILATION AND EVACUATION  01/16/2012   Procedure: DILATATION AND EVACUATION;  Surgeon: Geryl RankinsEvelyn Varnado, MD;  Location: [facility];  Service: Gynecology;  Laterality: N/A;  . LAPAROSCOPY  06/11/2012   Procedure: LAPAROSCOPY OPERATIVE;  Surgeon: Geryl RankinsEvelyn Varnado, MD;  Location: [facility];  Service: Gynecology;  Laterality: N/A;  . left ear drum rebuilt     x2  . VAGINAL HYSTERECTOMY N/A 09/01/2012   Procedure: HYSTERECTOMY VAGINAL;  Surgeon: Geryl RankinsEvelyn Varnado, MD;  Location: [facility];  Service: Gynecology;  Laterality: N/A;    Current Outpatient Rx  . Order #: [id number] Class: Historical Med  . Order #: [id number] Class: Historical Med  . Order #: [id number] Class: Historical Med  . Order #: [id number] Class: Normal  . Order #: [id number] Class: Normal  . Order #: [id number] Class: Print  . Order #: [id number] Class: Historical Med  . Order #: [id number] Class: Print    Allergies Penicillins  Family History  Problem Relation Age of Onset  . Cancer      family hx  . Diabetes      family hx  . Depression      family hx   . Coronary artery disease Mother     also MVD  . Hyperlipidemia Mother   . Hypertension Mother   . Hypertension Father   . Anesthesia problems Neg Hx     Social  History Social History  Substance Use Topics  . Smoking status: Current Every Day Smoker    Packs/day: 0.25    Years: 8.00    Types: Cigarettes  . Smokeless tobacco: Not on file     Comment: up to 1/2 ppl x 8 years  . Alcohol use No    Review of Systems  Constitutional: No fever/chills Eyes: No visual changes. ENT: No sore throat. Cardiovascular: Denies chest pain. Respiratory: Denies shortness of breath. Gastrointestinal: No abdominal pain.  No nausea, no vomiting.  No diarrhea.  No constipation. Genitourinary: Negative for dysuria. Musculoskeletal: Positive for back pain. Skin: Negative for rash. Neurological: Negative for headaches, focal  weakness or numbness. Positive vertigo.   10-point ROS otherwise negative.  ____________________________________________   PHYSICAL EXAM:  VITAL SIGNS: ED Triage Vitals  Enc Vitals Group     BP 03/12/16 1328 119/90     Pulse Rate 03/12/16 1328 68     Resp 03/12/16 1328 14     Temp 03/12/16 1328 97.8 F (36.6 C)     Temp Source 03/12/16 1328 Oral     SpO2 03/12/16 1328 97 %     Pain Score 03/12/16 1354 7   Constitutional: Alert and oriented. Well appearing and in no acute distress. Eyes: Conjunctivae are normal. PERRL. EOMI. Head: Atraumatic. Nose: No congestion/rhinnorhea. Mouth/Throat: Mucous membranes are moist.  Oropharynx non-erythematous. Neck: No stridor.  No meningeal signs. No cervical spine tenderness to palpation. Cardiovascular: Normal rate, regular rhythm. Good peripheral circulation. Grossly normal heart sounds.   Respiratory: Normal respiratory effort.  No retractions. Lungs CTAB. Gastrointestinal: Soft and nontender. No distention.  Musculoskeletal: No lower extremity tenderness nor edema. No gross deformities of extremities. Neurologic:  Normal speech and language. LLE weakness to light touch. 4+/5 strength in quads and gastroc. 2+ patellar reflexes. Positive Dix-hallpike with left head turn. Normal gait.  Skin:  Skin is warm, dry and intact. No rash noted. Psychiatric: Mood and affect are normal. Speech and behavior are normal.  ____________________________________________   LABS (all labs ordered are listed, but only abnormal results are displayed)  Labs Reviewed  BASIC METABOLIC PANEL - Abnormal; Notable for the following:       Result Value   Calcium 8.8 (*)    All other components within normal limits  CBC WITH DIFFERENTIAL/PLATELET  URINALYSIS, ROUTINE W REFLEX MICROSCOPIC (NOT AT [facility])  POC URINE PREG, ED   ____________________________________________  EKG  Reviewed in MUSE. No STEMI.   ____________________________________________  RADIOLOGY  Mr Lumbar Spine Wo Contrast  Result Date: 03/12/2016 CLINICAL DATA:  Low back pain and dizziness beginning this morning. EXAM: MRI LUMBAR SPINE WITHOUT CONTRAST TECHNIQUE: Multiplanar, multisequence MR imaging of the lumbar spine was performed. No intravenous contrast was administered. COMPARISON:  None. FINDINGS: SEGMENTATION: For the purposes of this report, the last well-formed intervertebral disc will be described as L5-S1. ALIGNMENT: No malalignment.  Maintenance of the lumbar lordosis. VERTEBRAE:Lumbar vertebral bodies are intact. Mild L4-5 and L5-S1 disc height loss with slight desiccation of the L2-3 through L5-S1 discs. Sub cm focal fat versus hemangioma L2. No abnormal bone marrow signal to suggest acute osseous process. CONUS MEDULLARIS: Conus medullaris terminates at L1-2 and demonstrates normal morphology and signal characteristics. Cauda equina is normal. PARASPINAL AND SOFT TISSUES: Included prevertebral and paraspinal soft tissues are normal. DISC LEVELS: L1-2 thru L3-4: No significant disc bulge. Mild L2-3 and L3-4 facet arthropathy without canal stenosis or neural foraminal narrowing. L4-5: LEFT subarticular to extra foraminal annular fissure without  significant disc bulge. Mild facet arthropathy. No canal stenosis or neural foraminal narrowing. L5-S1: Small 3-4 mm central disc protrusion. Small RIGHT subarticular disc protrusion. Encroachment upon the traversing S1 nerves within the lateral recess. Moderate facet arthropathy. No canal stenosis. Mild RIGHT neural foraminal narrowing. IMPRESSION: Small L5-S1 disc protrusions could affect the traversing RIGHT S1 nerve. Mild RIGHT L5-S1 neural foraminal narrowing. L4-5 LEFT annular fissure. No acute fracture or malalignment. Electronically Signed   By: Awilda Metro M.D.   On: 03/12/2016 18:10    ____________________________________________   PROCEDURES  Procedure(s)  performed:   Procedures  None ____________________________________________   INITIAL IMPRESSION / ASSESSMENT AND PLAN / ED COURSE  Pertinent labs & imaging results that were available during my care of the patient were reviewed by me and considered in my medical decision making (see chart for details).  Patient resents to the emergency room in for evaluation of acute on chronic lower back pain with worsening lower extremity weakness and some new onset groin numbness. Patient also with associated vertigo symptoms. Patient's vertigo symptoms seem peripheral in nature. I am able to elicit worsening symptoms with turning head to the left or laying back flat. Patient with no cerebellar findings on neurological exam. She has acute on chronic lower back pain with new onset groin numbness and worsening weakness in the left leg. Plan for post void residual and likely MRI of the lumbar spine to evaluate for cauda equina. Will give Antivert and Toradol. Will follow up after MRI.   05:53 PM Patient with no post void residual urine collection. Patient in MRI now. Will reassess afterwards.   07:06 PM Patient feeling much better. MRI resulted with no acute findings. We'll discharge home with small amount of oxycodone. Discussed not using hydrocodone. Also discussed spacing her muscle relaxer and oxycodone to avoid side effects. Will give follow-up information for orthopedics but advised seeing her primary care physician first. Also discharging home with meclizine for vertigo. We'll give information on the Epley maneuver which the patient can try at home.   At this time, I do not feel there is any life-threatening condition present. I have reviewed and discussed all results (EKG, imaging, lab, urine as appropriate), exam findings with patient. I have reviewed nursing notes and appropriate previous records.  I feel the patient is safe to be discharged home without further emergent workup. Discussed usual and  customary return precautions. Patient and family (if present) verbalize understanding and are comfortable with this plan.  Patient will follow-up with their primary care provider. If they do not have a primary care provider, information for follow-up has been provided to them. All questions have been answered.  ____________________________________________  FINAL CLINICAL IMPRESSION(S) / ED DIAGNOSES  Final diagnoses:  Back pain  Midline low back pain with left-sided sciatica  Vertigo     MEDICATIONS GIVEN DURING THIS VISIT:  Medications  sodium chloride 0.9 % bolus 1,000 mL (0 mLs Intravenous Stopped 03/12/16 1916)  ketorolac (TORADOL) 30 MG/ML injection 30 mg (30 mg Intravenous Given 03/12/16 1655)  meclizine (ANTIVERT) tablet 25 mg (25 mg Oral Given 03/12/16 1654)  LORazepam (ATIVAN) tablet 1 mg (1 mg Oral Given 03/12/16 1713)  oxyCODONE-acetaminophen (PERCOCET/ROXICET) 5-325 MG per tablet 1 tablet (1 tablet Oral Given 03/12/16 1830)     NEW OUTPATIENT MEDICATIONS STARTED DURING THIS VISIT:  Meclizine 12.5 mg TID PRN vertigo.    Note:  This document was prepared using Dragon voice recognition software and may include unintentional dictation errors.  Ivin Booty  Long, MD Emergency Medicine   Monica PlanJoshua G Long, MD 03/12/16 2215

## 2016-03-12 NOTE — ED Notes (Signed)
Family at bedside. 

## 2016-03-12 NOTE — ED Notes (Signed)
Lisette AbuKaylee, daughter, contact info [phone]

## 2016-03-12 NOTE — ED Notes (Signed)
Pt d/c home via w/c.

## 2016-03-12 NOTE — ED Notes (Signed)
MD at bedside. 

## 2016-04-09 DIAGNOSIS — H9012 Conductive hearing loss, unilateral, left ear, with unrestricted hearing on the contralateral side: Secondary | ICD-10-CM | POA: Insufficient documentation

## 2016-04-09 DIAGNOSIS — M26629 Arthralgia of temporomandibular joint, unspecified side: Secondary | ICD-10-CM | POA: Insufficient documentation

## 2016-04-09 DIAGNOSIS — H7202 Central perforation of tympanic membrane, left ear: Secondary | ICD-10-CM | POA: Insufficient documentation

## 2016-04-12 ENCOUNTER — Ambulatory Visit: Payer: Self-pay | Admitting: Otolaryngology

## 2016-04-16 ENCOUNTER — Ambulatory Visit: Payer: Self-pay | Admitting: Otolaryngology

## 2016-04-16 NOTE — H&P (Signed)
Monica Ballard is a 40 y.o. female who presents as a consult  Patient.   Referring Provider: Maximiano CossHungarland, John David,*   Chief complaint: Ear problem.  HPI: Pain and drainage from the left ear since July after traveling to the beach.  History of tympanoplasty surgery on the left twice years ago.  She uses Q-tips to clean her ears.  She has a history of back and neck problems.  She has been having dizziness and vertigo.  She has been taking Valium for months now without much relief.  Has a history of TMJ pain and dysfunction, and has not really had much relief.  PMH/Meds/All/SocHx/FamHx/ROS:    Past Medical History   No past medical history on file.     Past Surgical History        Past Surgical History:  Procedure Laterality Date  . CHOLECYSTECTOMY    . HYSTERECTOMY    . INNER EAR SURGERY        No family history of bleeding disorders, wound healing problems or difficulty with anesthesia.    Social History   Social History        Social History  . Marital status: Married    Spouse name: N/A  . Number of children: N/A  . Years of education: N/A   Occupational History  . Not on file.       Social History Main Topics  . Smoking status: Never Smoker  . Smokeless tobacco: Not on file  . Alcohol use Not on file  . Drug use: Not on file  . Sexual activity: Not on file       Other Topics Concern  . Not on file   Social History Narrative  . No narrative on file       Current Outpatient Prescriptions:  .  citalopram (CELEXA) 20 MG tablet, , Disp: , Rfl: 0 .  clonazePAM (KLONOPIN) 1 MG tablet, , Disp: , Rfl: 0 .  dicyclomine (BENTYL) 10 MG capsule, , Disp: , Rfl: 2 .  metaxalone (SKELAXIN) 800 MG tablet, , Disp: , Rfl: 0 .  pantoprazole (PROTONIX) 40 MG tablet, , Disp: , Rfl: 2 .  polyethylene glycol (GLYCOLAX, MIRALAX) 17 gram/dose powder, , Disp: , Rfl: 5 .  promethazine (PHENERGAN) 12.5 MG tablet, , Disp: , Rfl: 10  A complete ROS  was performed with pertinent positives/negatives noted in the HPI. The remainder of the ROS are negative.   Physical Exam:    Ht 1.753 m (5\' 9" )  Wt 86.2 kg (190 lb)  BMI 28.06 kg/m2   General:  Healthy and alert, in no distress, breathing easily. Normal affect. In a pleasant mood. Head: Normocephalic, atraumatic. No masses, or scars. Eyes: Pupils are equal, and reactive to light. Vision is grossly intact. No spontaneous or gaze nystagmus. Ears: Right ear canal is clear.  Left side with cerumen impaction that was cleaned out under the microscope.  The right tympanic membrane is intact and healthy.  The left side has a clean dry central perforation in the posterior/inferior quadrant.  Hearing: Grossly normal. Nose: Nasal cavities are clear with healthy mucosa, no polyps or exudate.Airways are patent. Face: No masses or scars, facial nerve function is symmetric. Oral Cavity: No mucosal abnormalities are noted. Tongue with normal mobility. Dentition appears healthy. Oropharynx: Tonsils are symmetric. There are no mucosal masses identified. Tongue base appears normal and healthy. Larynx/Hypopharynx: deferred Chest: Deferred Neck: No palpable masses, no cervical adenopathy, no thyroid nodules or enlargement. Neuro: Cranial nerves II-XII  will normal function. Balance: Normal gate. Other findings: none.   Independent Review of Additional Tests or Records:  none  Procedures:  Procedure note:  Indications: Cerumen impaction  Details of cerumen removal were discussed with the patient and all questions were answered.  Procedure:  Using the operating microscope, the left side was cleaned of cerumen using curettes. There was no signs of infection.   She tolerated this procedure well. There were no complications.    Impression & Plans:  Normal hearing and tympanometry on the right.  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   Loss in the repeated infections, recommend tympanoplasty surgery.  We discussed a 80-90% chance of success.  All questions were answered.  We will schedule at her convenience.  Susy Frizzle, MD

## 2016-04-17 ENCOUNTER — Encounter (HOSPITAL_BASED_OUTPATIENT_CLINIC_OR_DEPARTMENT_OTHER): Payer: Self-pay | Admitting: *Deleted

## 2016-04-17 ENCOUNTER — Ambulatory Visit (INDEPENDENT_AMBULATORY_CARE_PROVIDER_SITE_OTHER): Payer: BLUE CROSS/BLUE SHIELD | Admitting: Neurology

## 2016-04-17 ENCOUNTER — Encounter: Payer: Self-pay | Admitting: Neurology

## 2016-04-17 VITALS — BP 110/70 | HR 79 | Ht 69.0 in | Wt 188.0 lb

## 2016-04-17 DIAGNOSIS — R202 Paresthesia of skin: Secondary | ICD-10-CM | POA: Diagnosis not present

## 2016-04-17 DIAGNOSIS — R292 Abnormal reflex: Secondary | ICD-10-CM

## 2016-04-17 MED ORDER — GABAPENTIN 300 MG PO CAPS: ORAL_CAPSULE | ORAL | 5 refills | Status: DC

## 2016-04-17 NOTE — Patient Instructions (Addendum)
1.  MRI cervical spine without contrast 2.  MRI thoracic spine without contrast 3.  Start gabapentin 300mg  at bedtime x 3 days and increase to 300mg  twice daily

## 2016-04-17 NOTE — Progress Notes (Signed)
[facility] Clinic Note - Initial Visit   Date: 04/17/16  Monica Ballard MRN: [id number] DOB: May 25, 1976   Dear Dr. Norval Gable:  Thank you for your kind referral of Monica Ballard for consultation of left sided pain. Although her history is well known to you, please allow [place] to reiterate it for the purpose of our medical record. The patient was accompanied to the clinic by daughter who also provides collateral information.     History of Present Illness: Monica Ballard is a 40 y.o. right-handed Caucasian female with anxiety, asthma, hyperlipidemia, and GERD presenting for evaluation of low back pain.    Starting in June 2017, she began experiencing low back pain.  She also complains of tingling and numbness of the left base of the head, entire left arm, and left entire leg.  Tingling is intermittent and worse with prolonged standing.  It is improved when she elevates her legs.  Sometimes, she has leg weakness and buckling.  She has not had any falls.  She is mostly bothered by her left leg pain.  She denies bowel incontinence.  She does complain of stress incontinence, triggered by coughing or laughing.    She has previously seen a Land.  In August 2017, she went to the ER where MRI lumbar spine showed small L5-S1 disc protrusion, possibly affecting the RIGHT S1 nerve.  The left side appears normal.    Out-side paper records, electronic medical record, and images have been reviewed where available and summarized as:  MRI lumbar spine wo contrast 03/12/2016:   Small L5-S1 disc protrusions could affect the traversing RIGHT S1 nerve. Mild RIGHT L5-S1 neural foraminal narrowing. L4-5 LEFT annular fissure. No acute fracture or malalignment.    Past Medical History:  Diagnosis Date  . Anemia   . Anxiety   . Asthma    smoker-uses nebulizer-not used in last year, albuterol rescue inhaler used in 11/13 preoperatively  . Chronic bronchitis (HCC)    nebulizer  and rescue inhaler  . Depression   . GERD (gastroesophageal reflux disease)    occasional -tums  . Headache(784.0)   . Hearing loss in left ear   . Hypercholesteremia   . Hyperlipidemia   . Missed ab 01/09/2012   10weeks-used cytotec  . PONV (postoperative nausea and vomiting)   . Preterm labor   . Teeth decayed    lower , nothing loose-encouraged pt to seek dental care if necessary    Past Surgical History:  Procedure Laterality Date  . CHOLECYSTECTOMY, LAPAROSCOPIC  12/06/2010  . DILATION AND CURETTAGE OF UTERUS  06/11/2012   Procedure: DILATATION AND CURETTAGE;  Surgeon: Geryl Rankins, MD;  Location: [facility];  Service: Gynecology;  Laterality: N/A;  sec procedure start  1241  . DILATION AND EVACUATION  01/16/2012   Procedure: DILATATION AND EVACUATION;  Surgeon: Geryl Rankins, MD;  Location: [facility];  Service: Gynecology;  Laterality: N/A;  . LAPAROSCOPY  06/11/2012   Procedure: LAPAROSCOPY OPERATIVE;  Surgeon: Geryl Rankins, MD;  Location: [facility];  Service: Gynecology;  Laterality: N/A;  . left ear drum rebuilt     x2  . VAGINAL HYSTERECTOMY N/A 09/01/2012   Procedure: HYSTERECTOMY VAGINAL;  Surgeon: Geryl Rankins, MD;  Location: [facility];  Service: Gynecology;  Laterality: N/A;     Medications:  Outpatient Encounter Prescriptions as of 04/17/2016  Medication Sig Note  . albuterol (PROVENTIL HFA;VENTOLIN HFA) 108 (90 BASE) MCG/ACT inhaler Inhale 2 puffs into the lungs every 6 (six) hours  as needed. For shortness of breath.   . busPIRone (BUSPAR) 15 MG tablet  04/17/2016: Received from: External Pharmacy  . citalopram (CELEXA) 20 MG tablet Take 20 mg by mouth daily.   . clonazePAM (KLONOPIN) 1 MG tablet Take 1 mg by mouth 2 (two) times daily. Can take up to three times daily, but usually only takes twice a day   . dicyclomine (BENTYL) 10 MG capsule Take 10 mg by mouth 4 (four) times daily -  before meals and at bedtime.   . docusate sodium 100 MG CAPS Take 100 mg by mouth 2  (two) times daily.   Marland Kitchen ibuprofen (ADVIL,MOTRIN) 800 MG tablet Take 1 tablet (800 mg total) by mouth every 8 (eight) hours as needed for pain. For pain   . Multiple Vitamin (MULTIVITAMIN WITH MINERALS) TABS Take 1 tablet by mouth daily.   . pantoprazole (PROTONIX) 40 MG tablet Take 40 mg by mouth daily.   . promethazine (PHENERGAN) 12.5 MG tablet Take 12.5 mg by mouth every 8 (eight) hours as needed for nausea or vomiting.   . [DISCONTINUED] meclizine (ANTIVERT) 32 MG tablet Take 1 tablet (32 mg total) by mouth 3 (three) times daily as needed.   . gabapentin (NEURONTIN) 300 MG capsule Take 1 tablet at bedtime x 3 days, then increase to 1 tablet twice daily   . [DISCONTINUED] oxyCODONE-acetaminophen (PERCOCET/ROXICET) 5-325 MG tablet Take 2 tablets by mouth every 4 (four) hours as needed for severe pain.    No facility-administered encounter medications on file as of 04/17/2016.      Allergies:  Allergies  Allergen Reactions  . Penicillins Hives    Family History: Family History  Problem Relation Age of Onset  . Cancer      family hx  . Diabetes      family hx  . Depression      family hx   . Coronary artery disease Mother     also MVD  . Hyperlipidemia Mother   . Hypertension Mother   . Hypertension Father   . Anesthesia problems Neg Hx     Social History: Social History  Substance Use Topics  . Smoking status: Former Smoker    Packs/day: 0.25    Years: 8.00    Types: Cigarettes  . Smokeless tobacco: Never Used     Comment: up to 1/2 ppl x 8 years  . Alcohol use No   Social History   Social History Narrative   Lives with daughter, son and husband in a one story home.  Has 2 children.  Does not work at this time.  Education: GED.     Review of Systems:  CONSTITUTIONAL: No fevers, chills, night sweats, or weight loss.   EYES: No visual changes or eye pain ENT: No hearing changes.  No history of nose bleeds.   RESPIRATORY: No cough, wheezing and shortness of  breath.   CARDIOVASCULAR: Negative for chest pain, and palpitations.   GI: Negative for abdominal discomfort, blood in stools or black stools.  No recent change in bowel habits.   GU:  No history of incontinence.   MUSCLOSKELETAL: +history of joint pain or swelling.  No myalgias.   SKIN: Negative for lesions, rash, and itching.   HEMATOLOGY/ONCOLOGY: Negative for prolonged bleeding, bruising easily, and swollen nodes.  No history of cancer.   ENDOCRINE: Negative for cold or heat intolerance, polydipsia or goiter.   PSYCH:  +depression or anxiety symptoms.   NEURO: As Above.   Vital Signs:  BP 110/70   Pulse 79   Ht 5\' 9"  (1.753 m)   Wt 188 lb (85.3 kg)   SpO2 98%   BMI 27.76 kg/m    General Medical Exam:   General:  Well appearing, tearful at times, frustrated.   Eyes/ENT: see cranial nerve examination.   Neck: No masses appreciated.  Full range of motion without tenderness.  No carotid bruits. Respiratory:  Clear to auscultation, good air entry bilaterally.   Cardiac:  Regular rate and rhythm, no murmur.   Extremities:  No deformities, edema, or skin discoloration.  Skin:  No rashes or lesions.  Neurological Exam: MENTAL STATUS including orientation to time, place, person, recent and remote memory, attention span and concentration, language, and fund of knowledge is normal.  Speech is not dysarthric.  CRANIAL NERVES: II:  No visual field defects.  Unremarkable fundi.   III-IV-VI: Pupils equal round and reactive to light.  Normal conjugate, extra-ocular eye movements in all directions of gaze.  No nystagmus.  No ptosis.   V:  Normal facial sensation.    VII:  Normal facial symmetry and movements.  No pathologic facial reflexes.  VIII:  Normal hearing and vestibular function.   IX-X:  Normal palatal movement.   XI:  Normal shoulder shrug and head rotation.   XII:  Normal tongue strength and range of motion, no deviation or fasciculation.  MOTOR:  No atrophy, fasciculations  or abnormal movements.  No pronator drift.  Tone is normal.    Right Upper Extremity:    Left Upper Extremity:    Deltoid  5/5   Deltoid  5/5   Biceps  5/5   Biceps  5/5   Triceps  5/5   Triceps  5/5   Wrist extensors  5/5   Wrist extensors  5/5   Wrist flexors  5/5   Wrist flexors  5/5   Finger extensors  5/5   Finger extensors  5/5   Finger flexors  5/5   Finger flexors  5/5   Dorsal interossei  5/5   Dorsal interossei  5/5   Abductor pollicis  5/5   Abductor pollicis  5/5   Tone (Ashworth scale)  0  Tone (Ashworth scale)  0   Right Lower Extremity:    Left Lower Extremity:    Hip flexors  5/5   Hip flexors  5/5   Hip extensors  5/5   Hip extensors  5/5   Knee flexors  5/5   Knee flexors  5/5   Knee extensors  5/5   Knee extensors  5/5   Dorsiflexors  5/5   Dorsiflexors  5/5   Plantarflexors  5/5   Plantarflexors  5/5   Toe extensors  5/5   Toe extensors  5/5   Toe flexors  5/5   Toe flexors  5/5   Tone (Ashworth scale)  0  Tone (Ashworth scale)  0   MSRs:  Right                                                                 Left brachioradialis 3+  brachioradialis 3+  biceps 3+  biceps 3+  triceps 3+  triceps 3+  patellar 3+  patellar 3+  ankle jerk 2+  ankle jerk  2+  Hoffman no  Hoffman no  plantar response down  plantar response down   SENSORY: Patchy area of reduced pin prick over the left lower leg, otherwise normal and symmetric perception of light touch, pinprick, vibration, and proprioception.  Romberg's sign absent.   COORDINATION/GAIT: Normal finger-to- nose-finger and heel-to-shin.  Intact rapid alternating movements bilaterally.  Able to rise from a chair without using arms.  Gait is antalgic, unassisted, and stable.  IMPRESSION: 1.  Left leg > arm pain and paresthesias  - I have reviewed patient's MRI lumbar spine which does not show nerve impingement affecting the LEFT side  - MRI does show small disc protrusion possibly affecting RIGHT S1 nerve root, but  she does not have right leg symptoms, so this is asymptomatic finding  - With her hyperreflexia and left sided symptoms, imaging of the cervical and thoracic spine will be ordered.    - She does not have any bulbar symptoms or CN deficits on exam, so will hold on MRI brain for now, but this will be the next step if there is no evidence of compressive myelopathy in the cervical or thoracic region  - For pain, start gabapentin 300mg  BID  2.  Stress incontinence, follow-up with OBGYN  - the incontinence that she reports is not consistent with neurogenic incontinence   Return to clinic after above testing   The duration of this appointment visit was 45 minutes of face-to-face time with the patient.  Greater than 50% of this time was spent in counseling, explanation of diagnosis, planning of further management, and coordination of care.   Thank you for allowing me to participate in patient's care.  If I can answer any additional questions, I would be pleased to do so.    Sincerely,    Donika K. Allena KatzPatel, DO

## 2016-04-18 ENCOUNTER — Encounter (HOSPITAL_BASED_OUTPATIENT_CLINIC_OR_DEPARTMENT_OTHER): Payer: Self-pay | Admitting: *Deleted

## 2016-04-18 NOTE — Progress Notes (Signed)
Note routed

## 2016-04-23 ENCOUNTER — Ambulatory Visit (HOSPITAL_BASED_OUTPATIENT_CLINIC_OR_DEPARTMENT_OTHER)
Admission: RE | Admit: 2016-04-23 | Discharge: 2016-04-23 | Disposition: A | Discharge status: Home / Self Care | Payer: BLUE CROSS/BLUE SHIELD | Source: Ambulatory Visit | Attending: Otolaryngology | Admitting: Otolaryngology

## 2016-04-23 ENCOUNTER — Encounter (HOSPITAL_BASED_OUTPATIENT_CLINIC_OR_DEPARTMENT_OTHER)
Admission: RE | Disposition: A | Discharge status: Home / Self Care | Payer: Self-pay | Source: Ambulatory Visit | Attending: Otolaryngology

## 2016-04-23 ENCOUNTER — Encounter (HOSPITAL_BASED_OUTPATIENT_CLINIC_OR_DEPARTMENT_OTHER): Payer: Self-pay

## 2016-04-23 ENCOUNTER — Ambulatory Visit (HOSPITAL_BASED_OUTPATIENT_CLINIC_OR_DEPARTMENT_OTHER): Payer: BLUE CROSS/BLUE SHIELD | Admitting: Anesthesiology

## 2016-04-23 DIAGNOSIS — Z79899 Other long term (current) drug therapy: Secondary | ICD-10-CM | POA: Diagnosis not present

## 2016-04-23 DIAGNOSIS — H7292 Unspecified perforation of tympanic membrane, left ear: Secondary | ICD-10-CM | POA: Insufficient documentation

## 2016-04-23 DIAGNOSIS — Z87891 Personal history of nicotine dependence: Secondary | ICD-10-CM | POA: Insufficient documentation

## 2016-04-23 HISTORY — PX: TYMPANOPLASTY: SHX33

## 2016-04-23 SURGERY — TYMPANOPLASTY
Anesthesia: General | Site: Ear | Laterality: Left

## 2016-04-23 MED ORDER — DEXAMETHASONE SODIUM PHOSPHATE 4 MG/ML IJ SOLN
INTRAMUSCULAR | Status: DC | PRN
  Administered 2016-04-23: 10 mg via INTRAVENOUS

## 2016-04-23 MED ORDER — SUCCINYLCHOLINE CHLORIDE 200 MG/10ML IV SOSY
PREFILLED_SYRINGE | INTRAVENOUS | Status: DC | PRN
  Administered 2016-04-23: 100 mg via INTRAVENOUS

## 2016-04-23 MED ORDER — ALBUTEROL SULFATE HFA 108 (90 BASE) MCG/ACT IN AERS
INHALATION_SPRAY | RESPIRATORY_TRACT | Status: AC
  Filled 2016-04-23: qty 6.7

## 2016-04-23 MED ORDER — OXYCODONE HCL 5 MG/5ML PO SOLN: 5.0000 mg | Freq: Once | ORAL | Status: AC | PRN

## 2016-04-23 MED ORDER — CIPROFLOXACIN-DEXAMETHASONE 0.3-0.1 % OT SUSP
OTIC | Status: DC | PRN
  Administered 2016-04-23: 4 [drp] via OTIC

## 2016-04-23 MED ORDER — MIDAZOLAM HCL 2 MG/2ML IJ SOLN
1.0000 mg | INTRAMUSCULAR | Status: DC | PRN
  Administered 2016-04-23: 2 mg via INTRAVENOUS

## 2016-04-23 MED ORDER — METHYLENE BLUE 0.5 % INJ SOLN
INTRAVENOUS | Status: AC
  Filled 2016-04-23: qty 10

## 2016-04-23 MED ORDER — OXYCODONE HCL 5 MG PO TABS
5.0000 mg | ORAL_TABLET | Freq: Once | ORAL | Status: AC | PRN
  Administered 2016-04-23: 5 mg via ORAL

## 2016-04-23 MED ORDER — KETOROLAC TROMETHAMINE 30 MG/ML IJ SOLN: 30.0000 mg | Freq: Once | INTRAMUSCULAR | Status: DC | PRN

## 2016-04-23 MED ORDER — ONDANSETRON HCL 4 MG/2ML IJ SOLN
INTRAMUSCULAR | Status: DC | PRN
  Administered 2016-04-23: 4 mg via INTRAVENOUS

## 2016-04-23 MED ORDER — BACITRACIN ZINC 500 UNIT/GM EX OINT
TOPICAL_OINTMENT | CUTANEOUS | Status: DC | PRN
  Administered 2016-04-23: 1 via TOPICAL

## 2016-04-23 MED ORDER — HYDROCODONE-ACETAMINOPHEN 7.5-325 MG PO TABS: 1.0000 | ORAL_TABLET | Freq: Four times a day (QID) | ORAL | 0 refills | Status: DC | PRN

## 2016-04-23 MED ORDER — LACTATED RINGERS IV SOLN
INTRAVENOUS | Status: DC
  Administered 2016-04-23 (×2): via INTRAVENOUS

## 2016-04-23 MED ORDER — LIDOCAINE-EPINEPHRINE 1 %-1:100000 IJ SOLN
INTRAMUSCULAR | Status: AC
  Filled 2016-04-23: qty 1

## 2016-04-23 MED ORDER — EPINEPHRINE HCL 1 MG/ML IJ SOLN
INTRAMUSCULAR | Status: AC
  Filled 2016-04-23: qty 1

## 2016-04-23 MED ORDER — SCOPOLAMINE 1 MG/3DAYS TD PT72
1.0000 | MEDICATED_PATCH | Freq: Once | TRANSDERMAL | Status: AC | PRN
  Administered 2016-04-23: 1 via TRANSDERMAL

## 2016-04-23 MED ORDER — FENTANYL CITRATE (PF) 100 MCG/2ML IJ SOLN
INTRAMUSCULAR | Status: AC
  Filled 2016-04-23: qty 2

## 2016-04-23 MED ORDER — ONDANSETRON HCL 4 MG/2ML IJ SOLN
INTRAMUSCULAR | Status: AC
  Filled 2016-04-23: qty 2

## 2016-04-23 MED ORDER — LIDOCAINE 2% (20 MG/ML) 5 ML SYRINGE
INTRAMUSCULAR | Status: AC
  Filled 2016-04-23: qty 5

## 2016-04-23 MED ORDER — ARTIFICIAL TEARS OP OINT
TOPICAL_OINTMENT | OPHTHALMIC | Status: AC
  Filled 2016-04-23: qty 3.5

## 2016-04-23 MED ORDER — FENTANYL CITRATE (PF) 100 MCG/2ML IJ SOLN
50.0000 ug | INTRAMUSCULAR | Status: AC | PRN
  Administered 2016-04-23: 50 ug via INTRAVENOUS
  Administered 2016-04-23: 100 ug via INTRAVENOUS
  Administered 2016-04-23: 50 ug via INTRAVENOUS

## 2016-04-23 MED ORDER — CIPROFLOXACIN-DEXAMETHASONE 0.3-0.1 % OT SUSP
OTIC | Status: AC
  Filled 2016-04-23: qty 7.5

## 2016-04-23 MED ORDER — PROMETHAZINE HCL 25 MG RE SUPP: 25.0000 mg | Freq: Four times a day (QID) | RECTAL | 1 refills | Status: DC | PRN

## 2016-04-23 MED ORDER — MIDAZOLAM HCL 2 MG/2ML IJ SOLN
INTRAMUSCULAR | Status: AC
  Filled 2016-04-23: qty 2

## 2016-04-23 MED ORDER — FENTANYL CITRATE (PF) 100 MCG/2ML IJ SOLN
INTRAMUSCULAR | Status: AC
Start: 2016-04-23 — End: 2016-04-23
  Filled 2016-04-23: qty 2

## 2016-04-23 MED ORDER — FENTANYL CITRATE (PF) 100 MCG/2ML IJ SOLN
25.0000 ug | INTRAMUSCULAR | Status: DC | PRN
  Administered 2016-04-23 (×2): 50 ug via INTRAVENOUS
  Administered 2016-04-23: 25 ug via INTRAVENOUS

## 2016-04-23 MED ORDER — PROPOFOL 10 MG/ML IV BOLUS
INTRAVENOUS | Status: AC
  Filled 2016-04-23: qty 20

## 2016-04-23 MED ORDER — OXYCODONE HCL 5 MG PO TABS
ORAL_TABLET | ORAL | Status: AC
  Filled 2016-04-23: qty 1

## 2016-04-23 MED ORDER — CLINDAMYCIN HCL 300 MG PO CAPS: 300.0000 mg | ORAL_CAPSULE | Freq: Three times a day (TID) | ORAL | 0 refills | Status: DC

## 2016-04-23 MED ORDER — LIDOCAINE 2% (20 MG/ML) 5 ML SYRINGE
INTRAMUSCULAR | Status: DC | PRN
  Administered 2016-04-23: 60 mg via INTRAVENOUS

## 2016-04-23 MED ORDER — SUCCINYLCHOLINE CHLORIDE 200 MG/10ML IV SOSY
PREFILLED_SYRINGE | INTRAVENOUS | Status: AC
  Filled 2016-04-23: qty 10

## 2016-04-23 MED ORDER — DEXAMETHASONE SODIUM PHOSPHATE 10 MG/ML IJ SOLN
INTRAMUSCULAR | Status: AC
  Filled 2016-04-23: qty 1

## 2016-04-23 MED ORDER — CIPROFLOXACIN-DEXAMETHASONE 0.3-0.1 % OT SUSP: 3.0000 [drp] | Freq: Three times a day (TID) | OTIC | 2 refills | Status: DC

## 2016-04-23 MED ORDER — SODIUM CHLORIDE 0.9 % IJ SOLN
INTRAMUSCULAR | Status: AC
  Filled 2016-04-23: qty 10

## 2016-04-23 MED ORDER — LIDOCAINE-EPINEPHRINE 1 %-1:100000 IJ SOLN
INTRAMUSCULAR | Status: DC | PRN
  Administered 2016-04-23: 5 mL

## 2016-04-23 MED ORDER — PROPOFOL 10 MG/ML IV BOLUS
INTRAVENOUS | Status: DC | PRN
  Administered 2016-04-23: 50 mg via INTRAVENOUS
  Administered 2016-04-23: 200 mg via INTRAVENOUS
  Administered 2016-04-23 (×2): 50 mg via INTRAVENOUS

## 2016-04-23 MED ORDER — BACITRACIN ZINC 500 UNIT/GM EX OINT
TOPICAL_OINTMENT | CUTANEOUS | Status: AC
  Filled 2016-04-23: qty 28.35

## 2016-04-23 MED ORDER — PROMETHAZINE HCL 25 MG/ML IJ SOLN
6.2500 mg | INTRAMUSCULAR | Status: DC | PRN
  Administered 2016-04-23: 6.25 mg via INTRAVENOUS

## 2016-04-23 MED ORDER — PROMETHAZINE HCL 25 MG/ML IJ SOLN
INTRAMUSCULAR | Status: AC
  Filled 2016-04-23: qty 1

## 2016-04-23 MED ORDER — ALBUTEROL SULFATE HFA 108 (90 BASE) MCG/ACT IN AERS
INHALATION_SPRAY | RESPIRATORY_TRACT | Status: DC | PRN
  Administered 2016-04-23: 4 via RESPIRATORY_TRACT

## 2016-04-23 MED ORDER — CIPROFLOXACIN-FLUOCINOLONE PF 0.3-0.025 % OT SOLN
OTIC | Status: AC
  Filled 2016-04-23: qty 0.25

## 2016-04-23 MED ORDER — GLYCOPYRROLATE 0.2 MG/ML IJ SOLN: 0.2000 mg | Freq: Once | INTRAMUSCULAR | Status: DC | PRN

## 2016-04-23 SURGICAL SUPPLY — 66 items
ADH SKN CLS APL DERMABOND .7 (GAUZE/BANDAGES/DRESSINGS) ×1
APL SKNCLS STERI-STRIP NONHPOA (GAUZE/BANDAGES/DRESSINGS)
BALL CTTN LRG ABS STRL LF (GAUZE/BANDAGES/DRESSINGS) ×1
BANDAGE GAUZE 4  KLING STR (GAUZE/BANDAGES/DRESSINGS) IMPLANT
BENZOIN TINCTURE PRP APPL 2/3 (GAUZE/BANDAGES/DRESSINGS) IMPLANT
BLADE CLIPPER SURG (BLADE) IMPLANT
BLADE NDL 3 SS STRL (BLADE) IMPLANT
BLADE NEEDLE 3 SS STRL (BLADE) ×2 IMPLANT
BLADE NEEDLE 3MM SS STRL (BLADE) ×1
BNDG GAUZE ELAST 4 BULKY (GAUZE/BANDAGES/DRESSINGS) IMPLANT
CANISTER SUCT 1200ML W/VALVE (MISCELLANEOUS) ×3 IMPLANT
CLEANER CAUTERY TIP 5X5 PAD (MISCELLANEOUS) ×1 IMPLANT
CLOSURE WOUND 1/2 X4 (GAUZE/BANDAGES/DRESSINGS)
COTTONBALL LRG STERILE PKG (GAUZE/BANDAGES/DRESSINGS) ×3 IMPLANT
DECANTER SPIKE VIAL GLASS SM (MISCELLANEOUS) ×3 IMPLANT
DERMABOND ADVANCED (GAUZE/BANDAGES/DRESSINGS) ×2
DERMABOND ADVANCED .7 DNX12 (GAUZE/BANDAGES/DRESSINGS) IMPLANT
DRAPE EENT ADH APERT 31X51 STR (DRAPES) ×3 IMPLANT
DRAPE INCISE 23X17 IOBAN STRL (DRAPES)
DRAPE INCISE 23X17 STRL (DRAPES) IMPLANT
DRAPE INCISE IOBAN 23X17 STRL (DRAPES) IMPLANT
DRAPE MICROSCOPE URBAN (DRAPES) ×2 IMPLANT
DRAPE MICROSCOPE WILD 40.5X102 (DRAPES) IMPLANT
DROPPER MEDICINE STER 1.5ML LF (MISCELLANEOUS) IMPLANT
DRSG GLASSCOCK MASTOID ADT (GAUZE/BANDAGES/DRESSINGS) ×2 IMPLANT
DRSG GLASSCOCK MASTOID PED (GAUZE/BANDAGES/DRESSINGS) IMPLANT
DRSG TELFA 3X8 NADH (GAUZE/BANDAGES/DRESSINGS) IMPLANT
ELECT COATED BLADE 2.86 ST (ELECTRODE) ×3 IMPLANT
ELECT REM PT RETURN 9FT ADLT (ELECTROSURGICAL) ×3
ELECTRODE REM PT RTRN 9FT ADLT (ELECTROSURGICAL) ×1 IMPLANT
GAUZE SPONGE 4X4 12PLY STRL (GAUZE/BANDAGES/DRESSINGS) IMPLANT
GAUZE SPONGE 4X4 16PLY XRAY LF (GAUZE/BANDAGES/DRESSINGS) IMPLANT
GLOVE ECLIPSE 7.5 STRL STRAW (GLOVE) ×3 IMPLANT
GOWN STRL REUS W/ TWL LRG LVL3 (GOWN DISPOSABLE) ×2 IMPLANT
GOWN STRL REUS W/ TWL XL LVL3 (GOWN DISPOSABLE) ×1 IMPLANT
GOWN STRL REUS W/TWL LRG LVL3 (GOWN DISPOSABLE)
GOWN STRL REUS W/TWL XL LVL3 (GOWN DISPOSABLE) ×6
IV CATH AUTO 14GX1.75 SAFE ORG (IV SOLUTION) ×2 IMPLANT
IV SET EXT 30 76VOL 4 MALE LL (IV SETS) ×3 IMPLANT
NDL PRECISIONGLIDE 27X1.5 (NEEDLE) ×1 IMPLANT
NDL SAFETY ECLIPSE 18X1.5 (NEEDLE) ×1 IMPLANT
NEEDLE HYPO 18GX1.5 SHARP (NEEDLE) ×3
NEEDLE PRECISIONGLIDE 27X1.5 (NEEDLE) ×3 IMPLANT
NS IRRIG 1000ML POUR BTL (IV SOLUTION) ×3 IMPLANT
PACK BASIN DAY SURGERY FS (CUSTOM PROCEDURE TRAY) ×3 IMPLANT
PACK ENT DAY SURGERY (CUSTOM PROCEDURE TRAY) ×3 IMPLANT
PAD CLEANER CAUTERY TIP 5X5 (MISCELLANEOUS) ×2
PAD DRESSING TELFA 3X8 NADH (GAUZE/BANDAGES/DRESSINGS) IMPLANT
PENCIL FOOT CONTROL (ELECTRODE) ×3 IMPLANT
SHEET MEDIUM DRAPE 40X70 STRL (DRAPES) IMPLANT
SLEEVE SCD COMPRESS KNEE MED (MISCELLANEOUS) IMPLANT
SPONGE GAUZE 4X4 12PLY STER LF (GAUZE/BANDAGES/DRESSINGS) IMPLANT
SPONGE SURGIFOAM ABS GEL 12-7 (HEMOSTASIS) IMPLANT
STRIP CLOSURE SKIN 1/2X4 (GAUZE/BANDAGES/DRESSINGS) IMPLANT
SUT CHROMIC 3 0 PS 2 (SUTURE) IMPLANT
SUT CHROMIC 4 0 P 3 18 (SUTURE) IMPLANT
SUT CHROMIC 4 0 PS 2 18 (SUTURE) IMPLANT
SUT ETHILON 5 0 P 3 18 (SUTURE)
SUT NYLON ETHILON 5-0 P-3 1X18 (SUTURE) IMPLANT
SUT PLAIN 5 0 P 3 18 (SUTURE) IMPLANT
SUT VIC AB 3-0 FS2 27 (SUTURE) ×2 IMPLANT
SYR 5ML LL (SYRINGE) IMPLANT
SYR BULB 3OZ (MISCELLANEOUS) IMPLANT
TOWEL OR 17X24 6PK STRL BLUE (TOWEL DISPOSABLE) ×3 IMPLANT
TRAY DSU PREP LF (CUSTOM PROCEDURE TRAY) ×3 IMPLANT
TUBING IRRIGATION (MISCELLANEOUS) IMPLANT

## 2016-04-23 NOTE — H&P (View-Only) (Signed)
Monica Ballard is a 40 y.o. female who presents as a consult  Patient.   Referring Provider: Maximiano CossHungarland, John David,*   Chief complaint: Ear problem.  HPI: Pain and drainage from the left ear since July after traveling to the beach.  History of tympanoplasty surgery on the left twice years ago.  She uses Q-tips to clean her ears.  She has a history of back and neck problems.  She has been having dizziness and vertigo.  She has been taking Valium for months now without much relief.  Has a history of TMJ pain and dysfunction, and has not really had much relief.  PMH/Meds/All/SocHx/FamHx/ROS:    Past Medical History   No past medical history on file.     Past Surgical History        Past Surgical History:  Procedure Laterality Date  . CHOLECYSTECTOMY    . HYSTERECTOMY    . INNER EAR SURGERY        No family history of bleeding disorders, wound healing problems or difficulty with anesthesia.    Social History   Social History        Social History  . Marital status: Married    Spouse name: N/A  . Number of children: N/A  . Years of education: N/A   Occupational History  . Not on file.       Social History Main Topics  . Smoking status: Never Smoker  . Smokeless tobacco: Not on file  . Alcohol use Not on file  . Drug use: Not on file  . Sexual activity: Not on file       Other Topics Concern  . Not on file   Social History Narrative  . No narrative on file       Current Outpatient Prescriptions:  .  citalopram (CELEXA) 20 MG tablet, , Disp: , Rfl: 0 .  clonazePAM (KLONOPIN) 1 MG tablet, , Disp: , Rfl: 0 .  dicyclomine (BENTYL) 10 MG capsule, , Disp: , Rfl: 2 .  metaxalone (SKELAXIN) 800 MG tablet, , Disp: , Rfl: 0 .  pantoprazole (PROTONIX) 40 MG tablet, , Disp: , Rfl: 2 .  polyethylene glycol (GLYCOLAX, MIRALAX) 17 gram/dose powder, , Disp: , Rfl: 5 .  promethazine (PHENERGAN) 12.5 MG tablet, , Disp: , Rfl: 10  A complete ROS  was performed with pertinent positives/negatives noted in the HPI. The remainder of the ROS are negative.   Physical Exam:    Ht 1.753 m (5\' 9" )  Wt 86.2 kg (190 lb)  BMI 28.06 kg/m2   General:  Healthy and alert, in no distress, breathing easily. Normal affect. In a pleasant mood. Head: Normocephalic, atraumatic. No masses, or scars. Eyes: Pupils are equal, and reactive to light. Vision is grossly intact. No spontaneous or gaze nystagmus. Ears: Right ear canal is clear.  Left side with cerumen impaction that was cleaned out under the microscope.  The right tympanic membrane is intact and healthy.  The left side has a clean dry central perforation in the posterior/inferior quadrant.  Hearing: Grossly normal. Nose: Nasal cavities are clear with healthy mucosa, no polyps or exudate.Airways are patent. Face: No masses or scars, facial nerve function is symmetric. Oral Cavity: No mucosal abnormalities are noted. Tongue with normal mobility. Dentition appears healthy. Oropharynx: Tonsils are symmetric. There are no mucosal masses identified. Tongue base appears normal and healthy. Larynx/Hypopharynx: deferred Chest: Deferred Neck: No palpable masses, no cervical adenopathy, no thyroid nodules or enlargement. Neuro: Cranial nerves II-XII  will normal function. Balance: Normal gate. Other findings: none.   Independent Review of Additional Tests or Records:  none  Procedures:  Procedure note:  Indications: Cerumen impaction  Details of cerumen removal were discussed with the patient and all questions were answered.  Procedure:  Using the operating microscope, the left side was cleaned of cerumen using curettes. There was no signs of infection.   She tolerated this procedure well. There were no complications.    Impression & Plans:  Normal hearing and tympanometry on the right.  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   Loss in the repeated infections, recommend tympanoplasty surgery.  We discussed a 80-90% chance of success.  All questions were answered.  We will schedule at her convenience.  Susy Frizzle, MD

## 2016-04-23 NOTE — Discharge Instructions (Signed)
Avoid nose blowing, lifting anything greater than 10 pounds, bending over. If you have to sneeze, make sure your mouth is open. Keep all water out of the ear.  On Tuesday morning you may remove the dressing. Undue the Velcro strap. Remove the adhesive pad from the forehead area. The dressing will then come off of the ear easily. There is a thin gauze behind the ear this can be removed. Remove the cotton ball from the ear placed 3 drops and then replace the cotton ball. Do this 3 times daily.     Post Anesthesia Home Care Instructions  Activity: Get plenty of rest for the remainder of the day. A responsible adult should stay with you for 24 hours following the procedure.  For the next 24 hours, DO NOT: -Drive a car -Advertising copywriterperate machinery -Drink alcoholic beverages -Take any medication unless instructed by your physician -Make any legal decisions or sign important papers.  Meals: Start with liquid foods such as gelatin or soup. Progress to regular foods as tolerated. Avoid greasy, spicy, heavy foods. If nausea and/or vomiting occur, drink only clear liquids until the nausea and/or vomiting subsides. Call your physician if vomiting continues.  Special Instructions/Symptoms: Your throat may feel dry or sore from the anesthesia or the breathing tube placed in your throat during surgery. If this causes discomfort, gargle with warm salt water. The discomfort should disappear within 24 hours.  If you had a scopolamine patch placed behind your ear for the management of post- operative nausea and/or vomiting:  1. The medication in the patch is effective for 72 hours, after which it should be removed.  Wrap patch in a tissue and discard in the trash. Wash hands thoroughly with soap and water. 2. You may remove the patch earlier than 72 hours if you experience unpleasant side effects which may include dry mouth, dizziness or visual disturbances. 3. Avoid touching the patch. Wash your hands with soap and  water after contact with the patch.

## 2016-04-23 NOTE — Transfer of Care (Signed)
Immediate Anesthesia Transfer of Care Note  Patient: Monica Ballard  Procedure(s) Performed: Procedure(s): TYMPANOPLASTY LEFT EAR (Left)  Patient Location: PACU  Anesthesia Type:General  Level of Consciousness: awake, sedated, patient cooperative and confused  Airway & Oxygen Therapy: Patient Spontanous Breathing and Patient connected to face mask oxygen  Post-op Assessment: Report given to RN and Post -op Vital signs reviewed and stable  Post vital signs: Reviewed and stable  Last Vitals:  Vitals:   04/23/16 0750 04/23/16 1042  BP: 111/69 126/70  Pulse: 65 90  Resp: 18 19  Temp: 36.8 C     Last Pain:  Vitals:   04/23/16 0750  TempSrc: Oral         Complications: No apparent anesthesia complications

## 2016-04-23 NOTE — Interval H&P Note (Signed)
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 After consideration of risks, benefits and other options for treatment, the patient has consented to  Procedure(s): TYMPANOPLASTY LEFT EAR (Left) as a surgical intervention .  The patient's history has been reviewed, patient examined, no change in status, stable for surgery.  I have reviewed the patient's chart and labs.  Questions were answered to the patient's satisfaction.     Marely Apgar

## 2016-04-23 NOTE — Op Note (Signed)
OPERATIVE REPORT  DATE OF SURGERY: 04/23/2016  PATIENT:  Monica Ballard,  40 y.o. 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 Following induction of general endotracheal anesthesia, the left ear was prepped and draped in a standard fashion. Previous scar was used for the postauricular incision and was infiltrated with local anesthetic solution. 4 quadrants of the external auditory canal were also infiltrated. Radial incisions were created with a sickle knife at 8:00 and 4:00. A round knife was used to create a vascular strip which was elevated posteriorly. A tympanomeatal flap was initiated towards the drum.  The postauricular incision was accomplished using a 15 scalpel. There was a mastoid cavity present with canal wall intact. The mastoid was exposed and was completely healthy and clear. There is no remaining temporalis tissue to harvest so periosteum was used. This was pressed and dried on the back table. The Perkins retractor was then used to hold the ear forward.  The perforation was identified. The edges were removed using a Heran Campau pick and cup forceps. The malleus manubrium was intact. There appeared that there may have been an incus interposition graft in place but that area was left on molested. The middle ear was clear and healthy. There was a small piece of Silastic sheeting that was removed. The middle ear and eustachian tube region were packed with saline soaked Gelfoam pieces. The dried graft was notched for  the malleus and was then placed in an underlay technique. Additional packing was placed in the middle ear to support the graft and to keep the edges of the perforation laying nicely on top of the graft. The tympanomeatal flap was laid down back onto the canal wall and onto the graft. The ear canal was packed with Ciprodex-soaked Gelfoam. The postauricular incision was reapproximated with a running subcuticular 3-0 chromic. Dermabond was used on the surface. The external meatus was packed with bacitracin coated cotton and a Glasscock dressing was applied. The patient was awakened extubated and transferred to recovery in stable condition.    PATIENT DISPOSITION:  To PACU, stable

## 2016-04-23 NOTE — Anesthesia Procedure Notes (Signed)
Procedure Name: Intubation Date/Time: 04/23/2016 8:57 AM Performed by: Gar GibbonKEETON, Geniene List S Pre-anesthesia Checklist: Patient identified, Emergency Drugs available, Suction available and Patient being monitored Patient Re-evaluated:Patient Re-evaluated prior to inductionOxygen Delivery Method: Circle system utilized Preoxygenation: Pre-oxygenation with 100% oxygen Intubation Type: IV induction Ventilation: Mask ventilation without difficulty Laryngoscope Size: Mac and 3 Grade View: Grade II Tube type: Oral Rae Tube size: 7.0 mm Number of attempts: 1 Airway Equipment and Method: Stylet and Oral airway Placement Confirmation: ETT inserted through vocal cords under direct vision,  positive ETCO2 and breath sounds checked- equal and bilateral Secured at: 20 cm Tube secured with: Tape Dental Injury: Teeth and Oropharynx as per pre-operative assessment

## 2016-04-23 NOTE — Op Note (Signed)
Please disregard this note, this was an accidental duplicate.

## 2016-04-23 NOTE — Anesthesia Preprocedure Evaluation (Signed)
Anesthesia Evaluation  Patient identified by MRN, date of birth, ID band Patient awake    Reviewed: Allergy & Precautions, NPO status , Patient's Chart, lab work & pertinent test results  Airway Mallampati: II  TM Distance: >3 FB Neck ROM: Full    Dental  (+) Poor Dentition, Chipped   Pulmonary asthma , former smoker,    Pulmonary exam normal breath sounds clear to auscultation       Cardiovascular negative cardio ROS Normal cardiovascular exam Rhythm:Regular Rate:Normal     Neuro/Psych negative neurological ROS  negative psych ROS   GI/Hepatic negative GI ROS, Neg liver ROS,   Endo/Other  negative endocrine ROS  Renal/GU negative Renal ROS  negative genitourinary   Musculoskeletal negative musculoskeletal ROS (+)   Abdominal   Peds negative pediatric ROS (+)  Hematology negative hematology ROS (+)   Anesthesia Other Findings   Reproductive/Obstetrics negative OB ROS                             Anesthesia Physical Anesthesia Plan  ASA: II  Anesthesia Plan: General   Post-op Pain Management:    Induction: Intravenous  Airway Management Planned: LMA  Additional Equipment:   Intra-op Plan:   Post-operative Plan: Extubation in OR  Informed Consent: I have reviewed the patients History and Physical, chart, labs and discussed the procedure including the risks, benefits and alternatives for the proposed anesthesia with the patient or authorized representative who has indicated his/her understanding and acceptance.   Dental advisory given  Plan Discussed with: CRNA and Surgeon  Anesthesia Plan Comments:         Anesthesia Quick Evaluation

## 2016-04-23 NOTE — Anesthesia Postprocedure Evaluation (Signed)
Anesthesia Post Note  Patient: Monica Ballard  Procedure(s) Performed: Procedure(s) (LRB): TYMPANOPLASTY LEFT EAR (Left)  Patient location during evaluation: PACU Anesthesia Type: General Level of consciousness: awake and alert Pain management: pain level controlled Vital Signs Assessment: post-procedure vital signs reviewed and stable Respiratory status: spontaneous breathing, nonlabored ventilation, respiratory function stable and patient connected to nasal cannula oxygen Cardiovascular status: blood pressure returned to baseline and stable Postop Assessment: no signs of nausea or vomiting Anesthetic complications: no    Last Vitals:  Vitals:   04/23/16 0750 04/23/16 1042  BP: 111/69 126/70  Pulse: 65 90  Resp: 18 19  Temp: 36.8 C     Last Pain:  Vitals:   04/23/16 1042  TempSrc:   PainSc: (P) 0-No pain                 Maxwell Martorano S

## 2016-04-25 ENCOUNTER — Encounter (HOSPITAL_BASED_OUTPATIENT_CLINIC_OR_DEPARTMENT_OTHER): Payer: Self-pay | Admitting: Otolaryngology

## 2016-05-29 DIAGNOSIS — H8110 Benign paroxysmal vertigo, unspecified ear: Secondary | ICD-10-CM | POA: Insufficient documentation

## 2016-05-30 ENCOUNTER — Other Ambulatory Visit: Payer: Self-pay | Admitting: *Deleted

## 2016-05-30 ENCOUNTER — Telehealth: Payer: Self-pay | Admitting: Neurology

## 2016-05-30 MED ORDER — DIAZEPAM 5 MG PO TABS: 5.0000 mg | ORAL_TABLET | Freq: Once | ORAL | 0 refills | Status: AC

## 2016-05-30 NOTE — Telephone Encounter (Signed)
Rx faxed in.

## 2016-05-30 NOTE — Telephone Encounter (Signed)
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 She is scheduled to have an MRI on Saturday 06/09/16 and she is needing something called in for her Claustrophobia while having the MRI. She uses [facility]. Her # is [phone]. Thank you

## 2016-06-07 ENCOUNTER — Telehealth: Payer: Self-pay | Admitting: Neurology

## 2016-06-07 NOTE — Telephone Encounter (Signed)
MRI thoracic and cervical spine approved #[id number].   Valid 11/15 - 07/05/2016 at [facility].  Lashonne Shull K. Allena KatzPatel, DO

## 2016-06-09 ENCOUNTER — Ambulatory Visit
Admission: RE | Admit: 2016-06-09 | Discharge: 2016-06-09 | Disposition: A | Discharge status: Home / Self Care | Payer: BLUE CROSS/BLUE SHIELD | Source: Ambulatory Visit | Attending: Neurology | Admitting: Neurology

## 2016-06-09 DIAGNOSIS — R292 Abnormal reflex: Secondary | ICD-10-CM

## 2016-11-12 ENCOUNTER — Encounter: Payer: Self-pay | Admitting: Neurology

## 2016-11-12 ENCOUNTER — Ambulatory Visit
Admission: RE | Admit: 2016-11-12 | Discharge: 2016-11-12 | Disposition: A | Discharge status: Home / Self Care | Payer: BLUE CROSS/BLUE SHIELD | Source: Ambulatory Visit | Attending: Neurology | Admitting: Neurology

## 2016-11-12 ENCOUNTER — Ambulatory Visit (INDEPENDENT_AMBULATORY_CARE_PROVIDER_SITE_OTHER): Payer: BLUE CROSS/BLUE SHIELD | Admitting: Neurology

## 2016-11-12 DIAGNOSIS — M545 Low back pain, unspecified: Secondary | ICD-10-CM | POA: Insufficient documentation

## 2016-11-12 DIAGNOSIS — M5442 Lumbago with sciatica, left side: Secondary | ICD-10-CM

## 2016-11-12 DIAGNOSIS — G8929 Other chronic pain: Secondary | ICD-10-CM

## 2016-11-12 MED ORDER — DICLOFENAC SODIUM 1 % TD GEL: 4.0000 g | Freq: Four times a day (QID) | TRANSDERMAL | 11 refills | Status: DC | PRN

## 2016-11-12 MED ORDER — MELOXICAM 15 MG PO TABS: 15.0000 mg | ORAL_TABLET | Freq: Every day | ORAL | 11 refills | Status: DC

## 2016-11-12 NOTE — Progress Notes (Signed)
PATIENT: Monica Ballard DOB: 06/30/1976  Chief Complaint  Patient presents with  . Numbness    She is here with her daughter, Monica Ballard.  Reports left-sided pain, numbness and tingling that started about six months ago.  Symptoms are intermittent and present in her back, left arm, left leg and foot.  She has tried gabapentin , qhs but could not tolerate the side effects of drowsiness the following day.  Marland Kitchen PCP    Monica Coss, MD     HISTORICAL  Monica Ballard is a 41 years old right-handed female, seen in refer by her primary care physician Dr. Carlena Bjornstad [facility] for evaluation of left side low back pain, radiating pain to left lower extremity, initial evaluation was on November 12 2016.  She had a history of anxiety, is taking BuSpar 15 mg every day, had a history of left tympanic membrane repair surgery, mild left-sided decreased hearing.  Around April 2017,  without clear triggers, she has developed low back pain, radiating to her left hip, left thigh, posterior leg, bottom of her foot,   Her left side low back pain, radiating pain to left lower extremity gradually getting worse, she felt constant pressure, 6 out of 10, present 50% of her daytime, has tried over-the-counter Tylenol and ibuprofen heating pad, physical therapy, stretching exercise chiropractor to her manipulation without helping her symptoms.  She denies bowel and bladder incontinence.  We have personally reviewed MRI lumbar in August 2017, mild degenerative disc disease, small L5-S1 disc protrusion, no significant foraminal canal stenosis.  MRI of cervical and thoracic spine showed no significant abnormality.  Today's examination shows significant tenderness around left SI joint  REVIEW OF SYSTEMS: Full 14 system review of systems performed and notable only for headache, depression anxiety decreased energy, runny nose, hearing loss, swelling legs, fatigue chills, cramps,  ALLERGIES: Allergies    Allergen Reactions  . Hydrocodone-Acetaminophen Hives and Swelling    Hives  . Penicillins Hives    HOME MEDICATIONS: Current Outpatient Prescriptions  Medication Sig Dispense Refill  . busPIRone (BUSPAR) 15 MG tablet   0  . ciprofloxacin-dexamethasone (CIPRODEX) otic suspension Place 3 drops into the left ear 3 (three) times daily. 7.5 mL 2  . citalopram (CELEXA) 20 MG tablet Take 20 mg by mouth daily.    . clonazePAM (KLONOPIN) 1 MG tablet Take 1 mg by mouth 2 (two) times daily. Can take up to three times daily, but usually only takes twice a day    . dicyclomine (BENTYL) 10 MG capsule Take 10 mg by mouth 4 (four) times daily -  before meals and at bedtime.    Marland Kitchen ibuprofen (ADVIL,MOTRIN) 800 MG tablet Take 1 tablet (800 mg total) by mouth every 8 (eight) hours as needed for pain. For pain 30 tablet 2  . Multiple Vitamin (MULTIVITAMIN WITH MINERALS) TABS Take 1 tablet by mouth daily.    . pantoprazole (PROTONIX) 40 MG tablet Take 40 mg by mouth daily.    . polyethylene glycol (MIRALAX / GLYCOLAX) packet Take 17 g by mouth daily.    . promethazine (PHENERGAN) 12.5 MG tablet Take 12.5 mg by mouth every 8 (eight) hours as needed for nausea or vomiting.    . promethazine (PHENERGAN) 25 MG suppository Place 1 suppository (25 mg total) rectally every 6 (six) hours as needed for nausea or vomiting. 12 suppository 1   No current facility-administered medications for this visit.     PAST MEDICAL HISTORY: Past Medical History:  Diagnosis Date  . Anemia   . Anxiety   . Asthma    smoker-uses nebulizer-not used in last year, albuterol rescue inhaler used in 11/13 preoperatively  . Chronic bronchitis (HCC)    nebulizer and rescue inhaler  . Depression   . GERD (gastroesophageal reflux disease)    occasional -tums  . Headache(784.0)   . Hearing loss in left ear   . Hypercholesteremia   . Hyperlipidemia   . Missed ab 01/09/2012   10weeks-used cytotec  . Numbness and tingling   . PONV  (postoperative nausea and vomiting)   . Preterm labor   . Teeth decayed    lower , nothing loose-encouraged pt to seek dental care if necessary    PAST SURGICAL HISTORY: Past Surgical History:  Procedure Laterality Date  . CHOLECYSTECTOMY, LAPAROSCOPIC  12/06/2010  . DILATION AND CURETTAGE OF UTERUS  06/11/2012   Procedure: DILATATION AND CURETTAGE;  Surgeon: Geryl Rankins, MD;  Location: [facility];  Service: Gynecology;  Laterality: N/A;  sec procedure start  1241  . DILATION AND EVACUATION  01/16/2012   Procedure: DILATATION AND EVACUATION;  Surgeon: Geryl Rankins, MD;  Location: [facility];  Service: Gynecology;  Laterality: N/A;  . LAPAROSCOPY  06/11/2012   Procedure: LAPAROSCOPY OPERATIVE;  Surgeon: Geryl Rankins, MD;  Location: [facility];  Service: Gynecology;  Laterality: N/A;  . left ear drum rebuilt     x2  . TYMPANOPLASTY Left 04/23/2016   Procedure: TYMPANOPLASTY LEFT EAR;  Surgeon: Serena Colonel, MD;  Location: [facility];  Service: ENT;  Laterality: Left;  Marland Kitchen VAGINAL HYSTERECTOMY N/A 09/01/2012   Procedure: HYSTERECTOMY VAGINAL;  Surgeon: Geryl Rankins, MD;  Location: [facility];  Service: Gynecology;  Laterality: N/A;    FAMILY HISTORY: Family History  Problem Relation Age of Onset  . Cancer      family hx  . Diabetes      family hx  . Depression      family hx   . Coronary artery disease Mother     also MVD  . Hyperlipidemia Mother   . Hypertension Mother   . Hypertension Father   . Diabetes Father   . Diabetes Maternal Grandmother   . Colon cancer Paternal Grandmother   . Breast cancer Maternal Aunt   . Anesthesia problems Neg Hx     SOCIAL HISTORY:  Social History   Social History  . Marital status: Married    Spouse name: N/A  . Number of children: 2  . Years of education: GED   Occupational History  . Housewife    Social History Main Topics  . Smoking status: Former Smoker    Packs/day: 0.25    Years: 8.00    Types: Cigarettes  .  Smokeless tobacco: Never Used     Comment: up to 1/2 ppl x 8 years  . Alcohol use No  . Drug use: No  . Sexual activity: Yes    Birth control/ protection: Surgical   Other Topics Concern  . Not on file   Social History Narrative   Lives with daughter, son and husband in a one story home.  Has 2 children.  Does not work at this time.  Education: GED.    Right-handed.   4-5 twelve ounce cans per day.        PHYSICAL EXAM   Vitals:   11/12/16 1026  BP: 118/67  Pulse: 61  Weight: 195 lb (88.5 kg)  Height:  (1.753 m)  Not recorded      Body mass index is 28.8 kg/m.  PHYSICAL EXAMNIATION:  Gen: NAD, conversant, well nourised, obese, well groomed                     Cardiovascular: Regular rate rhythm, no peripheral edema, warm, nontender. Eyes: Conjunctivae clear without exudates or hemorrhage Neck: Supple, no carotid bruits. Pulmonary: Clear to auscultation bilaterally   NEUROLOGICAL EXAM:  MENTAL STATUS: Speech:    Speech is normal; fluent and spontaneous with normal comprehension.  Cognition:     Orientation to time, place and person     Normal recent and remote memory     Normal Attention span and concentration     Normal Language, naming, repeating,spontaneous speech     Fund of knowledge   CRANIAL NERVES: CN II: Visual fields are full to confrontation. Fundoscopic exam is normal with sharp discs and no vascular changes. Pupils are round equal and briskly reactive to light. CN III, IV, VI: extraocular movement are normal. No ptosis. CN V: Facial sensation is intact to pinprick in all 3 divisions bilaterally. Corneal responses are intact.  CN VII: Face is symmetric with normal eye closure and smile. CN VIII: Hearing is normal to rubbing fingers CN IX, X: Palate elevates symmetrically. Phonation is normal. CN XI: Head turning and shoulder shrug are intact CN XII: Tongue is midline with normal movements and no atrophy.  MOTOR: There is no pronator  drift of out-stretched arms. Muscle bulk and tone are normal. Muscle strength is normal.  REFLEXES: Reflexes are 2+ and symmetric at the biceps, triceps, knees, and ankles. Plantar responses are flexor.  SENSORY: Intact to light touch, pinprick, positional sensation and vibratory sensation are intact in fingers and toes.  COORDINATION: Rapid alternating movements and fine finger movements are intact. There is no dysmetria on finger-to-nose and heel-knee-shin.    GAIT/STANCE: Mildly antalgic, able to bear weight with tiptoe and heels Romberg is absent.   DIAGNOSTIC DATA (LABS, IMAGING, TESTING) - I reviewed patient records, labs, notes, testing and imaging myself where available.   ASSESSMENT AND PLAN  MAYMUNAH STEGEMANN is a 41 y.o. female   Left-sided low back pain, radiating pain to left lower extremity,  EMG nerve conduction study to rule out left lumbar radiculopathy  Tenderness along left SI joint most suggestive as I joint pathology versus neuromuscular etiology  X-ray of left hip, pelvic region  NSAIDs, Mobic as needed back stretching exercises, heating pad,  May consider orthopedic, pain management if she remains symptomatic,  Levert Feinstein, M.D. Ph.D.  [facility] [address], [address] [place], [place] [address] Ph: [phone] Fax: [phone]  CC: Monica Coss, MD

## 2016-11-12 NOTE — Patient Instructions (Signed)
[facility] 0.8 mi  [address] [phone] Open now

## 2016-11-13 ENCOUNTER — Encounter: Payer: Self-pay | Admitting: *Deleted

## 2016-11-13 DIAGNOSIS — M1990 Unspecified osteoarthritis, unspecified site: Secondary | ICD-10-CM | POA: Insufficient documentation

## 2016-12-12 ENCOUNTER — Encounter: Payer: BLUE CROSS/BLUE SHIELD | Admitting: Neurology

## 2017-02-08 ENCOUNTER — Ambulatory Visit (INDEPENDENT_AMBULATORY_CARE_PROVIDER_SITE_OTHER): Payer: BLUE CROSS/BLUE SHIELD | Admitting: Neurology

## 2017-02-08 ENCOUNTER — Encounter (INDEPENDENT_AMBULATORY_CARE_PROVIDER_SITE_OTHER): Payer: Self-pay | Admitting: Neurology

## 2017-02-08 DIAGNOSIS — M5442 Lumbago with sciatica, left side: Secondary | ICD-10-CM | POA: Diagnosis not present

## 2017-02-08 DIAGNOSIS — Z0289 Encounter for other administrative examinations: Secondary | ICD-10-CM

## 2017-02-08 DIAGNOSIS — M533 Sacrococcygeal disorders, not elsewhere classified: Secondary | ICD-10-CM | POA: Diagnosis not present

## 2017-02-08 DIAGNOSIS — G8929 Other chronic pain: Secondary | ICD-10-CM | POA: Diagnosis not present

## 2017-02-08 NOTE — Progress Notes (Signed)
PATIENT: Monica Ballard DOB: 1975/10/11  No chief complaint on file.    HISTORICAL  Monica Ballard is a 40 years old right-handed female, seen in refer by her primary care physician Dr. Carlena Bjornstad [facility] for evaluation of left side low back pain, radiating pain to left lower extremity, initial evaluation was on November 12 2016.  She had a history of anxiety, is taking BuSpar 15 mg every day, had a history of left tympanic membrane repair surgery, mild left-sided decreased hearing.  Around April 2017,  without clear triggers, she has developed low back pain, radiating to her left hip, left thigh, posterior leg, bottom of her foot,   Her left side low back pain, radiating pain to left lower extremity gradually getting worse, she felt constant pressure, 6 out of 10, present 50% of her daytime, has tried over-the-counter Tylenol and ibuprofen heating pad, physical therapy, stretching exercise chiropractor to her manipulation without helping her symptoms.  She denies bowel and bladder incontinence.  We have personally reviewed MRI lumbar in August 2017, mild degenerative disc disease, small L5-S1 disc protrusion, no significant foraminal canal stenosis.  MRI of cervical and thoracic spine showed no significant abnormality.  Today's examination shows significant tenderness around left SI joint  Update February 08 2017: She returned for electrodiagnostic study today that was normal and there was no evidence of left lower extremity neuropathy or left lumbosacral radiculopathy.  We have personally reviewed x-ray of left hip pelvic, there was no acute abnormality notice, mild degenerative changes at both hip,  She continue complains of moderate left SI joints pain, wake her up in the middle of the sleep, despite taking Mobic, heating pad,  REVIEW OF SYSTEMS: Full 14 system review of systems performed and notable only for headache, depression anxiety decreased energy, runny nose, hearing loss,  swelling legs, fatigue chills, cramps,  ALLERGIES: Allergies  Allergen Reactions  . Hydrocodone-Acetaminophen Hives and Swelling    Hives  . Penicillins Hives    HOME MEDICATIONS: Current Outpatient Prescriptions  Medication Sig Dispense Refill  . busPIRone (BUSPAR) 15 MG tablet   0  . ciprofloxacin-dexamethasone (CIPRODEX) otic suspension Place 3 drops into the left ear 3 (three) times daily. 7.5 mL 2  . citalopram (CELEXA) 20 MG tablet Take 20 mg by mouth daily.    . clonazePAM (KLONOPIN) 1 MG tablet Take 1 mg by mouth 2 (two) times daily. Can take up to three times daily, but usually only takes twice a day    . dicyclomine (BENTYL) 10 MG capsule Take 10 mg by mouth 4 (four) times daily -  before meals and at bedtime.    Marland Kitchen ibuprofen (ADVIL,MOTRIN) 800 MG tablet Take 1 tablet (800 mg total) by mouth every 8 (eight) hours as needed for pain. For pain 30 tablet 2  . Multiple Vitamin (MULTIVITAMIN WITH MINERALS) TABS Take 1 tablet by mouth daily.    . pantoprazole (PROTONIX) 40 MG tablet Take 40 mg by mouth daily.    . polyethylene glycol (MIRALAX / GLYCOLAX) packet Take 17 g by mouth daily.    . promethazine (PHENERGAN) 12.5 MG tablet Take 12.5 mg by mouth every 8 (eight) hours as needed for nausea or vomiting.    . promethazine (PHENERGAN) 25 MG suppository Place 1 suppository (25 mg total) rectally every 6 (six) hours as needed for nausea or vomiting. 12 suppository 1   No current facility-administered medications for this visit.     PAST MEDICAL HISTORY: Past Medical  History:  Diagnosis Date  . Anemia   . Anxiety   . Asthma    smoker-uses nebulizer-not used in last year, albuterol rescue inhaler used in 11/13 preoperatively  . Chronic bronchitis (HCC)    nebulizer and rescue inhaler  . Depression   . GERD (gastroesophageal reflux disease)    occasional -tums  . Headache(784.0)   . Hearing loss in left ear   . Hypercholesteremia   . Hyperlipidemia   . Missed ab 01/09/2012     10weeks-used cytotec  . Numbness and tingling   . PONV (postoperative nausea and vomiting)   . Preterm labor   . Teeth decayed    lower , nothing loose-encouraged pt to seek dental care if necessary    PAST SURGICAL HISTORY: Past Surgical History:  Procedure Laterality Date  . CHOLECYSTECTOMY, LAPAROSCOPIC  12/06/2010  . DILATION AND CURETTAGE OF UTERUS  06/11/2012   Procedure: DILATATION AND CURETTAGE;  Surgeon: Geryl RankinsEvelyn Varnado, MD;  Location: [facility];  Service: Gynecology;  Laterality: N/A;  sec procedure start  1241  . DILATION AND EVACUATION  01/16/2012   Procedure: DILATATION AND EVACUATION;  Surgeon: Geryl RankinsEvelyn Varnado, MD;  Location: [facility];  Service: Gynecology;  Laterality: N/A;  . LAPAROSCOPY  06/11/2012   Procedure: LAPAROSCOPY OPERATIVE;  Surgeon: Geryl RankinsEvelyn Varnado, MD;  Location: [facility];  Service: Gynecology;  Laterality: N/A;  . left ear drum rebuilt     x2  . TYMPANOPLASTY Left 04/23/2016   Procedure: TYMPANOPLASTY LEFT EAR;  Surgeon: Serena ColonelJefry Rosen, MD;  Location: [facility];  Service: ENT;  Laterality: Left;  Marland Kitchen. VAGINAL HYSTERECTOMY N/A 09/01/2012   Procedure: HYSTERECTOMY VAGINAL;  Surgeon: Geryl RankinsEvelyn Varnado, MD;  Location: [facility];  Service: Gynecology;  Laterality: N/A;    FAMILY HISTORY: Family History  Problem Relation Age of Onset  . Cancer Unknown        family hx  . Diabetes Unknown        family hx  . Depression Unknown        family hx   . Coronary artery disease Mother        also MVD  . Hyperlipidemia Mother   . Hypertension Mother   . Hypertension Father   . Diabetes Father   . Diabetes Maternal Grandmother   . Colon cancer Paternal Grandmother   . Breast cancer Maternal Aunt   . Anesthesia problems Neg Hx     SOCIAL HISTORY:  Social History   Social History  . Marital status: Married    Spouse name: N/A  . Number of children: 2  . Years of education: GED   Occupational History  . Housewife    Social History Main Topics  .  Smoking status: Former Smoker    Packs/day: 0.25    Years: 8.00    Types: Cigarettes  . Smokeless tobacco: Never Used     Comment: up to 1/2 ppl x 8 years  . Alcohol use No  . Drug use: No  . Sexual activity: Yes    Birth control/ protection: Surgical   Other Topics Concern  . Not on file   Social History Narrative   Lives with daughter, son and husband in a one story home.  Has 2 children.  Does not work at this time.  Education: GED.    Right-handed.   4-5 twelve ounce cans per day.        PHYSICAL EXAM   There were no vitals filed for this visit.  Not  recorded      There is no height or weight on file to calculate BMI.  PHYSICAL EXAMNIATION:  Gen: NAD, conversant, well nourised, obese, well groomed                     Cardiovascular: Regular rate rhythm, no peripheral edema, warm, nontender. Eyes: Conjunctivae clear without exudates or hemorrhage Neck: Supple, no carotid bruits. Pulmonary: Clear to auscultation bilaterally   NEUROLOGICAL EXAM:  MENTAL STATUS: Speech:    Speech is normal; fluent and spontaneous with normal comprehension.  Cognition:     Orientation to time, place and person     Normal recent and remote memory     Normal Attention span and concentration     Normal Language, naming, repeating,spontaneous speech     Fund of knowledge   CRANIAL NERVES: CN II: Visual fields are full to confrontation. Fundoscopic exam is normal with sharp discs and no vascular changes. Pupils are round equal and briskly reactive to light. CN III, IV, VI: extraocular movement are normal. No ptosis. CN V: Facial sensation is intact to pinprick in all 3 divisions bilaterally. Corneal responses are intact.  CN VII: Face is symmetric with normal eye closure and smile. CN VIII: Hearing is normal to rubbing fingers CN IX, X: Palate elevates symmetrically. Phonation is normal. CN XI: Head turning and shoulder shrug are intact CN XII: Tongue is midline with normal  movements and no atrophy.  MOTOR: There is no pronator drift of out-stretched arms. Muscle bulk and tone are normal. Muscle strength is normal.  REFLEXES: Reflexes are 2+ and symmetric at the biceps, triceps, knees, and ankles. Plantar responses are flexor.  SENSORY: Intact to light touch, pinprick, positional sensation and vibratory sensation are intact in fingers and toes.  COORDINATION: Rapid alternating movements and fine finger movements are intact. There is no dysmetria on finger-to-nose and heel-knee-shin.    GAIT/STANCE: Mildly antalgic, able to bear weight with tiptoe and heels Romberg is absent.   DIAGNOSTIC DATA (LABS, IMAGING, TESTING) - I reviewed patient records, labs, notes, testing and imaging myself where available.   ASSESSMENT AND PLAN  Monica Ballard is a 41 y.o. female   Left-sided low back pain, radiating pain to left lower extremity,  EMG nerve conduction study is normal there is no evidence of left lumbar sacral radiculopathy,  Tenderness along left SI joint most suggestive as  musculoskeletal etiology  Continue NSAIDs, Mobic as needed back stretching exercises, heating pad,  Refer her to orthopedic surgeon for continue care  Levert Feinstein, M.D. Ph.D.  [facility] [address], [address] [place], [place] [address] Ph: [phone] Fax: [phone]  CC: Maximiano Coss, MD

## 2017-02-08 NOTE — Procedures (Signed)
Full Name: Elaya Droege Gender: Female MRN #: [id number] Date of Birth: [birth year]    Visit Date: 02/08/2017 10:45 Age: 41 Years 2 Months Old Examining Physician: Levert Feinstein, MD  Referring Physician: Terrace Arabia, MD History:  41 years old female with chronic left side low back pain, left hip pain, radiating pain to left lower extremity. Left SI joint tenderness upon deep palpitation  Summary of the test:  Nerve conduction study: Left superficial peroneal, sural sensory responses were normal. Left peroneal to EDB tibial motor responses were normal.  Left median ulnar sensory and motor responses were normal.  Electromyography: Selected needle examination of left lower extremity muscles and left lumbosacral paraspinal muscles were normal.  Conclusion: This is a normal study, there is no evidence of left lower extremity neuropathy or left lumbosacral radiculopathy.    ------------------------------- Levert Feinstein,  M.D.  [facility] [address] [place], [place] [address] Tel: [phone] Fax: [phone]        [employer]    Nerve / Sites Rec. Site Latency Ref. Amplitude Ref. Rel Amp Segments Distance Velocity Ref. Area    ms ms mV mV %  cm m/s m/s mVms  L Median - APB     Wrist APB 3.2 ?4.4 9.1 ?4.0 100 Wrist - APB 7   36.6     Upper arm APB 7.0  8.7  95.2 Upper arm - Wrist 21 56 ?49 35.2  L Ulnar - ADM     Wrist ADM 2.4 ?3.3 8.2 ?6.0 100 Wrist - ADM 7   37.9     B.Elbow ADM 6.4  7.8  95.3 B.Elbow - Wrist 21 54 ?49 34.7     A.Elbow ADM 8.3  7.7  98.8 A.Elbow - B.Elbow 10 51 ?49 34.6         A.Elbow - Wrist      L Peroneal - EDB     Ankle EDB 4.8 ?6.5 4.1 ?2.0 100 Ankle - EDB 9   14.5     Fib head EDB 11.3  3.8  93.2 Fib head - Ankle 29 45 ?44 14.5     Pop fossa EDB 12.9  4.0  103 Pop fossa - Fib head 8 50 ?44 14.8         Pop fossa - Ankle      L Tibial - AH     Ankle AH 4.1 ?5.8 6.4 ?4.0 100 Ankle - AH 9   23.3     Pop fossa AH 12.3  5.3  83.4 Pop fossa -  Ankle 38 46 ?41 21.8             SNC    Nerve / Sites Rec. Site Peak Lat Ref.  Amp Ref. Segments Distance Peak Diff Ref.    ms ms V V  cm ms ms  L Sural - Ankle (Calf)     Calf Ankle 3.85 ?4.40 8 ?6 Calf - Ankle 14    L Superficial peroneal - Ankle     Lat leg Ankle 4.32 ?4.40 7 ?6 Lat leg - Ankle 14    L Median, Ulnar - Transcarpal comparison     Median Palm Wrist 2.03 ?2.20 48 ?35 Median Palm - Wrist 8       Ulnar Palm Wrist 2.08 ?2.20 18 ?12 Ulnar Palm - Wrist 8          Median Palm - Ulnar Palm  -0.1 ?0.4  L Median - Orthodromic (Dig  II, Mid palm)     Dig II Wrist 3.07 ?3.40 13 ?10 Dig II - Wrist 13    L Ulnar - Orthodromic, (Dig V, Mid palm)     Dig V Wrist 2.86 ?3.10 6 ?5 Dig V - Wrist 5611                 F  Wave    Nerve F Lat Ref.   ms ms  L Ulnar - ADM 26.6 ?32.0  L Tibial - AH 52.0 ?56.0         EMG full       EMG Summary Table    Spontaneous MUAP Recruitment  Muscle IA Fib PSW Fasc Other Amp Dur. Poly Pattern  L. Tibialis anterior Normal None None None _______ Normal Normal Normal Normal  L. Tibialis posterior Normal None None None _______ Normal Normal Normal Normal  L. Peroneus longus Normal None None None _______ Normal Normal Normal Normal  L. Vastus lateralis Normal None None None _______ Normal Normal Normal Normal  L. Gluteus medius Normal None None None _______ Normal Normal Normal Normal  L. Lumbar paraspinals Normal None None None _______ Normal Normal Normal Normal

## 2017-06-17 DIAGNOSIS — J309 Allergic rhinitis, unspecified: Secondary | ICD-10-CM | POA: Insufficient documentation

## 2017-06-17 DIAGNOSIS — K219 Gastro-esophageal reflux disease without esophagitis: Secondary | ICD-10-CM | POA: Insufficient documentation

## 2017-06-17 DIAGNOSIS — F419 Anxiety disorder, unspecified: Secondary | ICD-10-CM | POA: Insufficient documentation

## 2017-07-04 ENCOUNTER — Other Ambulatory Visit: Payer: Self-pay

## 2017-07-04 ENCOUNTER — Encounter (HOSPITAL_COMMUNITY): Payer: Self-pay | Admitting: Emergency Medicine

## 2017-07-04 ENCOUNTER — Emergency Department (HOSPITAL_COMMUNITY)
Admission: EM | Admit: 2017-07-04 | Discharge: 2017-07-04 | Disposition: A | Discharge status: Home / Self Care | Payer: BLUE CROSS/BLUE SHIELD | Attending: Emergency Medicine | Admitting: Emergency Medicine

## 2017-07-04 ENCOUNTER — Emergency Department (HOSPITAL_COMMUNITY): Payer: BLUE CROSS/BLUE SHIELD

## 2017-07-04 DIAGNOSIS — Z87891 Personal history of nicotine dependence: Secondary | ICD-10-CM | POA: Diagnosis not present

## 2017-07-04 DIAGNOSIS — J45909 Unspecified asthma, uncomplicated: Secondary | ICD-10-CM | POA: Insufficient documentation

## 2017-07-04 DIAGNOSIS — R079 Chest pain, unspecified: Secondary | ICD-10-CM | POA: Diagnosis present

## 2017-07-04 DIAGNOSIS — R0789 Other chest pain: Secondary | ICD-10-CM | POA: Diagnosis not present

## 2017-07-04 DIAGNOSIS — Z79899 Other long term (current) drug therapy: Secondary | ICD-10-CM | POA: Insufficient documentation

## 2017-07-04 LAB — TROPONIN I: Troponin I: <0.03 ng/mL (ref <0.03)

## 2017-07-04 LAB — COMPREHENSIVE METABOLIC PANEL
ALBUMIN: 4.1 g/dL (ref 3.5–5.0)
ALT: 23 U/L (ref 14–54)
AST: 30 U/L (ref 15–41)
Alkaline Phosphatase: 77 U/L (ref 38–126)
Anion gap: 10 (ref 5–15)
BUN: 9 mg/dL (ref 6–20)
CHLORIDE: 103 mmol/L (ref 101–111)
CO2: 24 mmol/L (ref 22–32)
Calcium: 9.6 mg/dL (ref 8.9–10.3)
Creatinine, Ser: 0.83 mg/dL (ref 0.44–1.00)
GFR calc Af Amer: >60 mL/min (ref >60)
GFR calc non Af Amer: >60 mL/min (ref >60)
GLUCOSE: 114 mg/dL — AB (ref 65–99)
POTASSIUM: 4 mmol/L (ref 3.5–5.1)
SODIUM: 137 mmol/L (ref 135–145)
Total Bilirubin: 0.5 mg/dL (ref 0.3–1.2)
Total Protein: 7.3 g/dL (ref 6.5–8.1)

## 2017-07-04 LAB — CBC WITH DIFFERENTIAL/PLATELET
BASOS ABS: 0 10*3/uL (ref 0.0–0.1)
BASOS PCT: 0 %
EOS PCT: 0 %
Eosinophils Absolute: 0 10*3/uL (ref 0.0–0.7)
HEMATOCRIT: 44.2 % (ref 36.0–46.0)
Hemoglobin: 14.6 g/dL (ref 12.0–15.0)
Lymphocytes Relative: 37 %
Lymphs Abs: 3.1 10*3/uL (ref 0.7–4.0)
MCH: 29.4 pg (ref 26.0–34.0)
MCHC: 33 g/dL (ref 30.0–36.0)
MCV: 89.1 fL (ref 78.0–100.0)
MONO ABS: 0.9 10*3/uL (ref 0.1–1.0)
Monocytes Relative: 11 %
NEUTROS ABS: 4.4 10*3/uL (ref 1.7–7.7)
Neutrophils Relative %: 52 %
PLATELETS: 287 10*3/uL (ref 150–400)
RBC: 4.96 MIL/uL (ref 3.87–5.11)
RDW: 14.3 % (ref 11.5–15.5)
WBC: 8.4 10*3/uL (ref 4.0–10.5)

## 2017-07-04 LAB — LIPASE, BLOOD: Lipase: 21 U/L (ref 11–51)

## 2017-07-04 LAB — D-DIMER, QUANTITATIVE: D-Dimer, Quant: 0.33 ug/mL-FEU (ref 0.00–0.50)

## 2017-07-04 MED ORDER — GI COCKTAIL ~~LOC~~
30.0000 mL | Freq: Once | ORAL | Status: AC
  Administered 2017-07-04: 30 mL via ORAL
  Filled 2017-07-04: qty 30

## 2017-07-04 MED ORDER — SUCRALFATE 1 G PO TABS
1.0000 g | ORAL_TABLET | Freq: Once | ORAL | Status: AC
  Administered 2017-07-04: 1 g via ORAL
  Filled 2017-07-04: qty 1

## 2017-07-04 MED ORDER — SUCRALFATE 1 G PO TABS: 1.0000 g | ORAL_TABLET | Freq: Three times a day (TID) | ORAL | 0 refills | Status: DC

## 2017-07-04 MED ORDER — FAMOTIDINE 20 MG PO TABS
20.0000 mg | ORAL_TABLET | Freq: Once | ORAL | Status: AC
  Administered 2017-07-04: 20 mg via ORAL
  Filled 2017-07-04: qty 1

## 2017-07-04 MED ORDER — PANTOPRAZOLE SODIUM 40 MG PO TBEC: 40.0000 mg | DELAYED_RELEASE_TABLET | Freq: Every day | ORAL | 0 refills | Status: DC

## 2017-07-04 NOTE — ED Provider Notes (Signed)
[facility] EMERGENCY DEPARTMENT Provider Note   CSN: [id number] Arrival date & time: 07/04/17  1324     History   Chief Complaint Chief Complaint  Patient presents with  . Chest Pain    HPI Monica Ballard is a 41 y.o. female.  Patient with history of acid reflux presenting with chest pain that woke her from sleep about 11 PM.  She reports the pain is constant and is worse with deep breathing, and lying flat.  The pain is better when she sits up.  She is taken Tums and Gas-X without relief.  She said nausea but no vomiting.  The pain radiates to her right arm and upper back.  She denies any shortness of breath, vomiting or diaphoresis.  Denies any cardiac history.  No history of hypertension or diabetes.  She is not a smoker.  Reports she had an EGD that showed gastritis as well as hiatal hernia and has been on Protonix for several years.  She has not had this pain in the past.  Denies any alcohol use.  She has been taking more NSAIDs than usual because she recently had ear surgery.  She no longer has a gallbladder.    Chest Pain   Associated symptoms include abdominal pain and nausea. Pertinent negatives include no back pain, no dizziness, no fever, no headaches, no shortness of breath, no vomiting and no weakness.    Past Medical History:  Diagnosis Date  . Anemia   . Anxiety   . Asthma    smoker-uses nebulizer-not used in last year, albuterol rescue inhaler used in 11/13 preoperatively  . Chronic bronchitis (HCC)    nebulizer and rescue inhaler  . Depression   . GERD (gastroesophageal reflux disease)    occasional -tums  . Headache(784.0)   . Hearing loss in left ear   . Hypercholesteremia   . Hyperlipidemia   . Missed ab 01/09/2012   10weeks-used cytotec  . Numbness and tingling   . PONV (postoperative nausea and vomiting)   . Preterm labor   . Teeth decayed    lower , nothing loose-encouraged pt to seek dental care if necessary    Patient Active Problem List   Diagnosis Date Noted  . 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 Osteoarthritis 11/13/2016  . Left low back pain 11/12/2016  . HEADACHE 01/25/2010  . CHEST PAIN 01/25/2010    Past Surgical History:  Procedure Laterality Date  . CHOLECYSTECTOMY, LAPAROSCOPIC  12/06/2010  . DILATION AND CURETTAGE OF UTERUS  06/11/2012   Procedure: DILATATION AND CURETTAGE;  Surgeon: Geryl Rankins, MD;  Location: [facility];  Service: Gynecology;  Laterality: N/A;  sec procedure start  1241  . DILATION AND EVACUATION  01/16/2012   Procedure: DILATATION AND EVACUATION;  Surgeon: Geryl Rankins, MD;  Location: [facility];  Service: Gynecology;  Laterality: N/A;  . LAPAROSCOPY  06/11/2012   Procedure: LAPAROSCOPY OPERATIVE;  Surgeon: Geryl Rankins, MD;  Location: [facility];  Service: Gynecology;  Laterality: N/A;  . left ear drum rebuilt     x2  . TYMPANOPLASTY Left 04/23/2016   Procedure: TYMPANOPLASTY LEFT EAR;  Surgeon: Serena Colonel, MD;  Location: [facility];  Service: ENT;  Laterality: Left;  Marland Kitchen VAGINAL HYSTERECTOMY N/A 09/01/2012   Procedure: HYSTERECTOMY VAGINAL;  Surgeon: Geryl Rankins, MD;  Location: [facility];  Service: Gynecology;  Laterality: N/A;    OB History    Gravida Para Term Preterm AB Living   3 2 1  1  2   SAB TAB Ectopic Multiple Live Births           2       Home Medications    Prior to Admission medications   Medication Sig Start Date End Date Taking? Authorizing Provider  busPIRone (BUSPAR) 15 MG tablet  02/20/16  Yes [provider]  citalopram (CELEXA) 20 MG tablet Take 20 mg by mouth daily.   Yes [provider]  clonazePAM (KLONOPIN) 1 MG tablet Take 1 mg by mouth 2 (two) times daily. Can take up to three times daily, but usually only takes twice a day   Yes [provider]  dicyclomine (BENTYL) 10 MG capsule Take 10 mg by mouth 4 (four) times daily -  before meals and at bedtime.   Yes [provider]  pantoprazole (PROTONIX) 40 MG tablet  Take 40 mg by mouth daily.   Yes [provider]  polyethylene glycol (MIRALAX / GLYCOLAX) packet Take 17 g by mouth daily.   Yes [provider]  promethazine (PHENERGAN) 12.5 MG tablet Take 12.5 mg by mouth every 8 (eight) hours as needed for nausea or vomiting.   Yes [provider]  promethazine (PHENERGAN) 25 MG suppository Place 1 suppository (25 mg total) rectally every 6 (six) hours as needed for nausea or vomiting. 04/23/16  Yes Serena Colonelosen, Jefry, MD  ciprofloxacin-dexamethasone [facility](CIPRODEX) otic suspension Place 3 drops into the left ear 3 (three) times daily. 04/23/16   Serena Colonelosen, Jefry, MD  diclofenac sodium (VOLTAREN) 1 % GEL Apply 4 g topically 4 (four) times daily as needed. 11/12/16   Levert FeinsteinYan, Yijun, MD  ibuprofen (ADVIL,MOTRIN) 800 MG tablet Take 1 tablet (800 mg total) by mouth every 8 (eight) hours as needed for pain. For pain 09/02/12   Gerald Leitzole, Tara, MD  meloxicam (MOBIC) 15 MG tablet Take 1 tablet (15 mg total) by mouth daily. One tab after meal 11/12/16   Levert FeinsteinYan, Yijun, MD  Multiple Vitamin (MULTIVITAMIN WITH MINERALS) TABS Take 1 tablet by mouth daily.    [provider]    Family History Family History  Problem Relation Age of Onset  . Cancer Unknown        family hx  . Diabetes Unknown        family hx  . Depression Unknown        family hx   . Coronary artery disease Mother        also MVD  . Hyperlipidemia Mother   . Hypertension Mother   . Hypertension Father   . Diabetes Father   . Diabetes Maternal Grandmother   . Colon cancer Paternal Grandmother   . Breast cancer Maternal Aunt   . Anesthesia problems Neg Hx     Social History Social History   Tobacco Use  . Smoking status: Former Smoker    Packs/day: 0.25    Years: 8.00    Pack years: 2.00    Types: Cigarettes  . Smokeless tobacco: Never Used  . Tobacco comment: up to 1/2 ppl x 8 years  Substance Use Topics  . Alcohol use: No  . Drug use: No     Allergies     Hydrocodone-acetaminophen and Penicillins   Review of Systems Review of Systems  Constitutional: Negative for activity change, appetite change and fever.  HENT: Negative for congestion and rhinorrhea.   Eyes: Negative for visual disturbance.  Respiratory: Positive for chest tightness. Negative for shortness of breath.   Cardiovascular: Positive for chest pain.  Gastrointestinal: Positive for abdominal pain and nausea. Negative for vomiting.  Genitourinary: Negative for dysuria, hematuria and vaginal bleeding.  Musculoskeletal: Negative for arthralgias, back pain and myalgias.  Skin: Negative for rash.  Neurological: Negative for dizziness, weakness and headaches.   all other systems are negative except as noted in the HPI and PMH.     Physical Exam Updated Vital Signs BP 131/78 (BP Location: Left Arm)   Pulse 74   Resp 16   Ht 5\' 7"  (1.702 m)   Wt 88.5 kg (195 lb)   LMP 08/25/2012   SpO2 96%   BMI 30.54 kg/m   Physical Exam  Constitutional: She is oriented to person, place, and time. She appears well-developed and well-nourished. No distress.  HENT:  Head: Normocephalic and atraumatic.  Mouth/Throat: Oropharynx is clear and moist. No oropharyngeal exudate.  Eyes: Conjunctivae and EOM are normal. Pupils are equal, round, and reactive to light.  Neck: Normal range of motion. Neck supple.  No meningismus.  Cardiovascular: Normal rate, regular rhythm, normal heart sounds and intact distal pulses.  No murmur heard. Pulmonary/Chest: Effort normal and breath sounds normal. No respiratory distress. She exhibits no tenderness.  Abdominal: Soft. There is no tenderness. There is no rebound and no guarding.  No RUQ and epigastric tenderness. Pain not worse with R arm movement.   Musculoskeletal: Normal range of motion. She exhibits no edema or tenderness.  Neurological: She is alert and oriented to person, place, and time. No cranial nerve deficit. She exhibits normal muscle tone.  Coordination normal.  No ataxia on finger to nose bilaterally. No pronator drift. 5/5 strength throughout. CN 2-12 intact.Equal grip strength. Sensation intact.   Skin: Skin is warm.  Psychiatric: She has a normal mood and affect. Her behavior is normal.  Nursing note and vitals reviewed.    ED Treatments / Results  Labs (all labs ordered are listed, but only abnormal results are displayed) Labs Reviewed  COMPREHENSIVE METABOLIC PANEL - Abnormal; Notable for the following components:      Result Value   Glucose, Bld 114 (*)    All other components within normal limits  CBC WITH DIFFERENTIAL/PLATELET  LIPASE, BLOOD  TROPONIN I  D-DIMER, QUANTITATIVE (NOT AT [facility])  TROPONIN I    EKG  EKG Interpretation  Date/Time:  Thursday July 04 2017 03:40:44 EST Ventricular Rate:  69 PR Interval:    QRS Duration: 96 QT Interval:  444 QTC Calculation: 476 R Axis:   73 Text Interpretation:  Sinus rhythm nonspecific T wave changes v3 Confirmed by Glynn Octave [phone]) on 07/04/2017 3:54:16 AM       Radiology Dg Chest 2 View  Result Date: 07/04/2017 CLINICAL DATA:  Initial evaluation for acute chest pain. EXAM: CHEST  2 VIEW COMPARISON:  Prior radiograph from 01/19/2010. FINDINGS: Cardiac and mediastinal silhouettes are stable in size and contour, and remain within normal limits. Lungs normally inflated. Patchy and linear opacity within the lingula, favored to reflect atelectasis and/or scarring. Infiltrate not entirely excluded. No other focal airspace disease. No pulmonary edema or pleural effusion. No pneumothorax. No acute osseous abnormality. IMPRESSION: 1. Patchy and linear opacity at the left lung base/lingula. Atelectasis and/or scarring is favored. Infiltrate not entirely excluded, and could be considered in the correct clinical setting. 2. No other active cardiopulmonary disease. Electronically Signed   By: Rise Mu M.D.   On: 07/04/2017 05:16     Procedures Procedures (including critical care time)  Medications Ordered in ED Medications  gi cocktail (  Maalox,Lidocaine,Donnatal) (not administered)     Initial Impression / Assessment and Plan / ED Course  I have reviewed the triage vital signs and the nursing notes.  Pertinent labs & imaging results that were available during my care of the patient were reviewed by me and considered in my medical decision making (see chart for details).    Patient with central chest pain that radiates to the right side that has been constant since about 11 PM.  Associated with nausea but no vomiting.  EKG is nonischemic.  Nonspecific T wave change in V3. Pain is not reproducible.  GI cocktail given.  Low suspicion for ACS.  Troponin and d-dimer negative.  LFTs and lipase normal.  Troponin negative x2.  Heart score is 1.  Low suspicion for ACS. Chest x-ray does show questionable left basilar infiltrate but patient has no cough or fever.  No leukocytosis.  Suspect this is atelectasis versus scarring.  Troponin negative after about 7 hours of continuous pain.  Doubt ACS.  Patient will be treated with Carafate and continue her PPI.  Follow-up with cardiology for stress test.  Return precautions discussed.   Final Clinical Impressions(s) / ED Diagnoses   Final diagnoses:  Atypical chest pain    ED Discharge Orders    None       Jeziah Kretschmer, Jeannett SeniorStephen, MD 07/04/17 [phone]

## 2017-07-04 NOTE — Discharge Instructions (Signed)
There is no evidence of heart attack or blood clot in the lung. Continue your protonix and carafate. Follow up with the cardiologist for a stress test. Return to the ED if you develop new or worsening symptoms.

## 2017-07-04 NOTE — ED Triage Notes (Signed)
Pt C/O chest pain that woke her up from sleep around around 0030. Pt has taken Tums and GasX with no relief. Pt states she has had N/V. Pt states the pain radiates down her right arm and tenderness in her jaw.

## 2019-09-09 ENCOUNTER — Other Ambulatory Visit: Payer: Self-pay

## 2019-09-09 ENCOUNTER — Ambulatory Visit (INDEPENDENT_AMBULATORY_CARE_PROVIDER_SITE_OTHER): Payer: BLUE CROSS/BLUE SHIELD | Admitting: Professional

## 2019-09-09 DIAGNOSIS — F411 Generalized anxiety disorder: Secondary | ICD-10-CM | POA: Insufficient documentation

## 2019-09-09 DIAGNOSIS — F330 Major depressive disorder, recurrent, mild: Secondary | ICD-10-CM | POA: Insufficient documentation

## 2019-09-09 DIAGNOSIS — F331 Major depressive disorder, recurrent, moderate: Secondary | ICD-10-CM | POA: Insufficient documentation
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
## 2019-09-15 ENCOUNTER — Ambulatory Visit (HOSPITAL_COMMUNITY): Payer: BLUE CROSS/BLUE SHIELD | Admitting: Licensed Clinical Social Worker

## 2019-09-16 ENCOUNTER — Ambulatory Visit (HOSPITAL_COMMUNITY): Payer: BLUE CROSS/BLUE SHIELD | Admitting: Licensed Clinical Social Worker

## 2019-09-28 ENCOUNTER — Ambulatory Visit (HOSPITAL_COMMUNITY): Payer: BLUE CROSS/BLUE SHIELD | Admitting: Psychiatry

## 2019-10-07 ENCOUNTER — Other Ambulatory Visit: Payer: Self-pay

## 2019-10-07 ENCOUNTER — Ambulatory Visit (INDEPENDENT_AMBULATORY_CARE_PROVIDER_SITE_OTHER): Payer: BLUE CROSS/BLUE SHIELD | Admitting: Psychiatry

## 2019-10-07 ENCOUNTER — Encounter (HOSPITAL_COMMUNITY): Payer: Self-pay | Admitting: Psychiatry

## 2019-10-07 DIAGNOSIS — F411 Generalized anxiety disorder: Secondary | ICD-10-CM

## 2019-10-07 DIAGNOSIS — F331 Major depressive disorder, recurrent, moderate: Secondary | ICD-10-CM

## 2019-10-07 DIAGNOSIS — F341 Dysthymic disorder: Secondary | ICD-10-CM | POA: Insufficient documentation

## 2019-10-07 MED ORDER — ZOLPIDEM TARTRATE 5 MG PO TABS: 5.0000 mg | ORAL_TABLET | Freq: Every evening | ORAL | 0 refills | Status: DC | PRN

## 2019-10-07 MED ORDER — CLONAZEPAM 1 MG PO TABS: 1.0000 mg | ORAL_TABLET | Freq: Three times a day (TID) | ORAL | 1 refills | Status: DC

## 2019-10-07 MED ORDER — ESCITALOPRAM OXALATE 20 MG PO TABS: 20.0000 mg | ORAL_TABLET | Freq: Every day | ORAL | 1 refills | Status: DC

## 2019-10-07 MED ORDER — BUPROPION HCL ER (XL) 300 MG PO TB24: 300.0000 mg | ORAL_TABLET | Freq: Every day | ORAL | 2 refills | Status: DC

## 2019-10-07 NOTE — Progress Notes (Signed)
Psychiatric Initial Adult Assessment   Patient Identification: Monica Ballard MRN:  [id number] Date of Evaluation:  10/07/2019 Referral Source: Milana Na Chief Complaint:  Chronic depression, anxiety, insomnia. Visit Diagnosis:    ICD-10-CM   1. Major depressive disorder, recurrent episode, moderate (HCC)  F33.1   2. Generalized anxiety disorder  F41.1   3. 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 I discussed the limitations of evaluation and management by telemedicine and  the availability of in person appointments. Patient expressed understanding and agreed to proceed.  History of Present Illness:  Patient is a 44 yo married white female initially referred by her PCP for treatment of chronic depression and anxiety resistant to treatment. Monica Ballard reports being depressed and anxious (excessive worrying, unable to relax) for close to 20 years. It started in 2003 with loss of mother and grandmother within few days of each other. She has been tried by her PCP on multiple antidepressants (fluoxetine, sertraline, paroxetine, trazodone, citalopram, bupropion, venlafaxine, duloxetine) as well as buspirone for anxiety. She is now on Lexapro 20 mg and Wellbutrin XL 300 mg for past 3 months or so. None of these trials was successful as her depression and anxiety have not remitted. She also has been prescribed clonazepam for many years - now on 1 mg tod. She has never been suicidal, never psychiatrically hospitalized and has never been under care of a psychiatrist. She has no hx of mania, psychosis, alcohol or drug abuse. She reports recent decline of appetite but attributes it to taking Wellbutrin. She has no energy, poor sleep, low self esteem, anhedonia, poor concentration. All these symptoms are chronic.  Medical hx reviewed and is not likely to be contributory. Family hx  significant for half brother with bipolar disorder (same mother).   Associated Signs/Symptoms: Depression Symptoms:  depressed mood, anhedonia, fatigue, feelings of worthlessness/guilt, difficulty concentrating, hopelessness, anxiety, disturbed sleep, decreased appetite, (Hypo) Manic Symptoms:  Irritable Mood, Anxiety Symptoms:  Excessive Worry, Psychotic Symptoms:  None. PTSD Symptoms: Negative  Past Psychiatric History: See above.  Previous Psychotropic Medications: Yes   Substance Abuse History in the last 12 months:  No.  Consequences of Substance Abuse: NA  Past Medical History:  Past Medical History:  Diagnosis Date  . Anemia   . Anxiety   . Asthma    smoker-uses nebulizer-not used in last year, albuterol rescue inhaler used in 11/13 preoperatively  . Chronic bronchitis (HCC)    nebulizer and rescue inhaler  . Depression   . GERD (gastroesophageal reflux disease)    occasional -tums  . Headache(784.0)   . Hearing loss in left ear   . Hypercholesteremia   . Hyperlipidemia   . Missed ab 01/09/2012   10weeks-used cytotec  . Numbness and tingling   . PONV (postoperative nausea and vomiting)   . Preterm labor   . Teeth decayed    lower , nothing loose-encouraged pt to seek dental care if necessary    Past Surgical History:  Procedure Laterality Date  . CHOLECYSTECTOMY, LAPAROSCOPIC  12/06/2010  . DILATION AND CURETTAGE OF UTERUS  06/11/2012   Procedure: DILATATION AND CURETTAGE;  Surgeon: Geryl Rankins, MD;  Location: [facility];  Service: Gynecology;  Laterality: N/A;  sec procedure start  1241  . DILATION AND EVACUATION  01/16/2012   Procedure: DILATATION AND EVACUATION;  Surgeon: Geryl Rankins, MD;  Location: [facility];  Service:  Gynecology;  Laterality: N/A;  . LAPAROSCOPY  06/11/2012   Procedure: LAPAROSCOPY OPERATIVE;  Surgeon: Geryl Rankins, MD;  Location: [facility];  Service: Gynecology;  Laterality: N/A;  . left ear drum rebuilt     x2  . TYMPANOPLASTY  Left 04/23/2016   Procedure: TYMPANOPLASTY LEFT EAR;  Surgeon: Serena Colonel, MD;  Location: [facility];  Service: ENT;  Laterality: Left;  Marland Kitchen VAGINAL HYSTERECTOMY N/A 09/01/2012   Procedure: HYSTERECTOMY VAGINAL;  Surgeon: Geryl Rankins, MD;  Location: [facility];  Service: Gynecology;  Laterality: N/A;    Family Psychiatric History: Half brother with bipolar disorder/ADHD.  Family History:  Family History  Problem Relation Age of Onset  . Cancer Other        family hx  . Diabetes Other        family hx  . Depression Other        family hx   . Coronary artery disease Mother        also MVD  . Hyperlipidemia Mother   . Hypertension Mother   . Hypertension Father   . Diabetes Father   . Diabetes Maternal Grandmother   . Colon cancer Paternal Grandmother   . Breast cancer Maternal Aunt   . Anesthesia problems Neg Hx     Social History:   Social History   Socioeconomic History  . Marital status: Married    Spouse name: Not on file  . Number of children: 2  . Years of education: GED  . Highest education level: Not on file  Occupational History  . Occupation: Housewife  Tobacco Use  . Smoking status: Former Smoker    Packs/day: 0.25    Years: 8.00    Pack years: 2.00    Types: Cigarettes  . Smokeless tobacco: Never Used  . Tobacco comment: up to 1/2 ppl x 8 years  Substance and Sexual Activity  . Alcohol use: No  . Drug use: No  . Sexual activity: Yes    Birth control/protection: Surgical  Other Topics Concern  . Not on file  Social History Narrative   Lives with daughter, son and husband in a one story home.  Has 2 children.  Does not work at this time.  Education: GED.    Right-handed.   4-5 twelve ounce cans per day.   Social Determinants of Health   Financial Resource Strain:   . Difficulty of Paying Living Expenses:   Food Insecurity:   . Worried About Programme researcher, broadcasting/film/video in the Last Year:   . Barista in the Last Year:   Transportation  Needs:   . Freight forwarder (Medical):   Marland Kitchen Lack of Transportation (Non-Medical):   Physical Activity:   . Days of Exercise per Week:   . Minutes of Exercise per Session:   Stress:   . Feeling of Stress :   Social Connections:   . Frequency of Communication with Friends and Family:   . Frequency of Social Gatherings with Friends and Family:   . Attends Religious Services:   . Active Member of Clubs or Organizations:   . Attends Banker Meetings:   Marland Kitchen Marital Status:     Allergies: Allergies  Allergen Reactions  . Hydrocodone-Acetaminophen Hives and Swelling    Hives  . Penicillins Hives    Metabolic Disorder Labs: No results found for: HGBA1C, MPG No results found for: PROLACTIN No results found for: CHOL, TRIG, HDL, CHOLHDL, VLDL, LDLCALC No results found  for: TSH  Therapeutic Level Labs: No results found for: LITHIUM No results found for: CBMZ No results found for: VALPROATE  Current Medications: Current Outpatient Medications  Medication Sig Dispense Refill  . buPROPion (WELLBUTRIN XL) 300 MG 24 hr tablet Take 1 tablet (300 mg total) by mouth daily. 30 tablet 2  . busPIRone (BUSPAR) 15 MG tablet   0  . ciprofloxacin-dexamethasone (CIPRODEX) otic suspension Place 3 drops into the left ear 3 (three) times daily. 7.5 mL 2  . clonazePAM (KLONOPIN) 1 MG tablet Take 1 tablet (1 mg total) by mouth 3 (three) times daily. Can take up to three times daily, but usually only takes twice a day 90 tablet 1  . diclofenac sodium (VOLTAREN) 1 % GEL Apply 4 g topically 4 (four) times daily as needed. 100 g 11  . dicyclomine (BENTYL) 10 MG capsule Take 10 mg by mouth 4 (four) times daily -  before meals and at bedtime.    Marland Kitchen escitalopram (LEXAPRO) 20 MG tablet Take 1 tablet (20 mg total) by mouth at bedtime. 30 tablet 1  . ibuprofen (ADVIL,MOTRIN) 800 MG tablet Take 1 tablet (800 mg total) by mouth every 8 (eight) hours as needed for pain. For pain 30 tablet 2  .  meloxicam (MOBIC) 15 MG tablet Take 1 tablet (15 mg total) by mouth daily. One tab after meal 30 tablet 11  . Multiple Vitamin (MULTIVITAMIN WITH MINERALS) TABS Take 1 tablet by mouth daily.    . pantoprazole (PROTONIX) 40 MG tablet Take 1 tablet (40 mg total) by mouth daily. 30 tablet 0  . polyethylene glycol (MIRALAX / GLYCOLAX) packet Take 17 g by mouth daily.    . promethazine (PHENERGAN) 12.5 MG tablet Take 12.5 mg by mouth every 8 (eight) hours as needed for nausea or vomiting.    . promethazine (PHENERGAN) 25 MG suppository Place 1 suppository (25 mg total) rectally every 6 (six) hours as needed for nausea or vomiting. 12 suppository 1  . sucralfate (CARAFATE) 1 g tablet Take 1 tablet (1 g total) by mouth 4 (four) times daily -  with meals and at bedtime. 30 tablet 0  . zolpidem (AMBIEN) 5 MG tablet Take 1 tablet (5 mg total) by mouth at bedtime as needed and may repeat dose one time if needed for sleep. 30 tablet 0   No current facility-administered medications for this visit.    Psychiatric Specialty Exam: Review of Systems  Constitutional: Positive for fatigue.  Psychiatric/Behavioral: Positive for decreased concentration and sleep disturbance. The patient is nervous/anxious.   All other systems reviewed and are negative.   Last menstrual period 08/25/2012.There is no height or weight on file to calculate BMI.  General Appearance: Casual and Fairly Groomed  Eye Contact:  Good  Speech:  Clear and Coherent and Normal Rate  Volume:  Normal  Mood:  Anxious and Depressed  Affect:  Non-Congruent and Full Range  Thought Process:  Goal Directed and Linear  Orientation:  Full (Time, Place, and Person)  Thought Content:  Rumination  Suicidal Thoughts:  No  Homicidal Thoughts:  No  Memory:  Immediate;   Good Recent;   Good Remote;   Good  Judgement:  Good  Insight:  Fair  Psychomotor Activity:  Normal  Concentration:  Concentration: Fair  Recall:  Good  Fund of Knowledge:Good   Language: Good  Akathisia:  Negative  Handed:  Right  AIMS (if indicated):  not done  Assets:  Communication Skills Desire for Improvement Financial  Resources/Insurance Housing Resilience Social Support  ADL's:  Intact  Cognition: WNL  Sleep:  Poor    Assessment and Plan: 44 yo married white female initially referred by her PCP for treatment of chronic depression and anxiety resistant to treatment. Monica Ballard reports being depressed and anxious (excessive worrying, unable to relax) for close to 20 years. It started in 2003 with loss of mother and grandmother within few days of each other. She has been tried by her PCP on multiple antidepressants (fluoxetine, sertraline, paroxetine, trazodone, citalopram, bupropion, venlafaxine, duloxetine) as well as buspirone for anxiety. She is now on Lexapro 20 mg and Wellbutrin XL 300 mg for past 3 months or so. None of these trials was successful as her depression and anxiety have not remitted. She also has been prescribed clonazepam for many years - now on 1 mg tod. She has never been suicidal, never psychiatrically hospitalized and has never been under care of a psychiatrist. She has no hx of mania, psychosis, alcohol or drug abuse. She reports recent decline of appetite but attributes it to taking Wellbutrin. She has no energy, poor sleep, low self esteem, anhedonia, poor concentration. All these symptoms are chronic.  Dx: Dysthymic disorder/MDD, GAD  Plan: Given that she has failed multiple medications we will do genetic testing prior to recommending antidepressant change. While she does not endorse having any clear hypomanic episodes these therapeutic failures combined with family hx of BPAD may indicate presence of bipolar depression and may call for a different therapeutic approach. I will continue clonazepam unchanged and add zolpidem prn insomnia. Next appointment in 5 weeks but I will call patients earlier once results of Gene[employer]ight test are back. She  was supposed to have initial counseling visit but it was not yet scheduled/rescheduled. The plan was discussed with patient who had an opportunity to ask questions and these were all answered. I spend 60 minutes in videoconferencing with the patient and devoted approximately 50% of this time to explanation of diagnosis, discussion of treatment options and med education.  Stephanie Acre, MD 3/17/20211:43 PM

## 2019-10-12 ENCOUNTER — Ambulatory Visit (HOSPITAL_COMMUNITY): Payer: Self-pay | Admitting: *Deleted

## 2019-10-12 ENCOUNTER — Other Ambulatory Visit: Payer: Self-pay

## 2019-10-12 DIAGNOSIS — F331 Major depressive disorder, recurrent, moderate: Secondary | ICD-10-CM

## 2019-10-12 DIAGNOSIS — F411 Generalized anxiety disorder: Secondary | ICD-10-CM

## 2019-10-12 DIAGNOSIS — F341 Dysthymic disorder: Secondary | ICD-10-CM

## 2019-10-12 NOTE — Progress Notes (Signed)
Pt in clinic today for Genesight testing. Consent signed. Specimen obtained. Pt pleasant and cooperative.

## 2019-10-21 ENCOUNTER — Ambulatory Visit (HOSPITAL_COMMUNITY): Payer: Self-pay | Admitting: Licensed Clinical Social Worker

## 2019-10-28 ENCOUNTER — Ambulatory Visit (HOSPITAL_COMMUNITY): Payer: Self-pay | Admitting: Licensed Clinical Social Worker

## 2019-10-28 ENCOUNTER — Other Ambulatory Visit: Payer: Self-pay

## 2019-10-28 ENCOUNTER — Telehealth (HOSPITAL_COMMUNITY): Payer: Self-pay | Admitting: Licensed Clinical Social Worker

## 2019-10-28 NOTE — Telephone Encounter (Signed)
Pt did not present for webex appointment. Emailed patient reminder of session. Pt did not join. Ottis Stain, LCAS

## 2019-11-17 ENCOUNTER — Other Ambulatory Visit: Payer: Self-pay

## 2019-11-17 ENCOUNTER — Telehealth (HOSPITAL_COMMUNITY): Payer: Self-pay

## 2019-11-17 ENCOUNTER — Telehealth (INDEPENDENT_AMBULATORY_CARE_PROVIDER_SITE_OTHER): Payer: Commercial Managed Care - PPO | Admitting: Psychiatry

## 2019-11-17 DIAGNOSIS — F411 Generalized anxiety disorder: Secondary | ICD-10-CM

## 2019-11-17 DIAGNOSIS — F331 Major depressive disorder, recurrent, moderate: Secondary | ICD-10-CM

## 2019-11-17 DIAGNOSIS — F341 Dysthymic disorder: Secondary | ICD-10-CM | POA: Diagnosis not present

## 2019-11-17 MED ORDER — ESZOPICLONE 2 MG PO TABS: 2.0000 mg | ORAL_TABLET | Freq: Every evening | ORAL | 1 refills | Status: DC | PRN

## 2019-11-17 MED ORDER — LURASIDONE HCL 20 MG PO TABS: 20.0000 mg | ORAL_TABLET | Freq: Every day | ORAL | 1 refills | Status: DC

## 2019-11-17 MED ORDER — CLONAZEPAM 1 MG PO TABS: 1.0000 mg | ORAL_TABLET | Freq: Three times a day (TID) | ORAL | 1 refills | Status: DC

## 2019-11-17 NOTE — Telephone Encounter (Signed)
Prior authorization sent through CoverMyMeds for [employer]. PA Case ID: [id number]. KEY:  Monica Ballard

## 2019-11-17 NOTE — Progress Notes (Addendum)
BH MD/PA/NP OP Progress Note  11/17/2019 1:34 PM Monica Ballard  MRN:  [id number] Interview was conducted using teleconferencing application and I verified that I was speaking with the correct person using two identifiers. I discussed the limitations of evaluation and management by telemedicine and  the availability of in person appointments. Patient expressed understanding and agreed to proceed.  Chief Complaint: Depressed mood, middle insomnia.  HPI: 44 yo married white female initially referred by her PCP for treatment of chronic depression and anxiety resistant to treatment. Monica Ballard reports being depressed and anxious (excessive worrying, unable to relax) for close to 20 years. It started in 2003 with loss of mother and grandmother within few days of each other. She has been tried by her PCP on multiple antidepressants (fluoxetine, sertraline, paroxetine, trazodone, citalopram, bupropion, venlafaxine, duloxetine) as well as buspirone for anxiety. She is now on Lexapro 20 mg and Wellbutrin XL 300 mg for past 3 months or so. None of these trials was successful as her depression and anxiety have not remitted. She also has been prescribed clonazepam for many years - now on 1 mg tod. She has never been suicidal, never psychiatrically hospitalized and has never been under care of a psychiatrist. She has no hx of mania, psychosis, alcohol or drug abuse. She reports recent decline of appetite but attributes it to taking Wellbutrin. She has no energy, poor sleep, low self esteem, anhedonia, poor concentration. All these symptoms are chronic. We have done GeneSight test but it did not reveal any contraindications/constraits in use of most antidepressants.  While she does not endorse having any history of clear hypomanic episodes these therapeutic failures combined with family hx of BPAD may indicate presence of bipolar depression and may call for a different therapeutic approach. Ambien was not well tolerated for  insomnia.  Visit Diagnosis:    ICD-10-CM   1. 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 Major depressive disorder, recurrent episode, moderate (HCC)  F33.1   3. Generalized anxiety disorder  F41.1     Past Psychiatric History: Please see intake H&P.  Past Medical History:  Past Medical History:  Diagnosis Date  . Anemia   . Anxiety   . Asthma    smoker-uses nebulizer-not used in last year, albuterol rescue inhaler used in 11/13 preoperatively  . Chronic bronchitis (HCC)    nebulizer and rescue inhaler  . Depression   . GERD (gastroesophageal reflux disease)    occasional -tums  . Headache(784.0)   . Hearing loss in left ear   . Hypercholesteremia   . Hyperlipidemia   . Missed ab 01/09/2012   10weeks-used cytotec  . Numbness and tingling   . PONV (postoperative nausea and vomiting)   . Preterm labor   . Teeth decayed    lower , nothing loose-encouraged pt to seek dental care if necessary    Past Surgical History:  Procedure Laterality Date  . CHOLECYSTECTOMY, LAPAROSCOPIC  12/06/2010  . DILATION AND CURETTAGE OF UTERUS  06/11/2012   Procedure: DILATATION AND CURETTAGE;  Surgeon: Geryl Rankins, MD;  Location: [facility];  Service: Gynecology;  Laterality: N/A;  sec procedure start  1241  . DILATION AND EVACUATION  01/16/2012   Procedure: DILATATION AND EVACUATION;  Surgeon: Geryl Rankins, MD;  Location: [facility];  Service: Gynecology;  Laterality: N/A;  . LAPAROSCOPY  06/11/2012   Procedure: LAPAROSCOPY OPERATIVE;  Surgeon: Geryl Rankins, MD;  Location: [facility];  Service: Gynecology;  Laterality: N/A;  . left ear drum rebuilt  x2  . TYMPANOPLASTY Left 04/23/2016   Procedure: TYMPANOPLASTY LEFT EAR;  Surgeon: Serena Colonel, MD;  Location: [facility];  Service: ENT;  Laterality: Left;  Marland Kitchen VAGINAL HYSTERECTOMY N/A 09/01/2012   Procedure: HYSTERECTOMY VAGINAL;  Surgeon: Geryl Rankins, MD;  Location: [facility];  Service: Gynecology;  Laterality: N/A;    Family Psychiatric  History: Reviewed.  Family History:  Family History  Problem Relation Age of Onset  . Cancer Other        family hx  . Diabetes Other        family hx  . Depression Other        family hx   . Coronary artery disease Mother        also MVD  . Hyperlipidemia Mother   . Hypertension Mother   . Hypertension Father   . Diabetes Father   . Diabetes Maternal Grandmother   . Colon cancer Paternal Grandmother   . Breast cancer Maternal Aunt   . Anesthesia problems Neg Hx     Social History:  Social History   Socioeconomic History  . Marital status: Married    Spouse name: Not on file  . Number of children: 2  . Years of education: GED  . Highest education level: Not on file  Occupational History  . Occupation: Housewife  Tobacco Use  . Smoking status: Former Smoker    Packs/day: 0.25    Years: 8.00    Pack years: 2.00    Types: Cigarettes  . Smokeless tobacco: Never Used  . Tobacco comment: up to 1/2 ppl x 8 years  Substance and Sexual Activity  . Alcohol use: No  . Drug use: No  . Sexual activity: Yes    Birth control/protection: Surgical  Other Topics Concern  . Not on file  Social History Narrative   Lives with daughter, son and husband in a one story home.  Has 2 children.  Does not work at this time.  Education: GED.    Right-handed.   4-5 twelve ounce cans per day.   Social Determinants of Health   Financial Resource Strain:   . Difficulty of Paying Living Expenses:   Food Insecurity:   . Worried About Programme researcher, broadcasting/film/video in the Last Year:   . Barista in the Last Year:   Transportation Needs:   . Freight forwarder (Medical):   Marland Kitchen Lack of Transportation (Non-Medical):   Physical Activity:   . Days of Exercise per Week:   . Minutes of Exercise per Session:   Stress:   . Feeling of Stress :   Social Connections:   . Frequency of Communication with Friends and Family:   . Frequency of Social Gatherings with Friends and Family:   . Attends  Religious Services:   . Active Member of Clubs or Organizations:   . Attends Banker Meetings:   Marland Kitchen Marital Status:     Allergies:  Allergies  Allergen Reactions  . Hydrocodone-Acetaminophen Hives and Swelling    Hives  . Penicillins Hives    Metabolic Disorder Labs: No results found for: HGBA1C, MPG No results found for: PROLACTIN No results found for: CHOL, TRIG, HDL, CHOLHDL, VLDL, LDLCALC No results found for: TSH  Therapeutic Level Labs: No results found for: LITHIUM No results found for: VALPROATE No components found for:  CBMZ  Current Medications: Current Outpatient Medications  Medication Sig Dispense Refill  . buPROPion (WELLBUTRIN XL) 300 MG 24 hr tablet  Take 1 tablet (300 mg total) by mouth daily. 30 tablet 2  . ciprofloxacin-dexamethasone (CIPRODEX) otic suspension Place 3 drops into the left ear 3 (three) times daily. 7.5 mL 2  . clonazePAM (KLONOPIN) 1 MG tablet Take 1 tablet (1 mg total) by mouth 3 (three) times daily. Can take up to three times daily, but usually only takes twice a day 90 tablet 1  . diclofenac sodium (VOLTAREN) 1 % GEL Apply 4 g topically 4 (four) times daily as needed. 100 g 11  . dicyclomine (BENTYL) 10 MG capsule Take 10 mg by mouth 4 (four) times daily -  before meals and at bedtime.    Marland Kitchen escitalopram (LEXAPRO) 20 MG tablet Take 1 tablet (20 mg total) by mouth at bedtime. 30 tablet 1  . eszopiclone (LUNESTA) 2 MG TABS tablet Take 1 tablet (2 mg total) by mouth at bedtime as needed for sleep. Take immediately before bedtime 30 tablet 1  . ibuprofen (ADVIL,MOTRIN) 800 MG tablet Take 1 tablet (800 mg total) by mouth every 8 (eight) hours as needed for pain. For pain 30 tablet 2  . lurasidone (LATUDA) 20 MG TABS tablet Take 1 tablet (20 mg total) by mouth daily with supper. 30 tablet 1  . meloxicam (MOBIC) 15 MG tablet Take 1 tablet (15 mg total) by mouth daily. One tab after meal 30 tablet 11  . Multiple Vitamin (MULTIVITAMIN WITH  MINERALS) TABS Take 1 tablet by mouth daily.    . pantoprazole (PROTONIX) 40 MG tablet Take 1 tablet (40 mg total) by mouth daily. 30 tablet 0  . polyethylene glycol (MIRALAX / GLYCOLAX) packet Take 17 g by mouth daily.    . promethazine (PHENERGAN) 12.5 MG tablet Take 12.5 mg by mouth every 8 (eight) hours as needed for nausea or vomiting.    . promethazine (PHENERGAN) 25 MG suppository Place 1 suppository (25 mg total) rectally every 6 (six) hours as needed for nausea or vomiting. 12 suppository 1  . sucralfate (CARAFATE) 1 g tablet Take 1 tablet (1 g total) by mouth 4 (four) times daily -  with meals and at bedtime. 30 tablet 0   No current facility-administered medications for this visit.     Psychiatric Specialty Exam: Review of Systems  Constitutional: Positive for fatigue.  Psychiatric/Behavioral: Positive for sleep disturbance. The patient is nervous/anxious.   All other systems reviewed and are negative.   Last menstrual period 08/25/2012.There is no height or weight on file to calculate BMI.  General Appearance: Casual and Fairly Groomed  Eye Contact:  Good  Speech:  Clear and Coherent and Normal Rate  Volume:  Normal  Mood:  Anxious and Depressed  Affect:  Full Range  Thought Process:  Goal Directed and Linear  Orientation:  Full (Time, Place, and Person)  Thought Content: Logical   Suicidal Thoughts:  No  Homicidal Thoughts:  No  Memory:  Immediate;   Good Recent;   Good Remote;   Good  Judgement:  Good  Insight:  Fair  Psychomotor Activity:  Normal  Concentration:  Concentration: Good  Recall:  Good  Fund of Knowledge: Good  Language: Good  Akathisia:  Negative  Handed:  Right  AIMS (if indicated): not done  Assets:  Communication Skills Desire for Improvement Financial Resources/Insurance Housing Resilience Social Support  ADL's:  Intact  Cognition: WNL  Sleep:  Fair   Assessment and Plan: 44 yo married white female initially referred by her PCP for  treatment of chronic depression and  anxiety resistant to treatment. Raveen reports being depressed and anxious (excessive worrying, unable to relax) for close to 20 years. It started in 2003 with loss of mother and grandmother within few days of each other. She has been tried by her PCP on multiple antidepressants (fluoxetine, sertraline, paroxetine, trazodone, citalopram, bupropion, venlafaxine, duloxetine) as well as buspirone for anxiety. She is now on Lexapro 20 mg and Wellbutrin XL 300 mg for past 3 months or so. None of these trials was successful as her depression and anxiety have not remitted. She also has been prescribed clonazepam for many years - now on 1 mg tod. She has never been suicidal, never psychiatrically hospitalized and has never been under care of a psychiatrist. She has no hx of mania, psychosis, alcohol or drug abuse. She reports recent decline of appetite but attributes it to taking Wellbutrin. She has no energy, poor sleep, low self esteem, anhedonia, poor concentration. All these symptoms are chronic. We have done GeneSight test but it did not reveal any contraindications/constraits in use of most antidepressants.  While she does not endorse having any history of clear hypomanic episodes these therapeutic failures combined with family hx of BPAD may indicate presence of bipolar depression and may call for a different therapeutic approach. Ambien was not well tolerated for insomnia.  Dx: Dysthymic disorder/MDD (suspicion of bipolar depression), GAD  Plan: We will try a combination of lurasidone (20 mg to start but if mood dose not start to improve in 2 weeks she will go up to 40 mg) and bupropion. Escitalopram will be decreased to 10 mg x 10 days then dc. I will try eszopiclone 2 mg for insomnia and continue clonazepam unchanged. Next appointment in 7 weeks. She has missed appointment with Ottis Stain. The plan was discussed with patient who had an opportunity to ask questions  and these were all answered. I spend 20 minutes in videoconferencing with the patient    Magdalene Patricia, MD 11/17/2019, 1:34 PM

## 2019-11-25 NOTE — Telephone Encounter (Signed)
Received notification through CoverMyMeds that [place] prior authorization was denied. PA Case: [id number].

## 2019-11-25 NOTE — Telephone Encounter (Signed)
Thank you  for update

## 2019-12-31 ENCOUNTER — Other Ambulatory Visit (HOSPITAL_COMMUNITY): Payer: Self-pay | Admitting: Psychiatry

## 2020-01-12 ENCOUNTER — Other Ambulatory Visit: Payer: Self-pay

## 2020-01-12 ENCOUNTER — Telehealth (INDEPENDENT_AMBULATORY_CARE_PROVIDER_SITE_OTHER): Payer: Commercial Managed Care - PPO | Admitting: Psychiatry

## 2020-01-12 DIAGNOSIS — F411 Generalized anxiety disorder: Secondary | ICD-10-CM

## 2020-01-12 DIAGNOSIS — F331 Major depressive disorder, recurrent, moderate: Secondary | ICD-10-CM | POA: Diagnosis not present

## 2020-01-12 DIAGNOSIS — F341 Dysthymic disorder: Secondary | ICD-10-CM | POA: Diagnosis not present

## 2020-01-12 MED ORDER — CLONAZEPAM 1 MG PO TABS: 1.0000 mg | ORAL_TABLET | Freq: Three times a day (TID) | ORAL | 1 refills | Status: DC

## 2020-01-12 MED ORDER — ARIPIPRAZOLE 2 MG PO TABS: 2.0000 mg | ORAL_TABLET | Freq: Every day | ORAL | 0 refills | Status: DC

## 2020-01-12 NOTE — Progress Notes (Signed)
BH MD/PA/NP OP Progress Note  01/12/2020 11:59 AM ESTORIA GEARY  MRN:  [id number] Interview was conducted using videoconferencing application and I verified that I was speaking with the correct person using two identifiers. I discussed the limitations of evaluation and management by telemedicine and  the availability of in person appointments. Patient expressed understanding and agreed to proceed. Patient location - home; physician - home office.  Chief Complaint: Depressed, anxious, "moody", nausea/dizziness.  HPI: 44 yo married white female initially referred by her PCP for treatment of chronic depression and anxiety resistant to treatment. Calleen reports being depressed and anxious (excessive worrying, unable to relax) for close to 20 years. It started in 2003 with loss of mother and grandmother within few days of each other. She has been tried by her PCP on multiple antidepressants (fluoxetine, sertraline, paroxetine, trazodone, citalopram, bupropion, venlafaxine, duloxetine) as well as buspirone for anxiety. She is now on Lexapro 20 mg and Wellbutrin XL 300 mg for past 3 months or so. None of these trials was successful as her depression and anxiety have not remitted. She also has been prescribed clonazepam for many years - now on 1 mg tod. She has never been suicidal, never psychiatrically hospitalized and has never been under care of a psychiatrist. She has no hx of mania, psychosis, alcohol or drug abuse. She reports recent decline of appetite but attributes it to taking Wellbutrin. She has no energy, poor sleep, low self esteem, anhedonia, poor concentration. All these symptoms are chronic. We have done GeneSight test but it did not reveal any contraindications/constraits in use of most antidepressants.  While she does not endorse having any history of clear hypomanic episodes these therapeutic failures combined with family hx of BPAD may suggest presence of bipolar depression and may call for a  different therapeutic approach. WE tried to add lurasidone (tapered escitalopram off) but it was not approved by insurance. She was then started on aripiprazole 5 mg and has been on it for 3 weeks now. No improvement in mood and she reports having nausea and some dizziness since starting it.  Visit Diagnosis:    ICD-10-CM   1. Major depressive disorder, recurrent episode, moderate (HCC)  F33.1   2. 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 Generalized anxiety disorder  F41.1     Past Psychiatric History: Please see intake H&P.  Past Medical History:  Past Medical History:  Diagnosis Date  . Anemia   . Anxiety   . Asthma    smoker-uses nebulizer-not used in last year, albuterol rescue inhaler used in 11/13 preoperatively  . Chronic bronchitis (HCC)    nebulizer and rescue inhaler  . Depression   . GERD (gastroesophageal reflux disease)    occasional -tums  . Headache(784.0)   . Hearing loss in left ear   . Hypercholesteremia   . Hyperlipidemia   . Missed ab 01/09/2012   10weeks-used cytotec  . Numbness and tingling   . PONV (postoperative nausea and vomiting)   . Preterm labor   . Teeth decayed    lower , nothing loose-encouraged pt to seek dental care if necessary    Past Surgical History:  Procedure Laterality Date  . CHOLECYSTECTOMY, LAPAROSCOPIC  12/06/2010  . DILATION AND CURETTAGE OF UTERUS  06/11/2012   Procedure: DILATATION AND CURETTAGE;  Surgeon: Geryl Rankins, MD;  Location: [facility];  Service: Gynecology;  Laterality: N/A;  sec procedure start  1241  . DILATION AND EVACUATION  01/16/2012   Procedure: DILATATION AND EVACUATION;  Surgeon: Thurnell Lose, MD;  Location: [facility];  Service: Gynecology;  Laterality: N/A;  . LAPAROSCOPY  06/11/2012   Procedure: LAPAROSCOPY OPERATIVE;  Surgeon: Thurnell Lose, MD;  Location: [facility];  Service: Gynecology;  Laterality: N/A;  . left ear drum rebuilt     x2  . TYMPANOPLASTY Left 04/23/2016   Procedure: TYMPANOPLASTY LEFT EAR;  Surgeon:  Izora Gala, MD;  Location: [facility];  Service: ENT;  Laterality: Left;  Marland Kitchen VAGINAL HYSTERECTOMY N/A 09/01/2012   Procedure: HYSTERECTOMY VAGINAL;  Surgeon: Thurnell Lose, MD;  Location: [facility];  Service: Gynecology;  Laterality: N/A;    Family Psychiatric History: Reviewed.  Family History:  Family History  Problem Relation Age of Onset  . Cancer Other        family hx  . Diabetes Other        family hx  . Depression Other        family hx   . Coronary artery disease Mother        also MVD  . Hyperlipidemia Mother   . Hypertension Mother   . Hypertension Father   . Diabetes Father   . Diabetes Maternal Grandmother   . Colon cancer Paternal Grandmother   . Breast cancer Maternal Aunt   . Anesthesia problems Neg Hx     Social History:  Social History   Socioeconomic History  . Marital status: Married    Spouse name: Not on file  . Number of children: 2  . Years of education: GED  . Highest education level: Not on file  Occupational History  . Occupation: Housewife  Tobacco Use  . Smoking status: Former Smoker    Packs/day: 0.25    Years: 8.00    Pack years: 2.00    Types: Cigarettes  . Smokeless tobacco: Never Used  . Tobacco comment: up to 1/2 ppl x 8 years  Substance and Sexual Activity  . Alcohol use: No  . Drug use: No  . Sexual activity: Yes    Birth control/protection: Surgical  Other Topics Concern  . Not on file  Social History Narrative   Lives with daughter, son and husband in a one story home.  Has 2 children.  Does not work at this time.  Education: GED.    Right-handed.   4-5 twelve ounce cans per day.   Social Determinants of Health   Financial Resource Strain:   . Difficulty of Paying Living Expenses:   Food Insecurity:   . Worried About Charity fundraiser in the Last Year:   . Arboriculturist in the Last Year:   Transportation Needs:   . Film/video editor (Medical):   Marland Kitchen Lack of Transportation (Non-Medical):    Physical Activity:   . Days of Exercise per Week:   . Minutes of Exercise per Session:   Stress:   . Feeling of Stress :   Social Connections:   . Frequency of Communication with Friends and Family:   . Frequency of Social Gatherings with Friends and Family:   . Attends Religious Services:   . Active Member of Clubs or Organizations:   . Attends Archivist Meetings:   Marland Kitchen Marital Status:     Allergies:  Allergies  Allergen Reactions  . Hydrocodone-Acetaminophen Hives and Swelling    Hives  . Penicillins Hives    Metabolic Disorder Labs: No results found for: HGBA1C, MPG No results found for: PROLACTIN No results found for: CHOL, TRIG, HDL,  CHOLHDL, VLDL, LDLCALC No results found for: TSH  Therapeutic Level Labs: No results found for: LITHIUM No results found for: VALPROATE No components found for:  CBMZ  Current Medications: Current Outpatient Medications  Medication Sig Dispense Refill  . [START ON 01/16/2020] ARIPiprazole (ABILIFY) 2 MG tablet Take 1 tablet (2 mg total) by mouth daily. 30 tablet 0  . buPROPion (WELLBUTRIN XL) 300 MG 24 hr tablet TAKE 1 TABLET BY MOUTH ONCE DAILY. 30 tablet 0  . ciprofloxacin-dexamethasone (CIPRODEX) otic suspension Place 3 drops into the left ear 3 (three) times daily. 7.5 mL 2  . [START ON 01/16/2020] clonazePAM (KLONOPIN) 1 MG tablet Take 1 tablet (1 mg total) by mouth 3 (three) times daily. Can take up to three times daily, but usually only takes twice a day 90 tablet 1  . diclofenac sodium (VOLTAREN) 1 % GEL Apply 4 g topically 4 (four) times daily as needed. 100 g 11  . dicyclomine (BENTYL) 10 MG capsule Take 10 mg by mouth 4 (four) times daily -  before meals and at bedtime.    . eszopiclone (LUNESTA) 2 MG TABS tablet Take 1 tablet (2 mg total) by mouth at bedtime as needed for sleep. Take immediately before bedtime 30 tablet 1  . ibuprofen (ADVIL,MOTRIN) 800 MG tablet Take 1 tablet (800 mg total) by mouth every 8 (eight)  hours as needed for pain. For pain 30 tablet 2  . meloxicam (MOBIC) 15 MG tablet Take 1 tablet (15 mg total) by mouth daily. One tab after meal 30 tablet 11  . Multiple Vitamin (MULTIVITAMIN WITH MINERALS) TABS Take 1 tablet by mouth daily.    . pantoprazole (PROTONIX) 40 MG tablet Take 1 tablet (40 mg total) by mouth daily. 30 tablet 0  . polyethylene glycol (MIRALAX / GLYCOLAX) packet Take 17 g by mouth daily.    . promethazine (PHENERGAN) 12.5 MG tablet Take 12.5 mg by mouth every 8 (eight) hours as needed for nausea or vomiting.    . promethazine (PHENERGAN) 25 MG suppository Place 1 suppository (25 mg total) rectally every 6 (six) hours as needed for nausea or vomiting. 12 suppository 1  . sucralfate (CARAFATE) 1 g tablet Take 1 tablet (1 g total) by mouth 4 (four) times daily -  with meals and at bedtime. 30 tablet 0   No current facility-administered medications for this visit.     Psychiatric Specialty Exam: Review of Systems  Gastrointestinal: Positive for nausea.  Neurological: Positive for dizziness.  Psychiatric/Behavioral: Positive for dysphoric mood. The patient is nervous/anxious.     Last menstrual period 08/25/2012.There is no height or weight on file to calculate BMI.  General Appearance: Casual and Fairly Groomed  Eye Contact:  Good  Speech:  Clear and Coherent and Normal Rate  Volume:  Normal  Mood:  Anxious, Depressed and Irritable  Affect:  Full Range  Thought Process:  Goal Directed  Orientation:  Full (Time, Place, and Person)  Thought Content: Logical   Suicidal Thoughts:  No  Homicidal Thoughts:  No  Memory:  Immediate;   Fair Recent;   Fair Remote;   Good  Judgement:  Good  Insight:  Good  Psychomotor Activity:  Normal  Concentration:  Concentration: Fair  Recall:  Fair  Fund of Knowledge: Good  Language: Good  Akathisia:  Negative  Handed:  Right  AIMS (if indicated): not done  Assets:  Communication Skills Desire for Improvement Financial  Resources/Insurance Housing Social Support  ADL's:  Intact  Cognition:  WNL  Sleep:  Good    Assessment and Plan: 44 yo married white female initially referred by her PCP for treatment of chronic depression and anxiety resistant to treatment. Meriem reports being depressed and anxious (excessive worrying, unable to relax) for close to 20 years. It started in 2003 with loss of mother and grandmother within few days of each other. She has been tried by her PCP on multiple antidepressants (fluoxetine, sertraline, paroxetine, trazodone, citalopram, bupropion, venlafaxine, duloxetine) as well as buspirone for anxiety. She is now on Lexapro 20 mg and Wellbutrin XL 300 mg for past 3 months or so. None of these trials was successful as her depression and anxiety have not remitted. She also has been prescribed clonazepam for many years - now on 1 mg tod. She has never been suicidal, never psychiatrically hospitalized and has never been under care of a psychiatrist. She has no hx of mania, psychosis, alcohol or drug abuse. She reports recent decline of appetite but attributes it to taking Wellbutrin. She has no energy, poor sleep, low self esteem, anhedonia, poor concentration. All these symptoms are chronic. We have done GeneSight test but it did not reveal any contraindications/constraits in use of most antidepressants.  While she does not endorse having any history of clear hypomanic episodes these therapeutic failures combined with family hx of BPAD may suggest presence of bipolar depression and may call for a different therapeutic approach. WE tried to add lurasidone (tapered escitalopram off) but it was not approved by insurance. She was then started on aripiprazole 5 mg and has been on it for 3 weeks now. No improvement in mood and she reports having nausea and some dizziness since starting it.  Dx: Dysthymic disorder/MDD (r/o bipolar depression), GAD  Plan: We will decrease aripiprazole dose to 2 mg at HS,  continue bupropion XL 300 mg and clonazepam prn anxiety. I think she is a good candidate for a trial of TMS and will make appropriate referral. Next appointment with me in one month - insurance told her that if she fails  60 day trial of aripiprazole they will consider approving alternatives. The plan was discussed with patient who had an opportunity to ask questions and these were all answered. I spend79minutes in videoconferencingwith the patient.    Magdalene Patricia, MD 01/12/2020, 11:59 AM

## 2020-02-01 ENCOUNTER — Telehealth (HOSPITAL_COMMUNITY): Payer: Self-pay

## 2020-02-02 ENCOUNTER — Telehealth (HOSPITAL_COMMUNITY): Payer: Self-pay

## 2020-02-02 NOTE — Telephone Encounter (Signed)
Pt is scheduled for a TMS consult on Thursday 02/04/20 at 1:00.

## 2020-02-02 NOTE — Telephone Encounter (Signed)
Waiting for pt to call back to see if she is still interested in TMS.

## 2020-02-04 ENCOUNTER — Telehealth (HOSPITAL_COMMUNITY): Payer: Commercial Managed Care - PPO | Admitting: Psychiatry

## 2020-02-11 ENCOUNTER — Other Ambulatory Visit: Payer: Self-pay

## 2020-02-11 ENCOUNTER — Telehealth (INDEPENDENT_AMBULATORY_CARE_PROVIDER_SITE_OTHER): Payer: Commercial Managed Care - PPO | Admitting: Psychiatry

## 2020-02-11 DIAGNOSIS — F341 Dysthymic disorder: Secondary | ICD-10-CM | POA: Diagnosis not present

## 2020-02-11 DIAGNOSIS — F331 Major depressive disorder, recurrent, moderate: Secondary | ICD-10-CM | POA: Diagnosis not present

## 2020-02-11 DIAGNOSIS — F411 Generalized anxiety disorder: Secondary | ICD-10-CM

## 2020-02-11 MED ORDER — BUPROPION HCL ER (XL) 300 MG PO TB24: 300.0000 mg | ORAL_TABLET | Freq: Every day | ORAL | 1 refills | Status: DC

## 2020-02-11 MED ORDER — ZOLPIDEM TARTRATE 5 MG PO TABS: 5.0000 mg | ORAL_TABLET | Freq: Every evening | ORAL | 0 refills | Status: DC | PRN

## 2020-02-11 NOTE — Progress Notes (Signed)
BH MD/PA/NP OP Progress Note  02/11/2020 11:54 AM Monica Ballard  MRN:  [id number] Interview was conducted using videoconferencing application and I verified that I was speaking with the correct person using two identifiers. I discussed the limitations of evaluation and management by telemedicine and  the availability of in person appointments. Patient expressed understanding and agreed to proceed. Patient location - home; physician - home office.  Chief Complaint: Depressed and anxious.  HPI: 44 yo married white female initially referred by her PCP for treatment of chronic depression and anxiety resistant to treatment. Monica Ballard reports being depressed and anxious (excessive worrying, unable to relax) for close to 20 years. It started in 2003 with loss of mother and grandmother within few days of each other. She has been tried by her PCP on multiple antidepressants (fluoxetine, sertraline, paroxetine, trazodone, citalopram, bupropion, venlafaxine, duloxetine) as well as buspirone for anxiety. She is now on Lexapro 20 mg and Wellbutrin XL 300 mg for past 3 months or so. None of these trials was successful as her depression and anxiety have not remitted. She also has been prescribed clonazepam for many years - now on 1 mg tod. She has never been suicidal, never psychiatrically hospitalized and has never been under care of a psychiatrist. She has no hx of mania, psychosis, alcohol or drug abuse. She reports recent decline of appetite but attributes it to taking Wellbutrin. She has no energy, poor sleep, low self esteem, anhedonia, poor concentration. All these symptoms are chronic.We have done GeneSight test but it did not reveal any contraindications/constraits in use of most antidepressants.While she does not endorse having any history ofclear hypomanic episodes these therapeutic failures combined with family hx of BPAD may suggest presence of bipolar depression and may call for a different therapeutic  approach. We tried to add lurasidone (tapered escitalopram off) but it was not approved by insurance. She was then started on aripiprazole 5 mg and has been on it for 3 weeks but it had to be decreased to 2 mg as she was feeling dizzy on it. No improvement in mood after 2 month. I referred her for TMS but her copayment would have been too high for her to afford it. She will lose insurance (husband is changing jobs) next month. now.   Visit Diagnosis:    ICD-10-CM   1. 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 Generalized anxiety disorder  F41.1   3. Major depressive disorder, recurrent episode, moderate (HCC)  F33.1     Past Psychiatric History: Please see intake H&P>  Past Medical History:  Past Medical History:  Diagnosis Date  . Anemia   . Anxiety   . Asthma    smoker-uses nebulizer-not used in last year, albuterol rescue inhaler used in 11/13 preoperatively  . Chronic bronchitis (HCC)    nebulizer and rescue inhaler  . Depression   . GERD (gastroesophageal reflux disease)    occasional -tums  . Headache(784.0)   . Hearing loss in left ear   . Hypercholesteremia   . Hyperlipidemia   . Missed ab 01/09/2012   10weeks-used cytotec  . Numbness and tingling   . PONV (postoperative nausea and vomiting)   . Preterm labor   . Teeth decayed    lower , nothing loose-encouraged pt to seek dental care if necessary    Past Surgical History:  Procedure Laterality Date  . CHOLECYSTECTOMY, LAPAROSCOPIC  12/06/2010  . DILATION AND CURETTAGE OF UTERUS  06/11/2012   Procedure: DILATATION AND CURETTAGE;  Surgeon:  Geryl Rankins, MD;  Location: [facility];  Service: Gynecology;  Laterality: N/A;  sec procedure start  1241  . DILATION AND EVACUATION  01/16/2012   Procedure: DILATATION AND EVACUATION;  Surgeon: Geryl Rankins, MD;  Location: [facility];  Service: Gynecology;  Laterality: N/A;  . LAPAROSCOPY  06/11/2012   Procedure: LAPAROSCOPY OPERATIVE;  Surgeon: Geryl Rankins, MD;  Location: [facility];  Service:  Gynecology;  Laterality: N/A;  . left ear drum rebuilt     x2  . TYMPANOPLASTY Left 04/23/2016   Procedure: TYMPANOPLASTY LEFT EAR;  Surgeon: Serena Colonel, MD;  Location: [facility];  Service: ENT;  Laterality: Left;  Marland Kitchen VAGINAL HYSTERECTOMY N/A 09/01/2012   Procedure: HYSTERECTOMY VAGINAL;  Surgeon: Geryl Rankins, MD;  Location: [facility];  Service: Gynecology;  Laterality: N/A;    Family Psychiatric History: Reviewed.  Family History:  Family History  Problem Relation Age of Onset  . Cancer Other        family hx  . Diabetes Other        family hx  . Depression Other        family hx   . Coronary artery disease Mother        also MVD  . Hyperlipidemia Mother   . Hypertension Mother   . Hypertension Father   . Diabetes Father   . Diabetes Maternal Grandmother   . Colon cancer Paternal Grandmother   . Breast cancer Maternal Aunt   . Anesthesia problems Neg Hx     Social History:  Social History   Socioeconomic History  . Marital status: Married    Spouse name: Not on file  . Number of children: 2  . Years of education: GED  . Highest education level: Not on file  Occupational History  . Occupation: Housewife  Tobacco Use  . Smoking status: Former Smoker    Packs/day: 0.25    Years: 8.00    Pack years: 2.00    Types: Cigarettes  . Smokeless tobacco: Never Used  . Tobacco comment: up to 1/2 ppl x 8 years  Substance and Sexual Activity  . Alcohol use: No  . Drug use: No  . Sexual activity: Yes    Birth control/protection: Surgical  Other Topics Concern  . Not on file  Social History Narrative   Lives with daughter, son and husband in a one story home.  Has 2 children.  Does not work at this time.  Education: GED.    Right-handed.   4-5 twelve ounce cans per day.   Social Determinants of Health   Financial Resource Strain:   . Difficulty of Paying Living Expenses:   Food Insecurity:   . Worried About Programme researcher, broadcasting/film/video in the Last Year:   .  Barista in the Last Year:   Transportation Needs:   . Freight forwarder (Medical):   Marland Kitchen Lack of Transportation (Non-Medical):   Physical Activity:   . Days of Exercise per Week:   . Minutes of Exercise per Session:   Stress:   . Feeling of Stress :   Social Connections:   . Frequency of Communication with Friends and Family:   . Frequency of Social Gatherings with Friends and Family:   . Attends Religious Services:   . Active Member of Clubs or Organizations:   . Attends Banker Meetings:   Marland Kitchen Marital Status:     Allergies:  Allergies  Allergen Reactions  . Hydrocodone-Acetaminophen Hives and  Swelling    Hives  . Penicillins Hives    Metabolic Disorder Labs: No results found for: HGBA1C, MPG No results found for: PROLACTIN No results found for: CHOL, TRIG, HDL, CHOLHDL, VLDL, LDLCALC No results found for: TSH  Therapeutic Level Labs: No results found for: LITHIUM No results found for: VALPROATE No components found for:  CBMZ  Current Medications: Current Outpatient Medications  Medication Sig Dispense Refill  . buPROPion (WELLBUTRIN XL) 300 MG 24 hr tablet Take 1 tablet (300 mg total) by mouth daily. 90 tablet 1  . ciprofloxacin-dexamethasone (CIPRODEX) otic suspension Place 3 drops into the left ear 3 (three) times daily. 7.5 mL 2  . clonazePAM (KLONOPIN) 1 MG tablet Take 1 tablet (1 mg total) by mouth 3 (three) times daily. Can take up to three times daily, but usually only takes twice a day 90 tablet 1  . diclofenac sodium (VOLTAREN) 1 % GEL Apply 4 g topically 4 (four) times daily as needed. 100 g 11  . dicyclomine (BENTYL) 10 MG capsule Take 10 mg by mouth 4 (four) times daily -  before meals and at bedtime.    Marland Kitchen ibuprofen (ADVIL,MOTRIN) 800 MG tablet Take 1 tablet (800 mg total) by mouth every 8 (eight) hours as needed for pain. For pain 30 tablet 2  . meloxicam (MOBIC) 15 MG tablet Take 1 tablet (15 mg total) by mouth daily. One tab after  meal 30 tablet 11  . Multiple Vitamin (MULTIVITAMIN WITH MINERALS) TABS Take 1 tablet by mouth daily.    . pantoprazole (PROTONIX) 40 MG tablet Take 1 tablet (40 mg total) by mouth daily. 30 tablet 0  . polyethylene glycol (MIRALAX / GLYCOLAX) packet Take 17 g by mouth daily.    . promethazine (PHENERGAN) 12.5 MG tablet Take 12.5 mg by mouth every 8 (eight) hours as needed for nausea or vomiting.    . promethazine (PHENERGAN) 25 MG suppository Place 1 suppository (25 mg total) rectally every 6 (six) hours as needed for nausea or vomiting. 12 suppository 1  . sucralfate (CARAFATE) 1 g tablet Take 1 tablet (1 g total) by mouth 4 (four) times daily -  with meals and at bedtime. 30 tablet 0  . zolpidem (AMBIEN) 5 MG tablet Take 1 tablet (5 mg total) by mouth at bedtime as needed for sleep. 30 tablet 0   No current facility-administered medications for this visit.    Psychiatric Specialty Exam: Review of Systems  Constitutional: Positive for fatigue.  Psychiatric/Behavioral: Positive for decreased concentration and sleep disturbance. The patient is nervous/anxious.   All other systems reviewed and are negative.   Last menstrual period 08/25/2012.There is no height or weight on file to calculate BMI.  General Appearance: Casual and Fairly Groomed  Eye Contact:  Good  Speech:  Clear and Coherent  Volume:  Normal  Mood:  Anxious and Depressed  Affect:  Non-Congruent and Full Range  Thought Process:  Goal Directed  Orientation:  Full (Time, Place, and Person)  Thought Content: Logical   Suicidal Thoughts:  No  Homicidal Thoughts:  No  Memory:  Immediate;   Good Recent;   Good Remote;   Good  Judgement:  Good  Insight:  Good  Psychomotor Activity:  Normal  Concentration:  Concentration: Fair  Recall:  Good  Fund of Knowledge: Good  Language: Good  Akathisia:  Negative  Handed:  Right  AIMS (if indicated): not done  Assets:  Communication Skills Desire for  Improvement Housing Resilience Social Support  ADL's:  Intact  Cognition: WNL  Sleep:  Fair    Assessment and Plan: 44 yo married white female initially referred by her PCP for treatment of chronic depression and anxiety resistant to treatment. [name] reports being depressed and anxious (excessive worrying, unable to relax) for close to 20 years. It started in 2003 with loss of mother and grandmother within few days of each other. She has been tried by her PCP on multiple antidepressants (fluoxetine, sertraline, paroxetine, trazodone, citalopram, bupropion, venlafaxine, duloxetine) as well as buspirone for anxiety. She is now on Lexapro 20 mg and Wellbutrin XL 300 mg for past 3 months or so. None of these trials was successful as her depression and anxiety have not remitted. She also has been prescribed clonazepam for many years - now on 1 mg tod. She has never been suicidal, never psychiatrically hospitalized and has never been under care of a psychiatrist. She has no hx of mania, psychosis, alcohol or drug abuse. She reports recent decline of appetite but attributes it to taking Wellbutrin. She has no energy, poor sleep, low self esteem, anhedonia, poor concentration. All these symptoms are chronic.We have done GeneSight test but it did not reveal any contraindications/constraits in use of most antidepressants.While she does not endorse having any history ofclear hypomanic episodes these therapeutic failures combined with family hx of BPAD may suggest presence of bipolar depression and may call for a different therapeutic approach. We tried to add lurasidone (tapered escitalopram off) but it was not approved by insurance. She was then started on aripiprazole 5 mg and has been on it for 3 weeks but it had to be decreased to 2 mg as she was feeling dizzy on it. No improvement in mood after 2 month. I referred her for TMS but her copayment would have been too high for her to afford it. She will lose  insurance (husband is changing jobs) next month. now.   Dx: Dysthymic disorder/MDD(r/o bipolar depression), GAD  Plan:We will discontinue aripiprazole, continue bupropion XL 300 mg and clonazepam prn anxiety (also zolpidem 5 mg prn insomnia). I will try Latuda 20 mg samples, if tolerated well but not effective we can increae dose to 40 mg. If she rsponds to it we will apply for patient assistance program. Next appointment with me in one month. The plan was discussed with patient who had an opportunity to ask questions and these were all answered. I spend2625minutes in videoconferencingwith the patient.    Magdalene Patricialgierd A Pucilowski, MD 02/11/2020, 11:54 AM

## 2020-02-15 ENCOUNTER — Telehealth (HOSPITAL_COMMUNITY): Payer: Self-pay | Admitting: *Deleted

## 2020-02-15 NOTE — Telephone Encounter (Signed)
Writer left VM for pt regarding samples of Latuda 20mg  being available for pt. Office hours and phone number provided. Pt encouraged to call with any questions or concerns.

## 2020-02-15 NOTE — Telephone Encounter (Signed)
Thank you Michelle.  

## 2020-02-23 ENCOUNTER — Ambulatory Visit: Payer: Self-pay | Admitting: Family Medicine

## 2020-03-11 ENCOUNTER — Other Ambulatory Visit: Payer: Self-pay

## 2020-03-11 ENCOUNTER — Other Ambulatory Visit (HOSPITAL_COMMUNITY): Payer: Self-pay | Admitting: Psychiatry

## 2020-03-11 ENCOUNTER — Telehealth (INDEPENDENT_AMBULATORY_CARE_PROVIDER_SITE_OTHER): Payer: Commercial Managed Care - PPO | Admitting: Psychiatry

## 2020-03-11 DIAGNOSIS — F331 Major depressive disorder, recurrent, moderate: Secondary | ICD-10-CM

## 2020-03-11 DIAGNOSIS — F341 Dysthymic disorder: Secondary | ICD-10-CM

## 2020-03-11 DIAGNOSIS — F411 Generalized anxiety disorder: Secondary | ICD-10-CM

## 2020-03-11 MED ORDER — PROMETHAZINE HCL 25 MG PO TABS: 25.0000 mg | ORAL_TABLET | Freq: Three times a day (TID) | ORAL | 1 refills | Status: DC | PRN

## 2020-03-11 MED ORDER — CLONAZEPAM 1 MG PO TABS: 1.0000 mg | ORAL_TABLET | Freq: Three times a day (TID) | ORAL | 1 refills | Status: DC

## 2020-03-11 NOTE — Progress Notes (Signed)
BH MD/PA/NP OP Progress Note  03/11/2020 11:23 AM Monica Ballard  MRN:  [id number] Interview was conducted using videoconferencing application and I verified that I was speaking with the correct person using two identifiers. I discussed the limitations of evaluation and management by telemedicine and  the availability of in person appointments. Patient expressed understanding and agreed to proceed. Patient location - home; physician - home office.  Chief Complaint: Depression, nausea.  HPI: 44yo married white female initially referred by her PCP for treatment of chronic depression and anxiety resistant to treatment. Reba reports being depressed and anxious (excessive worrying, unable to relax) for close to 20 years. It started in 2003 with loss of mother and grandmother within few days of each other. She has been tried by her PCP on multiple antidepressants (fluoxetine, sertraline, paroxetine, trazodone, citalopram, bupropion, venlafaxine, duloxetine) as well as buspirone for anxiety. She is now on Lexapro 20 mg and Wellbutrin XL 300 mg for past 3 months or so. None of these trials was successful as her depression and anxiety have not remitted. She also has been prescribed clonazepam for many years - now on 1 mg tod. She has never been suicidal, never psychiatrically hospitalized and has never been under care of a psychiatrist. She has no hx of mania, psychosis, alcohol or drug abuse. She reports recent decline of appetite but attributes it to taking Wellbutrin. She has no energy, poor sleep, low self esteem, anhedonia, poor concentration. All these symptoms are chronic.We have done GeneSight test but it did not reveal any contraindications/constraits in use of most antidepressants.While she does not endorse having any history ofclear hypomanic episodes (although she admits to feeling irritable, angry when depressed) these therapeutic failures combined with family hx of BPAD maysuggestpresence of  bipolar depression and may call for a different therapeutic approach. We are trying lurasidone 20 mg (tapered escitalopram off) and perhaps she noticed some improvement in mood. She was earlier tried on aripiprazole 5 mg but it had to be decreased to 2 mg as she was feeling dizzy on it. No improvement in mood followed after 2 month trial. I referred her for TMS but her copayment would have been too high for her to afford it. She lost insurance (husband is changing jobs) but will have it in October.    Visit Diagnosis:    ICD-10-CM   1. 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 Generalized anxiety disorder  F41.1   3. Major depressive disorder, recurrent episode, moderate (HCC)  F33.1     Past Psychiatric History: Please see intake H&P.  Past Medical History:  Past Medical History:  Diagnosis Date  . Anemia   . Anxiety   . Asthma    smoker-uses nebulizer-not used in last year, albuterol rescue inhaler used in 11/13 preoperatively  . Chronic bronchitis (HCC)    nebulizer and rescue inhaler  . Depression   . GERD (gastroesophageal reflux disease)    occasional -tums  . Headache(784.0)   . Hearing loss in left ear   . Hypercholesteremia   . Hyperlipidemia   . Missed ab 01/09/2012   10weeks-used cytotec  . Numbness and tingling   . PONV (postoperative nausea and vomiting)   . Preterm labor   . Teeth decayed    lower , nothing loose-encouraged pt to seek dental care if necessary    Past Surgical History:  Procedure Laterality Date  . CHOLECYSTECTOMY, LAPAROSCOPIC  12/06/2010  . DILATION AND CURETTAGE OF UTERUS  06/11/2012   Procedure: DILATATION  AND CURETTAGE;  Surgeon: Geryl Rankins, MD;  Location: [facility];  Service: Gynecology;  Laterality: N/A;  sec procedure start  1241  . DILATION AND EVACUATION  01/16/2012   Procedure: DILATATION AND EVACUATION;  Surgeon: Geryl Rankins, MD;  Location: [facility];  Service: Gynecology;  Laterality: N/A;  . LAPAROSCOPY  06/11/2012   Procedure: LAPAROSCOPY  OPERATIVE;  Surgeon: Geryl Rankins, MD;  Location: [facility];  Service: Gynecology;  Laterality: N/A;  . left ear drum rebuilt     x2  . TYMPANOPLASTY Left 04/23/2016   Procedure: TYMPANOPLASTY LEFT EAR;  Surgeon: Serena Colonel, MD;  Location: [facility];  Service: ENT;  Laterality: Left;  Marland Kitchen VAGINAL HYSTERECTOMY N/A 09/01/2012   Procedure: HYSTERECTOMY VAGINAL;  Surgeon: Geryl Rankins, MD;  Location: [facility];  Service: Gynecology;  Laterality: N/A;    Family Psychiatric History: Reviewed.  Family History:  Family History  Problem Relation Age of Onset  . Cancer Other        family hx  . Diabetes Other        family hx  . Depression Other        family hx   . Coronary artery disease Mother        also MVD  . Hyperlipidemia Mother   . Hypertension Mother   . Hypertension Father   . Diabetes Father   . Diabetes Maternal Grandmother   . Colon cancer Paternal Grandmother   . Breast cancer Maternal Aunt   . Anesthesia problems Neg Hx     Social History:  Social History   Socioeconomic History  . Marital status: Married    Spouse name: Not on file  . Number of children: 2  . Years of education: GED  . Highest education level: Not on file  Occupational History  . Occupation: Housewife  Tobacco Use  . Smoking status: Former Smoker    Packs/day: 0.25    Years: 8.00    Pack years: 2.00    Types: Cigarettes  . Smokeless tobacco: Never Used  . Tobacco comment: up to 1/2 ppl x 8 years  Substance and Sexual Activity  . Alcohol use: No  . Drug use: No  . Sexual activity: Yes    Birth control/protection: Surgical  Other Topics Concern  . Not on file  Social History Narrative   Lives with daughter, son and husband in a one story home.  Has 2 children.  Does not work at this time.  Education: GED.    Right-handed.   4-5 twelve ounce cans per day.   Social Determinants of Health   Financial Resource Strain:   . Difficulty of Paying Living Expenses: Not on file   Food Insecurity:   . Worried About Programme researcher, broadcasting/film/video in the Last Year: Not on file  . Ran Out of Food in the Last Year: Not on file  Transportation Needs:   . Lack of Transportation (Medical): Not on file  . Lack of Transportation (Non-Medical): Not on file  Physical Activity:   . Days of Exercise per Week: Not on file  . Minutes of Exercise per Session: Not on file  Stress:   . Feeling of Stress : Not on file  Social Connections:   . Frequency of Communication with Friends and Family: Not on file  . Frequency of Social Gatherings with Friends and Family: Not on file  . Attends Religious Services: Not on file  . Active Member of Clubs or Organizations: Not on  file  . Attends BankerClub or Organization Meetings: Not on file  . Marital Status: Not on file    Allergies:  Allergies  Allergen Reactions  . Hydrocodone-Acetaminophen Hives and Swelling    Hives  . Penicillins Hives    Metabolic Disorder Labs: No results found for: HGBA1C, MPG No results found for: PROLACTIN No results found for: CHOL, TRIG, HDL, CHOLHDL, VLDL, LDLCALC No results found for: TSH  Therapeutic Level Labs: No results found for: LITHIUM No results found for: VALPROATE No components found for:  CBMZ  Current Medications: Current Outpatient Medications  Medication Sig Dispense Refill  . buPROPion (WELLBUTRIN XL) 300 MG 24 hr tablet Take 1 tablet (300 mg total) by mouth daily. 90 tablet 1  . ciprofloxacin-dexamethasone (CIPRODEX) otic suspension Place 3 drops into the left ear 3 (three) times daily. 7.5 mL 2  . clonazePAM (KLONOPIN) 1 MG tablet Take 1 tablet (1 mg total) by mouth 3 (three) times daily. Can take up to three times daily, but usually only takes twice a day 90 tablet 1  . diclofenac sodium (VOLTAREN) 1 % GEL Apply 4 g topically 4 (four) times daily as needed. 100 g 11  . dicyclomine (BENTYL) 10 MG capsule Take 10 mg by mouth 4 (four) times daily -  before meals and at bedtime.    Marland Kitchen. ibuprofen  (ADVIL,MOTRIN) 800 MG tablet Take 1 tablet (800 mg total) by mouth every 8 (eight) hours as needed for pain. For pain 30 tablet 2  . meloxicam (MOBIC) 15 MG tablet Take 1 tablet (15 mg total) by mouth daily. One tab after meal 30 tablet 11  . Multiple Vitamin (MULTIVITAMIN WITH MINERALS) TABS Take 1 tablet by mouth daily.    . pantoprazole (PROTONIX) 40 MG tablet Take 1 tablet (40 mg total) by mouth daily. 30 tablet 0  . polyethylene glycol (MIRALAX / GLYCOLAX) packet Take 17 g by mouth daily.    . promethazine (PHENERGAN) 25 MG tablet Take 1 tablet (25 mg total) by mouth every 8 (eight) hours as needed for nausea or vomiting. 90 tablet 1  . sucralfate (CARAFATE) 1 g tablet Take 1 tablet (1 g total) by mouth 4 (four) times daily -  with meals and at bedtime. 30 tablet 0   No current facility-administered medications for this visit.     Psychiatric Specialty Exam: Review of Systems  Gastrointestinal: Positive for nausea.  Psychiatric/Behavioral: Positive for dysphoric mood. The patient is nervous/anxious.   All other systems reviewed and are negative.   Last menstrual period 08/25/2012.There is no height or weight on file to calculate BMI.  General Appearance: Casual and Fairly Groomed  Eye Contact:  Good  Speech:  Clear and Coherent and Normal Rate  Volume:  Normal  Mood:  Anxious, Depressed and Irritable  Affect:  Full Range  Thought Process:  Goal Directed  Orientation:  Full (Time, Place, and Person)  Thought Content: Logical   Suicidal Thoughts:  No  Homicidal Thoughts:  No  Memory:  Immediate;   Good Recent;   Good Remote;   Good  Judgement:  Good  Insight:  Fair  Psychomotor Activity:  Normal  Concentration:  Concentration: Fair  Recall:  Good  Fund of Knowledge: Good  Language: Good  Akathisia:  Negative  Handed:  Right  AIMS (if indicated): not done  Assets:  Communication Skills Desire for Improvement Housing Resilience Social Support  ADL's:  Intact   Cognition: WNL  Sleep:  Fair    Assessment  and Plan: 44yo married white female initially referred by her PCP for treatment of chronic depression and anxiety resistant to treatment. Jalan reports being depressed and anxious (excessive worrying, unable to relax) for close to 20 years. It started in 2003 with loss of mother and grandmother within few days of each other. She has been tried by her PCP on multiple antidepressants (fluoxetine, sertraline, paroxetine, trazodone, citalopram, bupropion, venlafaxine, duloxetine) as well as buspirone for anxiety. She is now on Lexapro 20 mg and Wellbutrin XL 300 mg for past 3 months or so. None of these trials was successful as her depression and anxiety have not remitted. She also has been prescribed clonazepam for many years - now on 1 mg tod. She has never been suicidal, never psychiatrically hospitalized and has never been under care of a psychiatrist. She has no hx of mania, psychosis, alcohol or drug abuse. She reports recent decline of appetite but attributes it to taking Wellbutrin. She has no energy, poor sleep, low self esteem, anhedonia, poor concentration. All these symptoms are chronic.We have done GeneSight test but it did not reveal any contraindications/constraits in use of most antidepressants.While she does not endorse having any history ofclear hypomanic episodes (although she admits to feeling irritable, angry when depressed) these therapeutic failures combined with family hx of BPAD maysuggestpresence of bipolar depression and may call for a different therapeutic approach. We are trying lurasidone 20 mg (tapered escitalopram off) and perhaps she noticed some improvement in mood. She was earlier tried on aripiprazole 5 mg but it had to be decreased to 2 mg as she was feeling dizzy on it. No improvement in mood followed after 2 month trial. I referred her for TMS but her copayment would have been too high for her to afford it. She lost insurance  (husband is changing jobs) but will have it in October.   Dx: Dysthymic disorder/MDD(r/obipolar depression), GAD  Plan:We willcontinue bupropion XL 300 mg and clonazepam prn anxiety and increase Latuda to 40 mg samples. If she rsponds to it we will apply for patient assistance program. Next appointment with me in one month. The plan was discussed with patient who had an opportunity to ask questions and these were all answered. I spend83minutes in videoconferencingwith the patient.    Magdalene Patricia, MD 03/11/2020, 11:23 AM

## 2020-04-01 ENCOUNTER — Other Ambulatory Visit (HOSPITAL_COMMUNITY): Payer: Self-pay | Admitting: Psychiatry

## 2020-04-08 ENCOUNTER — Other Ambulatory Visit: Payer: Self-pay

## 2020-04-08 ENCOUNTER — Telehealth (INDEPENDENT_AMBULATORY_CARE_PROVIDER_SITE_OTHER): Payer: Self-pay | Admitting: Psychiatry

## 2020-04-08 DIAGNOSIS — F341 Dysthymic disorder: Secondary | ICD-10-CM

## 2020-04-08 DIAGNOSIS — F411 Generalized anxiety disorder: Secondary | ICD-10-CM

## 2020-04-08 DIAGNOSIS — F330 Major depressive disorder, recurrent, mild: Secondary | ICD-10-CM

## 2020-04-08 MED ORDER — CLONAZEPAM 1 MG PO TABS: 1.0000 mg | ORAL_TABLET | Freq: Three times a day (TID) | ORAL | 1 refills | Status: DC

## 2020-04-08 NOTE — Progress Notes (Signed)
BH MD/PA/NP OP Progress Note  04/08/2020 10:24 AM Monica Ballard  MRN:  [id number] Interview was conducted using videoconferencing application and I verified that I was speaking with the correct person using two identifiers. I discussed the limitations of evaluation and management by telemedicine and  the availability of in person appointments. Patient expressed understanding and agreed to proceed. Patient location - home; physician - home office.  Chief Complaint: "I am beginning to feel better".  HPI: 44yo married white female initially referred by her PCP for treatment of chronic depression and anxiety resistant to treatment. Monica Ballard reports being depressed and anxious (excessive worrying, unable to relax) for close to 20 years. It started in 2003 with loss of mother and grandmother within few days of each other. She has been tried by her PCP on multiple antidepressants (fluoxetine, sertraline, paroxetine, trazodone, citalopram, bupropion, venlafaxine, duloxetine) as well as buspirone for anxiety. She is now on Lexapro 20 mg and Wellbutrin XL 300 mg for past 3 months or so. None of these trials was successful as her depression and anxiety have not remitted. She also has been prescribed clonazepam for many years - now on 1 mg tod. She has never been suicidal, never psychiatrically hospitalized and has never been under care of a psychiatrist. She has no hx of mania, psychosis, alcohol or drug abuse. She reports recent decline of appetite but attributes it to taking Wellbutrin. She has no energy, poor sleep, low self esteem, anhedonia, poor concentration. All these symptoms are chronic.We have done GeneSight test but it did not reveal any contraindications/constraits in use of most antidepressants.While she does not endorse having any history ofclear hypomanic episodes (although she admits to feeling irritable, angry when depressed) these therapeutic failures combined with family hx of BPAD  maysuggestpresence of bipolar depression and may call for a different therapeutic approach. We are trying lurasidone 20 mg (tapered escitalopram off) and perhaps she noticed some improvement in mood. We then increased it further to 40 mg - well tolerated and further improvement in mood: less depressed, less anxious, no irritability. She was earlier tried on aripiprazole 5 mg but it had to be decreased to 2 mg as she was feeling dizzy on it. No improvement in mood followed after 2 month trial. I referred her for TMS but her copayment would have been too high for her to afford it.    Visit Diagnosis:    ICD-10-CM   1. Generalized anxiety disorder  F41.1   2. 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  Past Medical History:  Past Medical History:  Diagnosis Date  . Anemia   . Anxiety   . Asthma    smoker-uses nebulizer-not used in last year, albuterol rescue inhaler used in 11/13 preoperatively  . Chronic bronchitis (HCC)    nebulizer and rescue inhaler  . Depression   . GERD (gastroesophageal reflux disease)    occasional -tums  . Headache(784.0)   . Hearing loss in left ear   . Hypercholesteremia   . Hyperlipidemia   . Missed ab 01/09/2012   10weeks-used cytotec  . Numbness and tingling   . PONV (postoperative nausea and vomiting)   . Preterm labor   . Teeth decayed    lower , nothing loose-encouraged pt to seek dental care if necessary    Past Surgical History:  Procedure Laterality Date  . CHOLECYSTECTOMY,  LAPAROSCOPIC  12/06/2010  . DILATION AND CURETTAGE OF UTERUS  06/11/2012   Procedure: DILATATION AND CURETTAGE;  Surgeon: Geryl Rankins, MD;  Location: [facility];  Service: Gynecology;  Laterality: N/A;  sec procedure start  1241  . DILATION AND EVACUATION  01/16/2012   Procedure: DILATATION AND EVACUATION;  Surgeon: Geryl Rankins, MD;  Location:  [facility];  Service: Gynecology;  Laterality: N/A;  . LAPAROSCOPY  06/11/2012   Procedure: LAPAROSCOPY OPERATIVE;  Surgeon: Geryl Rankins, MD;  Location: [facility];  Service: Gynecology;  Laterality: N/A;  . left ear drum rebuilt     x2  . TYMPANOPLASTY Left 04/23/2016   Procedure: TYMPANOPLASTY LEFT EAR;  Surgeon: Serena Colonel, MD;  Location: [facility];  Service: ENT;  Laterality: Left;  Marland Kitchen VAGINAL HYSTERECTOMY N/A 09/01/2012   Procedure: HYSTERECTOMY VAGINAL;  Surgeon: Geryl Rankins, MD;  Location: [facility];  Service: Gynecology;  Laterality: N/A;    Family Psychiatric History: Reviewed.  Family History:  Family History  Problem Relation Age of Onset  . Cancer Other        family hx  . Diabetes Other        family hx  . Depression Other        family hx   . Coronary artery disease Mother        also MVD  . Hyperlipidemia Mother   . Hypertension Mother   . Hypertension Father   . Diabetes Father   . Diabetes Maternal Grandmother   . Colon cancer Paternal Grandmother   . Breast cancer Maternal Aunt   . Anesthesia problems Neg Hx     Social History:  Social History   Socioeconomic History  . Marital status: Married    Spouse name: Not on file  . Number of children: 2  . Years of education: GED  . Highest education level: Not on file  Occupational History  . Occupation: Housewife  Tobacco Use  . Smoking status: Former Smoker    Packs/day: 0.25    Years: 8.00    Pack years: 2.00    Types: Cigarettes  . Smokeless tobacco: Never Used  . Tobacco comment: up to 1/2 ppl x 8 years  Substance and Sexual Activity  . Alcohol use: No  . Drug use: No  . Sexual activity: Yes    Birth control/protection: Surgical  Other Topics Concern  . Not on file  Social History Narrative   Lives with daughter, son and husband in a one story home.  Has 2 children.  Does not work at this time.  Education: GED.    Right-handed.   4-5 twelve ounce cans per day.   Social  Determinants of Health   Financial Resource Strain:   . Difficulty of Paying Living Expenses: Not on file  Food Insecurity:   . Worried About Programme researcher, broadcasting/film/video in the Last Year: Not on file  . Ran Out of Food in the Last Year: Not on file  Transportation Needs:   . Lack of Transportation (Medical): Not on file  . Lack of Transportation (Non-Medical): Not on file  Physical Activity:   . Days of Exercise per Week: Not on file  . Minutes of Exercise per Session: Not on file  Stress:   . Feeling of Stress : Not on file  Social Connections:   . Frequency of Communication with Friends and Family: Not on file  . Frequency of Social Gatherings with Friends and Family: Not on file  .  Attends Religious Services: Not on file  . Active Member of Clubs or Organizations: Not on file  . Attends BankerClub or Organization Meetings: Not on file  . Marital Status: Not on file    Allergies:  Allergies  Allergen Reactions  . Hydrocodone-Acetaminophen Hives and Swelling    Hives  . Penicillins Hives    Metabolic Disorder Labs: No results found for: HGBA1C, MPG No results found for: PROLACTIN No results found for: CHOL, TRIG, HDL, CHOLHDL, VLDL, LDLCALC No results found for: TSH  Therapeutic Level Labs: No results found for: LITHIUM No results found for: VALPROATE No components found for:  CBMZ  Current Medications: Current Outpatient Medications  Medication Sig Dispense Refill  . buPROPion (WELLBUTRIN XL) 300 MG 24 hr tablet Take 1 tablet (300 mg total) by mouth daily. 90 tablet 1  . ciprofloxacin-dexamethasone (CIPRODEX) otic suspension Place 3 drops into the left ear 3 (three) times daily. 7.5 mL 2  . [START ON 05/16/2020] clonazePAM (KLONOPIN) 1 MG tablet Take 1 tablet (1 mg total) by mouth 3 (three) times daily. Can take up to three times daily, but usually only takes twice a day 90 tablet 1  . diclofenac sodium (VOLTAREN) 1 % GEL Apply 4 g topically 4 (four) times daily as needed. 100 g  11  . dicyclomine (BENTYL) 10 MG capsule Take 10 mg by mouth 4 (four) times daily -  before meals and at bedtime.    Marland Kitchen. ibuprofen (ADVIL,MOTRIN) 800 MG tablet Take 1 tablet (800 mg total) by mouth every 8 (eight) hours as needed for pain. For pain 30 tablet 2  . meloxicam (MOBIC) 15 MG tablet Take 1 tablet (15 mg total) by mouth daily. One tab after meal 30 tablet 11  . Multiple Vitamin (MULTIVITAMIN WITH MINERALS) TABS Take 1 tablet by mouth daily.    . pantoprazole (PROTONIX) 40 MG tablet Take 1 tablet (40 mg total) by mouth daily. 30 tablet 0  . polyethylene glycol (MIRALAX / GLYCOLAX) packet Take 17 g by mouth daily.    . promethazine (PHENERGAN) 25 MG tablet Take 1 tablet (25 mg total) by mouth every 8 (eight) hours as needed for nausea or vomiting. 90 tablet 1  . sucralfate (CARAFATE) 1 g tablet Take 1 tablet (1 g total) by mouth 4 (four) times daily -  with meals and at bedtime. 30 tablet 0   No current facility-administered medications for this visit.     Psychiatric Specialty Exam: Review of Systems  Gastrointestinal: Positive for nausea.  Psychiatric/Behavioral: The patient is nervous/anxious.   All other systems reviewed and are negative.   Last menstrual period 08/25/2012.There is no height or weight on file to calculate BMI.  General Appearance: Casual and Fairly Groomed  Eye Contact:  Good  Speech:  Clear and Coherent  Volume:  Normal  Mood:  Less depressed but anxious.  Affect:  Full Range  Thought Process:  Goal Directed and Linear  Orientation:  Full (Time, Place, and Person)  Thought Content: Logical   Suicidal Thoughts:  No  Homicidal Thoughts:  No  Memory:  Immediate;   Good Recent;   Good Remote;   Good  Judgement:  Good  Insight:  Fair  Psychomotor Activity:  Normal  Concentration:  Concentration: Good  Recall:  Good  Fund of Knowledge: Good  Language: Good  Akathisia:  Negative  Handed:  Right  AIMS (if indicated): not done  Assets:  Communication  Skills Desire for Improvement Housing Physical Health Social Support  ADL's:  Intact  Cognition: WNL  Sleep:  Good     Assessment and Plan: 44yo married white female initially referred by her PCP for treatment of chronic depression and anxiety resistant to treatment. Arnelle reports being depressed and anxious (excessive worrying, unable to relax) for close to 20 years. It started in 2003 with loss of mother and grandmother within few days of each other. She has been tried by her PCP on multiple antidepressants (fluoxetine, sertraline, paroxetine, trazodone, citalopram, bupropion, venlafaxine, duloxetine) as well as buspirone for anxiety. She is now on Lexapro 20 mg and Wellbutrin XL 300 mg for past 3 months or so. None of these trials was successful as her depression and anxiety have not remitted. She also has been prescribed clonazepam for many years - now on 1 mg tod. She has never been suicidal, never psychiatrically hospitalized and has never been under care of a psychiatrist. She has no hx of mania, psychosis, alcohol or drug abuse. She reports recent decline of appetite but attributes it to taking Wellbutrin. She has no energy, poor sleep, low self esteem, anhedonia, poor concentration. All these symptoms are chronic.We have done GeneSight test but it did not reveal any contraindications/constraits in use of most antidepressants.While she does not endorse having any history ofclear hypomanic episodes (although she admits to feeling irritable, angry when depressed) these therapeutic failures combined with family hx of BPAD maysuggestpresence of bipolar depression and may call for a different therapeutic approach. We are trying lurasidone 20 mg (tapered escitalopram off) and perhaps she noticed some improvement in mood. We then increased it further to 40 mg - well tolerated and further improvement in mood: less depressed, less anxious, no irritability. She was earlier tried on aripiprazole 5 mg  but it had to be decreased to 2 mg as she was feeling dizzy on it. No improvement in mood followed after 2 month trial. I referred her for TMS but her copayment would have been too high for her to afford it.   Dx: Dysthymic disorder/MDDmixed (r/obipolar depression), GAD  Plan:We willcontinue bupropion XL 300 mg, clonazepam prn anxietyand Latuda to 40 mg samples. Phenergan prn nausea. We should apply for patient assistance program.Next appointment with me in two months.The plan was discussed with patient who had an opportunity to ask questions and these were all answered. I spend15minutes in videoconferencingwith the patient.  Visit Diagnosis:    Magdalene Patricia, MD 04/08/2020, 10:24 AM

## 2020-04-15 ENCOUNTER — Other Ambulatory Visit (HOSPITAL_COMMUNITY): Payer: Self-pay | Admitting: Psychiatry

## 2020-04-27 ENCOUNTER — Other Ambulatory Visit (HOSPITAL_COMMUNITY): Payer: Self-pay

## 2020-04-27 MED ORDER — LATUDA 20 MG PO TABS: 20.0000 mg | ORAL_TABLET | Freq: Every day | ORAL | 0 refills | Status: DC

## 2020-04-27 MED ORDER — LURASIDONE HCL 40 MG PO TABS: 40.0000 mg | ORAL_TABLET | Freq: Every day | ORAL | 0 refills | Status: DC

## 2020-04-27 NOTE — Progress Notes (Unsigned)
la 

## 2020-05-19 ENCOUNTER — Other Ambulatory Visit: Payer: Self-pay

## 2020-05-19 ENCOUNTER — Telehealth (INDEPENDENT_AMBULATORY_CARE_PROVIDER_SITE_OTHER): Payer: BC Managed Care – PPO | Admitting: Psychiatry

## 2020-05-19 DIAGNOSIS — F330 Major depressive disorder, recurrent, mild: Secondary | ICD-10-CM | POA: Diagnosis not present

## 2020-05-19 DIAGNOSIS — F411 Generalized anxiety disorder: Secondary | ICD-10-CM | POA: Diagnosis not present

## 2020-05-19 MED ORDER — DIAZEPAM 5 MG PO TABS
5.0000 mg | ORAL_TABLET | Freq: Three times a day (TID) | ORAL | 2 refills | Status: DC | PRN
Start: 2020-05-19 — End: 2020-07-21

## 2020-05-19 MED ORDER — PROMETHAZINE HCL 25 MG PO TABS: 25.0000 mg | ORAL_TABLET | Freq: Three times a day (TID) | ORAL | 1 refills | Status: DC | PRN

## 2020-05-19 MED ORDER — LURASIDONE HCL 40 MG PO TABS
40.0000 mg | ORAL_TABLET | Freq: Every day | ORAL | 2 refills | Status: DC
Start: 2020-05-19 — End: 2020-07-21

## 2020-05-19 NOTE — Progress Notes (Signed)
BH MD/PA/NP OP Progress Note  05/19/2020 11:55 AM Monica Ballard  MRN:  [id number] Interview was conducted using videoconferencing application but only voice connection was established and I verified that I was speaking with the correct person using two identifiers. I discussed the limitations of evaluation and management by telemedicine and  the availability of in person appointments. Patient expressed understanding and agreed to proceed. Patient location - home; physician - home office.  Chief Complaint: More anxiety.  HPI: 44yo married white female initially referred by her PCP for treatment of chronic depression and anxiety resistant to treatment. Monica Ballard reports being depressed and anxious (excessive worrying, unable to relax) for close to 20 years. It started in 2003 with loss of mother and grandmother within few days of each other. She has been tried by her PCP on multiple antidepressants (fluoxetine, sertraline, paroxetine, trazodone, citalopram, bupropion, venlafaxine, duloxetine) as well as buspirone for anxiety. She is now on Lexapro 20 mg and Wellbutrin XL 300 mg for past 3 months or so. None of these trials was successful as her depression and anxiety have not remitted. She also has been prescribed clonazepam for many years - now on 1 mg tod. She has never been suicidal, never psychiatrically hospitalized and has never been under care of a psychiatrist. She has no hx of mania, psychosis, alcohol or drug abuse. She reports recent decline of appetite but attributes it to taking Wellbutrin. She has no energy, poor sleep, low self esteem, anhedonia, poor concentration. All these symptoms are chronic.We have done GeneSight test but it did not reveal any contraindications/constraits in use of most antidepressants.While she does not endorse having clear hypomanic episodes(although she admits to feeling irritable, angry when depressed)these therapeutic failures combined with family hx of BPAD  maysuggestpresence of bipolar depression and may call for a different therapeutic approach. Weare tryinglurasidone20 mg(tapered escitalopram off)and perhaps she noticed some improvement in mood.We then increased it further to 40 mg - well tolerated and further improvement in mood: less depressed, less anxious, no irritability. She wasearlier tried onaripiprazole 5 mg but it had to be decreased to 2 mg as she was feeling dizzy on it. No improvement in moodfollowedafter 2 monthtrial. I referred her for TMS but her copayment would have been too high for her to afford it. Monica Ballard lost her sister last month and has been more withdrawn, anxious, tearful.    Visit Diagnosis:    ICD-10-CM   1. 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 Generalized anxiety disorder  F41.1     Past Psychiatric History: Please see intake H&P.  Past Medical History:  Past Medical History:  Diagnosis Date  . Anemia   . Anxiety   . Asthma    smoker-uses nebulizer-not used in last year, albuterol rescue inhaler used in 11/13 preoperatively  . Chronic bronchitis (HCC)    nebulizer and rescue inhaler  . Depression   . GERD (gastroesophageal reflux disease)    occasional -tums  . Headache(784.0)   . Hearing loss in left ear   . Hypercholesteremia   . Hyperlipidemia   . Missed ab 01/09/2012   10weeks-used cytotec  . Numbness and tingling   . PONV (postoperative nausea and vomiting)   . Preterm labor   . Teeth decayed    lower , nothing loose-encouraged pt to seek dental care if necessary    Past Surgical History:  Procedure Laterality Date  . CHOLECYSTECTOMY, LAPAROSCOPIC  12/06/2010  . DILATION AND  CURETTAGE OF UTERUS  06/11/2012   Procedure: DILATATION AND CURETTAGE;  Surgeon: Geryl Rankins, MD;  Location: [facility];  Service: Gynecology;  Laterality: N/A;  sec procedure start  1241  . DILATION AND EVACUATION  01/16/2012   Procedure: DILATATION AND  EVACUATION;  Surgeon: Geryl Rankins, MD;  Location: [facility];  Service: Gynecology;  Laterality: N/A;  . LAPAROSCOPY  06/11/2012   Procedure: LAPAROSCOPY OPERATIVE;  Surgeon: Geryl Rankins, MD;  Location: [facility];  Service: Gynecology;  Laterality: N/A;  . left ear drum rebuilt     x2  . TYMPANOPLASTY Left 04/23/2016   Procedure: TYMPANOPLASTY LEFT EAR;  Surgeon: Serena Colonel, MD;  Location:  [facility];  Service: ENT;  Laterality: Left;  Marland Kitchen VAGINAL HYSTERECTOMY N/A 09/01/2012   Procedure: HYSTERECTOMY VAGINAL;  Surgeon: Geryl Rankins, MD;  Location: [facility];  Service: Gynecology;  Laterality: N/A;    Family Psychiatric History: Reviewed.  Family History:  Family History  Problem Relation Age of Onset  . Cancer Other        family hx  . Diabetes Other        family hx  . Depression Other        family hx   . Coronary artery disease Mother        also MVD  . Hyperlipidemia Mother   . Hypertension Mother   . Hypertension Father   . Diabetes Father   . Diabetes Maternal Grandmother   . Colon cancer Paternal Grandmother   . Breast cancer Maternal Aunt   . Anesthesia problems Neg Hx     Social History:  Social History   Socioeconomic History  . Marital status: Married    Spouse name: Not on file  . Number of children: 2  . Years of education: GED  . Highest education level: Not on file  Occupational History  . Occupation: Housewife  Tobacco Use  . Smoking status: Former Smoker    Packs/day: 0.25    Years: 8.00    Pack years: 2.00    Types: Cigarettes  . Smokeless tobacco: Never Used  . Tobacco comment: up to 1/2 ppl x 8 years  Substance and Sexual Activity  . Alcohol use: No  . Drug use: No  . Sexual activity: Yes    Birth control/protection: Surgical  Other Topics Concern  . Not on file  Social History Narrative   Lives with daughter, son and husband in a one story home.  Has 2 children.  Does not work at this time.  Education: GED.    Right-handed.    4-5 twelve ounce cans per day.   Social Determinants of Health   Financial Resource Strain:   . Difficulty of Paying Living Expenses: Not on file  Food Insecurity:   . Worried About Programme researcher, broadcasting/film/video in the Last Year: Not on file  . Ran Out of Food in the Last Year: Not on file  Transportation Needs:   . Lack of Transportation (Medical): Not on file  . Lack of Transportation (Non-Medical): Not on file  Physical Activity:   . Days of Exercise per Week: Not on file  . Minutes of Exercise per Session: Not on file  Stress:   . Feeling of Stress : Not on file  Social Connections:   . Frequency of Communication with Friends and Family: Not on file  . Frequency of Social Gatherings with Friends and Family: Not on file  . Attends Religious Services: Not on file  .  Active Member of Clubs or Organizations: Not on file  . Attends Banker Meetings: Not on file  . Marital Status: Not on file    Allergies:  Allergies  Allergen Reactions  . Hydrocodone-Acetaminophen Hives and Swelling    Hives  . Penicillins Hives    Metabolic Disorder Labs: No results found for: HGBA1C, MPG No results found for: PROLACTIN No results found for: CHOL, TRIG, HDL, CHOLHDL, VLDL, LDLCALC No results found for: TSH  Therapeutic Level Labs: No results found for: LITHIUM No results found for: VALPROATE No components found for:  CBMZ  Current Medications: Current Outpatient Medications  Medication Sig Dispense Refill  . buPROPion (WELLBUTRIN XL) 300 MG 24 hr tablet Take 1 tablet (300 mg total) by mouth daily. 90 tablet 1  . ciprofloxacin-dexamethasone (CIPRODEX) otic suspension Place 3 drops into the left ear 3 (three) times daily. 7.5 mL 2  . diazepam (VALIUM) 5 MG tablet Take 1 tablet (5 mg total) by mouth every 8 (eight) hours as needed for anxiety. 90 tablet 2  . diclofenac sodium (VOLTAREN) 1 % GEL Apply 4 g topically 4 (four) times daily as needed. 100 g 11  . dicyclomine (BENTYL)  10 MG capsule Take 10 mg by mouth 4 (four) times daily -  before meals and at bedtime.    Marland Kitchen ibuprofen (ADVIL,MOTRIN) 800 MG tablet Take 1 tablet (800 mg total) by mouth every 8 (eight) hours as needed for pain. For pain 30 tablet 2  . lurasidone (LATUDA) 40 MG TABS tablet Take 1 tablet (40 mg total) by mouth daily with supper. 30 tablet 2  . meloxicam (MOBIC) 15 MG tablet Take 1 tablet (15 mg total) by mouth daily. One tab after meal 30 tablet 11  . Multiple Vitamin (MULTIVITAMIN WITH MINERALS) TABS Take 1 tablet by mouth daily.    . pantoprazole (PROTONIX) 40 MG tablet Take 1 tablet (40 mg total) by mouth daily. 30 tablet 0  . polyethylene glycol (MIRALAX / GLYCOLAX) packet Take 17 g by mouth daily.    . promethazine (PHENERGAN) 25 MG tablet Take 1 tablet (25 mg total) by mouth every 8 (eight) hours as needed for nausea or vomiting. 90 tablet 1  . sucralfate (CARAFATE) 1 g tablet Take 1 tablet (1 g total) by mouth 4 (four) times daily -  with meals and at bedtime. 30 tablet 0   No current facility-administered medications for this visit.     Psychiatric Specialty Exam: Review of Systems  Psychiatric/Behavioral: The patient is nervous/anxious.   All other systems reviewed and are negative.   Last menstrual period 08/25/2012.There is no height or weight on file to calculate BMI.  General Appearance: NA  Eye Contact:  NA  Speech:  Clear and Coherent and Normal Rate  Volume:  Normal  Mood:  Anxious and Depressed  Affect:  NA  Thought Process:  Goal Directed  Orientation:  Full (Time, Place, and Person)  Thought Content: Rumination   Suicidal Thoughts:  No  Homicidal Thoughts:  No  Memory:  Immediate;   Good Recent;   Good Remote;   Good  Judgement:  Good  Insight:  Good  Psychomotor Activity:  NA  Concentration:  Concentration: Fair  Recall:  Good  Fund of Knowledge: Good  Language: Good  Akathisia:  Negative  Handed:  Right  AIMS (if indicated): not done  Assets:   Communication Skills Desire for Improvement Financial Resources/Insurance Housing Resilience Social Support  ADL's:  Intact  Cognition: WNL  Sleep:  Fair     Assessment and Plan: 44yo married white female initially referred by her PCP for treatment of chronic depression and anxiety resistant to treatment. Monica Ballard reports being depressed and anxious (excessive worrying, unable to relax) for close to 20 years. It started in 2003 with loss of mother and grandmother within few days of each other. She has been tried by her PCP on multiple antidepressants (fluoxetine, sertraline, paroxetine, trazodone, citalopram, bupropion, venlafaxine, duloxetine) as well as buspirone for anxiety. She is now on Lexapro 20 mg and Wellbutrin XL 300 mg for past 3 months or so. None of these trials was successful as her depression and anxiety have not remitted. She also has been prescribed clonazepam for many years - now on 1 mg tod. She has never been suicidal, never psychiatrically hospitalized and has never been under care of a psychiatrist. She has no hx of mania, psychosis, alcohol or drug abuse. She reports recent decline of appetite but attributes it to taking Wellbutrin. She has no energy, poor sleep, low self esteem, anhedonia, poor concentration. All these symptoms are chronic.We have done GeneSight test but it did not reveal any contraindications/constraits in use of most antidepressants.While she does not endorse having clear hypomanic episodes(although she admits to feeling irritable, angry when depressed)these therapeutic failures combined with family hx of BPAD maysuggestpresence of bipolar depression and may call for a different therapeutic approach. Weadded lurasidone20 mg(tapered escitalopram off)and perhaps she noticed some improvement in mood.We then increased it further to 40 mg - well tolerated and further improvement in mood: less depressed, no irritability. This has changed some after Monica Ballard  lost her sister last month - she has been more withdrawn, anxious, tearful.   Dx: MDDmixed (vs bipolar disorder, unspecified, depressed), GAD; Bereavement  Plan:We willcontinue bupropion XL 300 mg, change clonazepam to diazepam 5 mg tid prn anxietyand continue Latuda40 mg.Phenergan prn nausea. Patient is asking for and would benefit from resuming counseling appointments. Next appointment with me in two months.The plan was discussed with patient who had an opportunity to ask questions and these were all answered. I spend62minutes in videoconferencingwith the patient.    Magdalene Patricia, MD 05/19/2020, 11:55 AM

## 2020-05-25 ENCOUNTER — Telehealth (HOSPITAL_COMMUNITY): Payer: Self-pay | Admitting: *Deleted

## 2020-05-25 NOTE — Telephone Encounter (Signed)
None of these symptoms are likely caused by diazepam (but may reflect  high anxiety which is not adequately responding to diazepam).

## 2020-05-25 NOTE — Telephone Encounter (Signed)
Pt called with c/o feeling "nausea, vomiting, sweats, malaise and anxiety since switching from Klonopin to Valium. Nurse explained that the medications are in the same class so w/d should be minimal and also advised to see PCP regarding symptoms. Pt says she's already had Covid and she was not going to see PCP. Please review and advise. Pt has upcoming appointment on 06/20/20.

## 2020-06-07 ENCOUNTER — Telehealth (HOSPITAL_COMMUNITY): Payer: Self-pay | Admitting: Psychiatry

## 2020-06-14 ENCOUNTER — Other Ambulatory Visit (HOSPITAL_COMMUNITY): Payer: Self-pay | Admitting: Psychiatry

## 2020-06-20 ENCOUNTER — Other Ambulatory Visit: Payer: Self-pay

## 2020-06-20 ENCOUNTER — Telehealth (INDEPENDENT_AMBULATORY_CARE_PROVIDER_SITE_OTHER): Payer: BC Managed Care – PPO | Admitting: Psychiatry

## 2020-06-20 DIAGNOSIS — F411 Generalized anxiety disorder: Secondary | ICD-10-CM

## 2020-06-20 DIAGNOSIS — F330 Major depressive disorder, recurrent, mild: Secondary | ICD-10-CM | POA: Diagnosis not present

## 2020-06-20 MED ORDER — PROMETHAZINE HCL 25 MG PO TABS
ORAL_TABLET | ORAL | 0 refills | Status: DC
Start: 2020-06-20 — End: 2020-08-02

## 2020-06-20 MED ORDER — LAMOTRIGINE 25 MG PO TABS: ORAL_TABLET | ORAL | 0 refills | Status: DC

## 2020-06-20 NOTE — Progress Notes (Addendum)
BH MD/PA/NP OP Progress Note  06/20/2020 10:16 AM Monica Ballard  MRN:  [id number] Interview was conducted by phone and I verified that I was speaking with the correct person using two identifiers. I discussed the limitations of evaluation and management by telemedicine and  the availability of in person appointments. Patient expressed understanding and agreed to proceed. Participants in the visit: patient (location - home); physician (location - home office).  Chief Complaint: "Moodiness", anxiety.  HPI: 44yo married white female initially referred by her PCP for treatment of chronic depression and anxiety resistant to treatment. Monica Ballard reports being depressed and anxious (excessive worrying, unable to relax) for close to 20 years. It started in 2003 with loss of mother and grandmother within few days of each other. She has been tried by her PCP on multiple antidepressants (fluoxetine, sertraline, paroxetine, trazodone, citalopram, bupropion, venlafaxine, duloxetine) as well as buspirone for anxiety. She is now on Lexapro 20 mg and Wellbutrin XL 300 mg for past 3 months or so. None of these trials was successful as her depression and anxiety have not remitted. She also has been prescribed clonazepam for many years - now on 1 mg tod. She has never been suicidal, never psychiatrically hospitalized and has never been under care of a psychiatrist. She has no hx of mania, psychosis, alcohol or drug abuse. She reports recent decline of appetite but attributes it to taking Wellbutrin. She has no energy, poor sleep, low self esteem, anhedonia, poor concentration. All these symptoms are chronic.We have done GeneSight test but it did not reveal any contraindications/constraits in use of most antidepressants.While she does not endorse having clear hypomanic episodes(although she admits to feeling irritable, angry when depressed)these therapeutic failures combined with family hx of BPAD maysuggestpresence of  bipolar depression and may call for a different therapeutic approach. Weadded lurasidone20 mg(tapered escitalopram off)and perhaps she noticed some improvement in mood.We then increased it further to 40 mg - well tolerated and further improvement in mood: less depressed, but still irritable. Monica Ballard lost her sister in September - she has been more withdrawn, anxious. WE have changed clonazepam to diazepam which helps "some".    Visit Diagnosis:    ICD-10-CM   1. 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 Generalized anxiety disorder  F41.1     Past Psychiatric History: Please see intake H&P.  Past Medical History:  Past Medical History:  Diagnosis Date  . Anemia   . Anxiety   . Asthma    smoker-uses nebulizer-not used in last year, albuterol rescue inhaler used in 11/13 preoperatively  . Chronic bronchitis (HCC)    nebulizer and rescue inhaler  . Depression   . GERD (gastroesophageal reflux disease)    occasional -tums  . Headache(784.0)   . Hearing loss in left ear   . Hypercholesteremia   . Hyperlipidemia   . Missed ab 01/09/2012   10weeks-used cytotec  . Numbness and tingling   . PONV (postoperative nausea and vomiting)   . Preterm labor   . Teeth decayed    lower , nothing loose-encouraged pt to seek dental care if necessary    Past Surgical History:  Procedure Laterality Date  . CHOLECYSTECTOMY, LAPAROSCOPIC  12/06/2010  . DILATION AND CURETTAGE OF UTERUS  06/11/2012   Procedure: DILATATION AND CURETTAGE;  Surgeon: Geryl Rankins, MD;  Location: [facility];  Service: Gynecology;  Laterality: N/A;  sec procedure start  1241  . DILATION AND EVACUATION  01/16/2012  Procedure: DILATATION AND EVACUATION;  Surgeon: Geryl Rankins, MD;  Location: [facility];  Service: Gynecology;  Laterality: N/A;  . LAPAROSCOPY  06/11/2012   Procedure: LAPAROSCOPY OPERATIVE;  Surgeon: Geryl Rankins, MD;  Location: [facility];  Service: Gynecology;   Laterality: N/A;  . left ear drum rebuilt     x2  . TYMPANOPLASTY Left 04/23/2016   Procedure: TYMPANOPLASTY LEFT EAR;  Surgeon: Serena Colonel, MD;  Location: [facility];  Service: ENT;  Laterality: Left;  Marland Kitchen VAGINAL HYSTERECTOMY N/A 09/01/2012   Procedure: HYSTERECTOMY VAGINAL;  Surgeon: Geryl Rankins, MD;  Location: [facility];  Service: Gynecology;  Laterality: N/A;    Family Psychiatric History: Reviuewed.  Family History:  Family History  Problem Relation Age of Onset  . Cancer Other        family hx  . Diabetes Other        family hx  . Depression Other        family hx   . Coronary artery disease Mother        also MVD  . Hyperlipidemia Mother   . Hypertension Mother   . Hypertension Father   . Diabetes Father   . Diabetes Maternal Grandmother   . Colon cancer Paternal Grandmother   . Breast cancer Maternal Aunt   . Anesthesia problems Neg Hx     Social History:  Social History   Socioeconomic History  . Marital status: Married    Spouse name: Not on file  . Number of children: 2  . Years of education: GED  . Highest education level: Not on file  Occupational History  . Occupation: Housewife  Tobacco Use  . Smoking status: Former Smoker    Packs/day: 0.25    Years: 8.00    Pack years: 2.00    Types: Cigarettes  . Smokeless tobacco: Never Used  . Tobacco comment: up to 1/2 ppl x 8 years  Substance and Sexual Activity  . Alcohol use: No  . Drug use: No  . Sexual activity: Yes    Birth control/protection: Surgical  Other Topics Concern  . Not on file  Social History Narrative   Lives with daughter, son and husband in a one story home.  Has 2 children.  Does not work at this time.  Education: GED.    Right-handed.   4-5 twelve ounce cans per day.   Social Determinants of Health   Financial Resource Strain:   . Difficulty of Paying Living Expenses: Not on file  Food Insecurity:   . Worried About Programme researcher, broadcasting/film/video in the Last Year: Not on  file  . Ran Out of Food in the Last Year: Not on file  Transportation Needs:   . Lack of Transportation (Medical): Not on file  . Lack of Transportation (Non-Medical): Not on file  Physical Activity:   . Days of Exercise per Week: Not on file  . Minutes of Exercise per Session: Not on file  Stress:   . Feeling of Stress : Not on file  Social Connections:   . Frequency of Communication with Friends and Family: Not on file  . Frequency of Social Gatherings with Friends and Family: Not on file  . Attends Religious Services: Not on file  . Active Member of Clubs or Organizations: Not on file  . Attends Banker Meetings: Not on file  . Marital Status: Not on file    Allergies:  Allergies  Allergen Reactions  . Hydrocodone-Acetaminophen Hives and  Swelling    Hives  . Penicillins Hives    Metabolic Disorder Labs: No results found for: HGBA1C, MPG No results found for: PROLACTIN No results found for: CHOL, TRIG, HDL, CHOLHDL, VLDL, LDLCALC No results found for: TSH  Therapeutic Level Labs: No results found for: LITHIUM No results found for: VALPROATE No components found for:  CBMZ  Current Medications: Current Outpatient Medications  Medication Sig Dispense Refill  . buPROPion (WELLBUTRIN XL) 300 MG 24 hr tablet Take 1 tablet (300 mg total) by mouth daily. 90 tablet 1  . ciprofloxacin-dexamethasone (CIPRODEX) otic suspension Place 3 drops into the left ear 3 (three) times daily. 7.5 mL 2  . diazepam (VALIUM) 5 MG tablet Take 1 tablet (5 mg total) by mouth every 8 (eight) hours as needed for anxiety. 90 tablet 2  . diclofenac sodium (VOLTAREN) 1 % GEL Apply 4 g topically 4 (four) times daily as needed. 100 g 11  . dicyclomine (BENTYL) 10 MG capsule Take 10 mg by mouth 4 (four) times daily -  before meals and at bedtime.    Marland Kitchen ibuprofen (ADVIL,MOTRIN) 800 MG tablet Take 1 tablet (800 mg total) by mouth every 8 (eight) hours as needed for pain. For pain 30 tablet 2  .  lamoTRIgine (LAMICTAL) 25 MG tablet Take 1 tablet (25 mg total) by mouth at bedtime for 15 days, THEN 2 tablets (50 mg total) at bedtime for 15 days. 45 tablet 0  . lurasidone (LATUDA) 40 MG TABS tablet Take 1 tablet (40 mg total) by mouth daily with supper. 30 tablet 2  . meloxicam (MOBIC) 15 MG tablet Take 1 tablet (15 mg total) by mouth daily. One tab after meal 30 tablet 11  . Multiple Vitamin (MULTIVITAMIN WITH MINERALS) TABS Take 1 tablet by mouth daily.    . pantoprazole (PROTONIX) 40 MG tablet Take 1 tablet (40 mg total) by mouth daily. 30 tablet 0  . polyethylene glycol (MIRALAX / GLYCOLAX) packet Take 17 g by mouth daily.    . promethazine (PHENERGAN) 25 MG tablet TAKE (1) TABLET BY MOUTH EVERY EIGHT HOURS AS NEEDED FOR NAUSEA OR VOMITING. 90 tablet 0  . sucralfate (CARAFATE) 1 g tablet Take 1 tablet (1 g total) by mouth 4 (four) times daily -  with meals and at bedtime. 30 tablet 0   No current facility-administered medications for this visit.       Psychiatric Specialty Exam: Review of Systems  Gastrointestinal: Positive for nausea.  Psychiatric/Behavioral: Positive for dysphoric mood. The patient is nervous/anxious.   All other systems reviewed and are negative.   Last menstrual period 08/25/2012.There is no height or weight on file to calculate BMI.  General Appearance: NA  Eye Contact:  NA  Speech:  Clear and Coherent and Normal Rate  Volume:  Normal  Mood:  Anxious, Depressed and Irritable  Affect:  NA  Thought Process:  Goal Directed and Linear  Orientation:  Full (Time, Place, and Person)  Thought Content: Logical   Suicidal Thoughts:  No  Homicidal Thoughts:  No  Memory:  Immediate;   Good Recent;   Good Remote;   Good  Judgement:  Good  Insight:  Fair  Psychomotor Activity:  NA  Concentration:  Concentration: Fair  Recall:  Good  Fund of Knowledge: Good  Language: Good  Akathisia:  Negative  Handed:  Right  AIMS (if indicated): not done  Assets:   Communication Skills Desire for Improvement Financial Resources/Insurance Housing Social Support  ADL's:  Intact  Cognition: WNL  Sleep:  Fair     Assessment and Plan: 44yo married white female initially referred by her PCP for treatment of chronic depression and anxiety resistant to treatment. Monica Ballard reports being depressed and anxious (excessive worrying, unable to relax) for close to 20 years. It started in 2003 with loss of mother and grandmother within few days of each other. She has been tried by her PCP on multiple antidepressants (fluoxetine, sertraline, paroxetine, trazodone, citalopram, bupropion, venlafaxine, duloxetine) as well as buspirone for anxiety. She is now on Lexapro 20 mg and Wellbutrin XL 300 mg for past 3 months or so. None of these trials was successful as her depression and anxiety have not remitted. She also has been prescribed clonazepam for many years - now on 1 mg tod. She has never been suicidal, never psychiatrically hospitalized and has never been under care of a psychiatrist. She has no hx of mania, psychosis, alcohol or drug abuse. She reports recent decline of appetite but attributes it to taking Wellbutrin. She has no energy, poor sleep, low self esteem, anhedonia, poor concentration. All these symptoms are chronic.We have done GeneSight test but it did not reveal any contraindications/constraits in use of most antidepressants.While she does not endorse having clear hypomanic episodes(although she admits to feeling irritable, angry when depressed)these therapeutic failures combined with family hx of BPAD maysuggestpresence of bipolar depression and may call for a different therapeutic approach. Weadded lurasidone20 mg(tapered escitalopram off)and perhaps she noticed some improvement in mood.We then increased it further to 40 mg - well tolerated and further improvement in mood: less depressed, but still irritable. Monica Ballard lost her sister in September - she has  been more withdrawn, anxious. WE have changed clonazepam to diazepam which helps "some".   Dx: MDDwith mixed features(vs bipolar disorder, unspecified, depressed), GAD; Bereavement  Plan:We willcontinue bupropion XL 300 mg,diazepam 5 mg tid prn anxietyand Latuda40 mg.Phenergan prn nausea. We will ytry adding Lamictal (per usual titration) for mood. Patient is asking for and would benefit from resuming counseling appointments. Next appointment with me inone month.The plan was discussed with patient who had an opportunity to ask questions and these were all answered. I [id number] in phone consultation with the patient.   Magdalene Patricialgierd A Mikal Blasdell, MD 06/20/2020, 10:16 AM

## 2020-07-21 ENCOUNTER — Other Ambulatory Visit: Payer: Self-pay

## 2020-07-21 ENCOUNTER — Telehealth (INDEPENDENT_AMBULATORY_CARE_PROVIDER_SITE_OTHER): Payer: BC Managed Care – PPO | Admitting: Psychiatry

## 2020-07-21 DIAGNOSIS — F330 Major depressive disorder, recurrent, mild: Secondary | ICD-10-CM | POA: Diagnosis not present

## 2020-07-21 DIAGNOSIS — F411 Generalized anxiety disorder: Secondary | ICD-10-CM | POA: Diagnosis not present

## 2020-07-21 MED ORDER — LURASIDONE HCL 40 MG PO TABS
40.0000 mg | ORAL_TABLET | Freq: Every day | ORAL | 2 refills | Status: DC
Start: 2020-08-08 — End: 2020-08-22

## 2020-07-21 MED ORDER — LAMOTRIGINE 150 MG PO TABS: 150.0000 mg | ORAL_TABLET | Freq: Every evening | ORAL | 1 refills | Status: DC

## 2020-07-21 MED ORDER — DIAZEPAM 5 MG PO TABS
5.0000 mg | ORAL_TABLET | Freq: Three times a day (TID) | ORAL | 2 refills | Status: DC | PRN
Start: 2020-08-17 — End: 2020-09-26

## 2020-07-21 MED ORDER — BUPROPION HCL ER (XL) 300 MG PO TB24: 300.0000 mg | ORAL_TABLET | Freq: Every day | ORAL | 1 refills | Status: DC

## 2020-07-21 NOTE — Progress Notes (Signed)
BH MD/PA/NP OP Progress Note  07/21/2020 11:18 AM Monica Ballard  MRN:  [id number] Interview was conducted by phone and I verified that I was speaking with the correct person using two identifiers. I discussed the limitations of evaluation and management by telemedicine and  the availability of in person appointments. Patient expressed understanding and agreed to proceed. Participants in the visit: patient (location - home); physician (location - home office).  Chief Complaint: Still feeling moody.  HPI: 44yo married white female initially referred by her PCP for treatment of chronic depression and anxiety resistant to treatment. Monica Ballard reports being depressed and anxious (excessive worrying, unable to relax) for close to 20 years. It started in 2003 with loss of mother and grandmother within few days of each other. She has been tried by her PCP on multiple antidepressants (fluoxetine, sertraline, paroxetine, trazodone, citalopram, escitalopram, bupropion, venlafaxine, duloxetine) as well as buspirone for anxiety. None of these trials was successful as her depression and anxiety have not remitted. She also has been prescribed clonazepam for many years - now on 1 mg tod. She has never been suicidal, never psychiatrically hospitalized and has never been under care of a psychiatrist. She has no hx of mania, psychosis, alcohol or drug abuse. She reports recent decline of appetite but attributes it to taking Wellbutrin. She has no energy, poor sleep, low self esteem, anhedonia, poor concentration. All these symptoms are chronic.We have done GeneSight test but it did not reveal any contraindications/constraits in use of most antidepressants.While she does not endorse having clear hypomanic episodes(although she admits to feeling irritable, angry when depressed)these therapeutic failures combined with family hx of BPAD maysuggestpresence of bipolar depression and may call for a different therapeutic approach.  Weaddedlurasidone20 mg(tapered escitalopram off)and perhaps she noticed some improvement in mood.We then increased it further to 40 mg - well tolerated and further improvement in mood: less depressed, but still irritable.Monica Ballard lost her sister in September - she has been more withdrawn, anxious. We have changed clonazepam to diazepam which helps "some" and started titration of Lamictal.[name] has resumed counseling.    Visit Diagnosis:    ICD-10-CM   1. 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 Generalized anxiety disorder  F41.1     Past Psychiatric History: Please see intake H&P.  Past Medical History:  Past Medical History:  Diagnosis Date  . Anemia   . Anxiety   . Asthma    smoker-uses nebulizer-not used in last year, albuterol rescue inhaler used in 11/13 preoperatively  . Chronic bronchitis (HCC)    nebulizer and rescue inhaler  . Depression   . GERD (gastroesophageal reflux disease)    occasional -tums  . Headache(784.0)   . Hearing loss in left ear   . Hypercholesteremia   . Hyperlipidemia   . Missed ab 01/09/2012   10weeks-used cytotec  . Numbness and tingling   . PONV (postoperative nausea and vomiting)   . Preterm labor   . Teeth decayed    lower , nothing loose-encouraged pt to seek dental care if necessary    Past Surgical History:  Procedure Laterality Date  . CHOLECYSTECTOMY, LAPAROSCOPIC  12/06/2010  . DILATION AND CURETTAGE OF UTERUS  06/11/2012   Procedure: DILATATION AND CURETTAGE;  Surgeon: Geryl RankinsEvelyn Varnado, MD;  Location: [facility];  Service: Gynecology;  Laterality: N/A;  sec procedure start  1241  . DILATION AND EVACUATION  01/16/2012   Procedure: DILATATION AND EVACUATION;  Surgeon: Geryl RankinsEvelyn Varnado, MD;  Location: [facility];  Service: Gynecology;  Laterality: N/A;  . LAPAROSCOPY  06/11/2012   Procedure: LAPAROSCOPY OPERATIVE;  Surgeon: Geryl Rankins, MD;  Location: [facility];  Service: Gynecology;  Laterality:  N/A;  . left ear drum rebuilt     x2  . TYMPANOPLASTY Left 04/23/2016   Procedure: TYMPANOPLASTY LEFT EAR;  Surgeon: Serena Colonel, MD;  Location: [facility];  Service: ENT;  Laterality: Left;  Marland Kitchen VAGINAL HYSTERECTOMY N/A 09/01/2012   Procedure: HYSTERECTOMY VAGINAL;  Surgeon: Geryl Rankins, MD;  Location: [facility];  Service: Gynecology;  Laterality: N/A;    Family Psychiatric History: Reviewed.  Family History:  Family History  Problem Relation Age of Onset  . Cancer Other        family hx  . Diabetes Other        family hx  . Depression Other        family hx   . Coronary artery disease Mother        also MVD  . Hyperlipidemia Mother   . Hypertension Mother   . Hypertension Father   . Diabetes Father   . Diabetes Maternal Grandmother   . Colon cancer Paternal Grandmother   . Breast cancer Maternal Aunt   . Anesthesia problems Neg Hx     Social History:  Social History   Socioeconomic History  . Marital status: Married    Spouse name: Not on file  . Number of children: 2  . Years of education: GED  . Highest education level: Not on file  Occupational History  . Occupation: Housewife  Tobacco Use  . Smoking status: Former Smoker    Packs/day: 0.25    Years: 8.00    Pack years: 2.00    Types: Cigarettes  . Smokeless tobacco: Never Used  . Tobacco comment: up to 1/2 ppl x 8 years  Substance and Sexual Activity  . Alcohol use: No  . Drug use: No  . Sexual activity: Yes    Birth control/protection: Surgical  Other Topics Concern  . Not on file  Social History Narrative   Lives with daughter, son and husband in a one story home.  Has 2 children.  Does not work at this time.  Education: GED.    Right-handed.   4-5 twelve ounce cans per day.   Social Determinants of Health   Financial Resource Strain: Not on file  Food Insecurity: Not on file  Transportation Needs: Not on file  Physical Activity: Not on file  Stress: Not on file  Social  Connections: Not on file    Allergies:  Allergies  Allergen Reactions  . Hydrocodone-Acetaminophen Hives and Swelling    Hives  . Penicillins Hives    Metabolic Disorder Labs: No results found for: HGBA1C, MPG No results found for: PROLACTIN No results found for: CHOL, TRIG, HDL, CHOLHDL, VLDL, LDLCALC No results found for: TSH  Therapeutic Level Labs: No results found for: LITHIUM No results found for: VALPROATE No components found for:  CBMZ  Current Medications: Current Outpatient Medications  Medication Sig Dispense Refill  . [START ON 08/08/2020] buPROPion (WELLBUTRIN XL) 300 MG 24 hr tablet Take 1 tablet (300 mg total) by mouth daily. 90 tablet 1  . ciprofloxacin-dexamethasone (CIPRODEX) otic suspension Place 3 drops into the left ear 3 (three) times daily. 7.5 mL 2  . [START ON 08/17/2020] diazepam (VALIUM) 5 MG tablet Take 1 tablet (5 mg total) by mouth every 8 (eight) hours as needed for anxiety. 90  tablet 2  . diclofenac sodium (VOLTAREN) 1 % GEL Apply 4 g topically 4 (four) times daily as needed. 100 g 11  . dicyclomine (BENTYL) 10 MG capsule Take 10 mg by mouth 4 (four) times daily -  before meals and at bedtime.    Marland Kitchen ibuprofen (ADVIL,MOTRIN) 800 MG tablet Take 1 tablet (800 mg total) by mouth every 8 (eight) hours as needed for pain. For pain 30 tablet 2  . lamoTRIgine (LAMICTAL) 150 MG tablet Take 1 tablet (150 mg total) by mouth at bedtime. 30 tablet 1  . [START ON 08/08/2020] lurasidone (LATUDA) 40 MG TABS tablet Take 1 tablet (40 mg total) by mouth daily with supper. 30 tablet 2  . meloxicam (MOBIC) 15 MG tablet Take 1 tablet (15 mg total) by mouth daily. One tab after meal 30 tablet 11  . Multiple Vitamin (MULTIVITAMIN WITH MINERALS) TABS Take 1 tablet by mouth daily.    . pantoprazole (PROTONIX) 40 MG tablet Take 1 tablet (40 mg total) by mouth daily. 30 tablet 0  . polyethylene glycol (MIRALAX / GLYCOLAX) packet Take 17 g by mouth daily.    . promethazine  (PHENERGAN) 25 MG tablet TAKE (1) TABLET BY MOUTH EVERY EIGHT HOURS AS NEEDED FOR NAUSEA OR VOMITING. 90 tablet 0  . sucralfate (CARAFATE) 1 g tablet Take 1 tablet (1 g total) by mouth 4 (four) times daily -  with meals and at bedtime. 30 tablet 0   No current facility-administered medications for this visit.      Psychiatric Specialty Exam: Review of Systems  Psychiatric/Behavioral: Positive for dysphoric mood. The patient is nervous/anxious.   All other systems reviewed and are negative.   Last menstrual period 08/25/2012.There is no height or weight on file to calculate BMI.  General Appearance: NA  Eye Contact:  NA  Speech:  Clear and Coherent and Normal Rate  Volume:  Normal  Mood:  Anxious, Depressed and Irritable  Affect:  NA  Thought Process:  Goal Directed and Linear  Orientation:  Full (Time, Place, and Person)  Thought Content: Logical   Suicidal Thoughts:  No  Homicidal Thoughts:  No  Memory:  Immediate;   Fair Recent;   Fair Remote;   Good  Judgement:  Good  Insight:  Good  Psychomotor Activity:  NA  Concentration:  Concentration: Fair  Recall:  Fair  Fund of Knowledge: Good  Language: Good  Akathisia:  Negative  Handed:  Right  AIMS (if indicated): not done  Assets:  Communication Skills Desire for Improvement Financial Resources/Insurance Housing Social Support Talents/Skills  ADL's:  Intact  Cognition: WNL  Sleep:  Fair   Assessment and Plan: 44yo married white female initially referred by her PCP for treatment of chronic depression and anxiety resistant to treatment. Emmarae reports being depressed and anxious (excessive worrying, unable to relax) for close to 20 years. It started in 2003 with loss of mother and grandmother within few days of each other. She has been tried by her PCP on multiple antidepressants (fluoxetine, sertraline, paroxetine, trazodone, citalopram, escitalopram, bupropion, venlafaxine, duloxetine) as well as buspirone for anxiety.  None of these trials was successful as her depression and anxiety have not remitted. She also has been prescribed clonazepam for many years - now on 1 mg tod. She has never been suicidal, never psychiatrically hospitalized and has never been under care of a psychiatrist. She has no hx of mania, psychosis, alcohol or drug abuse. She reports recent decline of appetite but attributes it  to taking Wellbutrin. She has no energy, poor sleep, low self esteem, anhedonia, poor concentration. All these symptoms are chronic.We have done GeneSight test but it did not reveal any contraindications/constraits in use of most antidepressants.While she does not endorse having clear hypomanic episodes(although she admits to feeling irritable, angry when depressed)these therapeutic failures combined with family hx of BPAD maysuggestpresence of bipolar depression and may call for a different therapeutic approach. Weaddedlurasidone20 mg(tapered escitalopram off)and perhaps she noticed some improvement in mood.We then increased it further to 40 mg - well tolerated and further improvement in mood: less depressed, but still irritable.Sabreena lost her sister in September - she has been more withdrawn, anxious. We have changed clonazepam to diazepam which helps "some" and started titration of Lamictal.Azuree has resumed counseling.   Dx: MDDwith mixed features(vsbipolardisorder, unspecified,depressed), GAD; Bereavement  Plan:We willcontinue bupropion XL 300 mg,diazepam 5 mg tidprn anxietyandLatuda40 mg.We will increase Lamictal to 75 mg x 1 week then to 150 mg at HS. Next appointment with me inone month.The plan was discussed with patient who had an opportunity to ask questions and these were all answered. I spend86minutes in phone consultation with the patient.   Magdalene Patricia, MD 07/21/2020, 11:18 AM

## 2020-08-02 ENCOUNTER — Other Ambulatory Visit (HOSPITAL_COMMUNITY): Payer: Self-pay | Admitting: Psychiatry

## 2020-08-22 ENCOUNTER — Encounter (HOSPITAL_COMMUNITY): Payer: Self-pay

## 2020-08-22 ENCOUNTER — Other Ambulatory Visit: Payer: Self-pay

## 2020-08-22 ENCOUNTER — Telehealth (INDEPENDENT_AMBULATORY_CARE_PROVIDER_SITE_OTHER): Payer: BC Managed Care – PPO | Admitting: Psychiatry

## 2020-08-22 DIAGNOSIS — F330 Major depressive disorder, recurrent, mild: Secondary | ICD-10-CM | POA: Diagnosis not present

## 2020-08-22 DIAGNOSIS — F411 Generalized anxiety disorder: Secondary | ICD-10-CM

## 2020-08-22 MED ORDER — PROMETHAZINE HCL 25 MG PO TABS: 25.0000 mg | ORAL_TABLET | Freq: Three times a day (TID) | ORAL | 1 refills | Status: DC | PRN

## 2020-08-22 MED ORDER — LURASIDONE HCL 20 MG PO TABS: 20.0000 mg | ORAL_TABLET | Freq: Every evening | ORAL | 2 refills | Status: DC

## 2020-08-22 MED ORDER — LAMOTRIGINE 200 MG PO TABS: 200.0000 mg | ORAL_TABLET | Freq: Every evening | ORAL | 2 refills | Status: DC

## 2020-08-22 NOTE — Progress Notes (Signed)
BH MD/PA/NP OP Progress Note  08/22/2020 11:19 AM Monica Ballard  MRN:  [id number] Interview was conducted by phone and I verified that I was speaking with the correct person using two identifiers. I discussed the limitations of evaluation and management by telemedicine and  the availability of in person appointments. Patient expressed understanding and agreed to proceed. Participants in the visit: patient (location - home); physician (location - home office).  Chief Complaint: Anxiety, weight gain.  HPI: 45yo married white female initially referred by her PCP for treatment of chronic depression and anxiety resistant to treatment. Monica Ballard reports being depressed and anxious (excessive worrying, unable to relax) for close to 20 years. It started in 2003 with loss of mother and grandmother within few days of each other. She has been tried by her PCP on multiple antidepressants (fluoxetine, sertraline, paroxetine, trazodone, citalopram, escitalopram, bupropion, venlafaxine, duloxetine) as well as buspirone for anxiety. None of these trials was successful as her depression and anxiety have not remitted. She also has been prescribed clonazepam for many years - now on 1 mg tod. She has never been suicidal, never psychiatrically hospitalized and has never been under care of a psychiatrist. She has no hx of mania, psychosis, alcohol or drug abuse. She reports recent decline of appetite but attributes it to taking Wellbutrin. She has no energy, poor sleep, low self esteem, anhedonia, poor concentration. All these symptoms are chronic.We have done GeneSight test but it did not reveal any contraindications/constraits in use of most antidepressants.While she does not endorse having clear hypomanic episodes(although she admits to feeling irritable, angry when depressed)these therapeutic failures combined with family hx of BPAD maysuggestpresence of bipolar depression and may call for a different therapeutic approach.  Weaddedlurasidone20 mg(tapered escitalopram off)and perhaps she noticed some improvement in mood.We then increased it further to 40 mg - well tolerated and further improvement in mood: less depressed,but still irritable.Monica Ballard lost her sisterin September- she has been more withdrawn, anxious. We have changed clonazepam to diazepam which helps "some" and started titration of Lamictal.Monica Ballard has resumed counseling. She noticed gradual weigh gain - likely due to Jordan.   Visit Diagnosis:    ICD-10-CM   1. Generalized anxiety disorder  F41.1   2. 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  Past Medical History:  Past Medical History:  Diagnosis Date  . Anemia   . Anxiety   . Asthma    smoker-uses nebulizer-not used in last year, albuterol rescue inhaler used in 11/13 preoperatively  . Chronic bronchitis (HCC)    nebulizer and rescue inhaler  . Depression   . GERD (gastroesophageal reflux disease)    occasional -tums  . Headache(784.0)   . Hearing loss in left ear   . Hypercholesteremia   . Hyperlipidemia   . Missed ab 01/09/2012   10weeks-used cytotec  . Numbness and tingling   . PONV (postoperative nausea and vomiting)   . Preterm labor   . Teeth decayed    lower , nothing loose-encouraged pt to seek dental care if necessary    Past Surgical History:  Procedure Laterality Date  . CHOLECYSTECTOMY, LAPAROSCOPIC  12/06/2010  . DILATION AND CURETTAGE OF UTERUS  06/11/2012   Procedure: DILATATION AND CURETTAGE;  Surgeon: Geryl Rankins, MD;  Location: [facility];  Service: Gynecology;  Laterality: N/A;  sec procedure start  1241  . DILATION AND EVACUATION  01/16/2012   Procedure: DILATATION AND  EVACUATION;  Surgeon: Geryl Rankins, MD;  Location: [facility];  Service: Gynecology;  Laterality: N/A;  . LAPAROSCOPY  06/11/2012   Procedure: LAPAROSCOPY OPERATIVE;  Surgeon: Geryl Rankins, MD;   Location: [facility];  Service: Gynecology;  Laterality: N/A;  . left ear drum rebuilt     x2  . TYMPANOPLASTY Left 04/23/2016   Procedure: TYMPANOPLASTY LEFT EAR;  Surgeon: Serena Colonel, MD;  Location: [facility];  Service: ENT;  Laterality: Left;  Marland Kitchen VAGINAL HYSTERECTOMY N/A 09/01/2012   Procedure: HYSTERECTOMY VAGINAL;  Surgeon: Geryl Rankins, MD;  Location: [facility];  Service: Gynecology;  Laterality: N/A;    Family Psychiatric History: Reviewed.  Family History:  Family History  Problem Relation Age of Onset  . Cancer Other        family hx  . Diabetes Other        family hx  . Depression Other        family hx   . Coronary artery disease Mother        also MVD  . Hyperlipidemia Mother   . Hypertension Mother   . Hypertension Father   . Diabetes Father   . Diabetes Maternal Grandmother   . Colon cancer Paternal Grandmother   . Breast cancer Maternal Aunt   . Anesthesia problems Neg Hx     Social History:  Social History   Socioeconomic History  . Marital status: Married    Spouse name: Not on file  . Number of children: 2  . Years of education: GED  . Highest education level: Not on file  Occupational History  . Occupation: Housewife  Tobacco Use  . Smoking status: Former Smoker    Packs/day: 0.25    Years: 8.00    Pack years: 2.00    Types: Cigarettes  . Smokeless tobacco: Never Used  . Tobacco comment: up to 1/2 ppl x 8 years  Substance and Sexual Activity  . Alcohol use: No  . Drug use: No  . Sexual activity: Yes    Birth control/protection: Surgical  Other Topics Concern  . Not on file  Social History Narrative   Lives with daughter, son and husband in a one story home.  Has 2 children.  Does not work at this time.  Education: GED.    Right-handed.   4-5 twelve ounce cans per day.   Social Determinants of Health   Financial Resource Strain: Not on file  Food Insecurity: Not on file  Transportation Needs: Not on file  Physical  Activity: Not on file  Stress: Not on file  Social Connections: Not on file    Allergies:  Allergies  Allergen Reactions  . Hydrocodone-Acetaminophen Hives and Swelling    Hives  . Penicillins Hives    Metabolic Disorder Labs: No results found for: HGBA1C, MPG No results found for: PROLACTIN No results found for: CHOL, TRIG, HDL, CHOLHDL, VLDL, LDLCALC No results found for: TSH  Therapeutic Level Labs: No results found for: LITHIUM No results found for: VALPROATE No components found for:  CBMZ  Current Medications: Current Outpatient Medications  Medication Sig Dispense Refill  . lamoTRIgine (LAMICTAL) 200 MG tablet Take 1 tablet (200 mg total) by mouth at bedtime. 30 tablet 2  . lurasidone (LATUDA) 20 MG TABS tablet Take 1 tablet (20 mg total) by mouth at bedtime. 30 tablet 2  . buPROPion (WELLBUTRIN XL) 300 MG 24 hr tablet Take 1 tablet (300 mg total) by mouth daily. 90 tablet 1  .  ciprofloxacin-dexamethasone (CIPRODEX) otic suspension Place 3 drops into the left ear 3 (three) times daily. 7.5 mL 2  . diazepam (VALIUM) 5 MG tablet Take 1 tablet (5 mg total) by mouth every 8 (eight) hours as needed for anxiety. 90 tablet 2  . diclofenac sodium (VOLTAREN) 1 % GEL Apply 4 g topically 4 (four) times daily as needed. 100 g 11  . dicyclomine (BENTYL) 10 MG capsule Take 10 mg by mouth 4 (four) times daily -  before meals and at bedtime.    Marland Kitchen ibuprofen (ADVIL,MOTRIN) 800 MG tablet Take 1 tablet (800 mg total) by mouth every 8 (eight) hours as needed for pain. For pain 30 tablet 2  . meloxicam (MOBIC) 15 MG tablet Take 1 tablet (15 mg total) by mouth daily. One tab after meal 30 tablet 11  . Multiple Vitamin (MULTIVITAMIN WITH MINERALS) TABS Take 1 tablet by mouth daily.    . pantoprazole (PROTONIX) 40 MG tablet Take 1 tablet (40 mg total) by mouth daily. 30 tablet 0  . polyethylene glycol (MIRALAX / GLYCOLAX) packet Take 17 g by mouth daily.    . promethazine (PHENERGAN) 25 MG tablet  Take 1 tablet (25 mg total) by mouth 3 (three) times daily as needed for nausea or vomiting. 90 tablet 1  . sucralfate (CARAFATE) 1 g tablet Take 1 tablet (1 g total) by mouth 4 (four) times daily -  with meals and at bedtime. 30 tablet 0   No current facility-administered medications for this visit.     Psychiatric Specialty Exam: Review of Systems  Constitutional: Positive for unexpected weight change.  Gastrointestinal: Positive for nausea.  Psychiatric/Behavioral: The patient is nervous/anxious.   All other systems reviewed and are negative.   Last menstrual period 08/25/2012.There is no height or weight on file to calculate BMI.  General Appearance: NA  Eye Contact:  NA  Speech:  Clear and Coherent and Normal Rate  Volume:  Normal  Mood:  Anxious  Affect:  NA  Thought Process:  Goal Directed and Linear  Orientation:  Full (Time, Place, and Person)  Thought Content: Rumination   Suicidal Thoughts:  No  Homicidal Thoughts:  No  Memory:  Immediate;   Good Recent;   Good Remote;   Good  Judgement:  Good  Insight:  Fair  Psychomotor Activity:  NA  Concentration:  Concentration: Good  Recall:  Good  Fund of Knowledge: Good  Language: Good  Akathisia:  Negative  Handed:  Right  AIMS (if indicated): not done  Assets:  Communication Skills Desire for Improvement Financial Resources/Insurance Housing Social Support Talents/Skills  ADL's:  Intact  Cognition: WNL  Sleep:  Fair    Assessment and Plan: 44yo married white female initially referred by her PCP for treatment of chronic depression and anxiety resistant to treatment. Monserrath reports being depressed and anxious (excessive worrying, unable to relax) for close to 20 years. It started in 2003 with loss of mother and grandmother within few days of each other. She has been tried by her PCP on multiple antidepressants (fluoxetine, sertraline, paroxetine, trazodone, citalopram, escitalopram, bupropion, venlafaxine,  duloxetine) as well as buspirone for anxiety. None of these trials was successful as her depression and anxiety have not remitted. She also has been prescribed clonazepam for many years - now on 1 mg tod. She has never been suicidal, never psychiatrically hospitalized and has never been under care of a psychiatrist. She has no hx of mania, psychosis, alcohol or drug abuse. She reports recent  decline of appetite but attributes it to taking Wellbutrin. She has no energy, poor sleep, low self esteem, anhedonia, poor concentration. All these symptoms are chronic.We have done GeneSight test but it did not reveal any contraindications/constraits in use of most antidepressants.While she does not endorse having clear hypomanic episodes(although she admits to feeling irritable, angry when depressed)these therapeutic failures combined with family hx of BPAD maysuggestpresence of bipolar depression and may call for a different therapeutic approach. Weaddedlurasidone20 mg(tapered escitalopram off)and perhaps she noticed some improvement in mood.We then increased it further to 40 mg - well tolerated and further improvement in mood: less depressed,but still irritable.Monica Ballard lost her sisterin September- she has been more withdrawn, anxious. We have changed clonazepam to diazepam which helps "some" and started titration of Lamictal.Monica Ballard has resumed counseling. She noticed gradual weigh gain - likely due to Jordan.  Dx: MDDwithmixedfeatures(vsbipolardisorder, unspecified,depressed), GAD  Plan:We willcontinue bupropion XL 300 mg,diazepam 5 mg tidprn anxiety, decreaseLatudato 20 mg and increase Lamictal to 200 mg ah HS. Next appointment with me inonemonth.The plan was discussed with patient who had an opportunity to ask questions and these were all answered. I spend50minutes inphone consultationwith the patient.    Magdalene Patricia, MD 08/22/2020, 11:19 AM

## 2020-09-26 ENCOUNTER — Other Ambulatory Visit: Payer: Self-pay

## 2020-09-26 ENCOUNTER — Telehealth (INDEPENDENT_AMBULATORY_CARE_PROVIDER_SITE_OTHER): Payer: BC Managed Care – PPO | Admitting: Psychiatry

## 2020-09-26 DIAGNOSIS — F330 Major depressive disorder, recurrent, mild: Secondary | ICD-10-CM

## 2020-09-26 DIAGNOSIS — F411 Generalized anxiety disorder: Secondary | ICD-10-CM

## 2020-09-26 MED ORDER — DIAZEPAM 10 MG PO TABS: 10.0000 mg | ORAL_TABLET | Freq: Three times a day (TID) | ORAL | 2 refills | Status: DC | PRN

## 2020-09-26 MED ORDER — LURASIDONE HCL 40 MG PO TABS: 40.0000 mg | ORAL_TABLET | Freq: Every day | ORAL | 2 refills | Status: DC

## 2020-09-26 NOTE — Progress Notes (Signed)
BH MD/PA/NP OP Progress Note  09/26/2020 11:45 AM [name]  MRN:  [id number] Interview was conducted by phone and I verified that I was speaking with the correct person using two identifiers. I discussed the limitations of evaluation and management by telemedicine and  the availability of in person appointments. Patient expressed understanding and agreed to proceed. Participants in the visit: patient (location - home); physician (location - home office).  Chief Complaint: Anxiety, depression.  HPI: 45yo married white female initially referred by her PCP for treatment of chronic depression and anxiety resistant to treatment. Monica Ballard reports being depressed and anxious (excessive worrying, unable to relax) for close to 20 years. It started in 2003 with loss of mother and grandmother within few days of each other. She has been tried by her PCP on multiple antidepressants (fluoxetine, sertraline, paroxetine, trazodone, citalopram,escitalopram,bupropion, venlafaxine, duloxetine) as well as buspirone for anxiety. None of these trials was successful as her depression and anxiety have not remitted. She also has been prescribed clonazepam for many years - now on 1 mg tid. She has never been suicidal, never psychiatrically hospitalized and has never been under care of a psychiatrist. She has no hx of mania, psychosis, alcohol or drug abuse. She reports recent decline of appetite but attributes it to taking Wellbutrin. She has no energy, poor sleep, low self esteem, anhedonia, poor concentration. All these symptoms are chronic.We have done GeneSight test but it did not reveal any contraindications/constraits in use of most antidepressants.While she does not endorse having clear hypomanic episodes(although she admits to feeling irritable, angry when depressed)these therapeutic failures combined with family hx of BPAD maysuggestpresence of bipolar depression and may call for a different therapeutic approach.  Weaddedlurasidone20 mg(tapered escitalopram off)and perhaps she noticed some improvement in mood.We then increased clonazepam to diazepam which helps "some" and started titration of Lamictal.Monica Ballard has resumedit further to 40 mg - well tolerated and further improvement in mood: less depressed,but still irritable.Huldah lost her sisterin September- she has been more withdrawn, anxious. Wehave changed  counseling.She noticed gradual weigh gain - possibly due to Jordan so we decreased dose to 20 mg. Her appetite declined but so did her mood..   Visit Diagnosis:    ICD-10-CM   1. 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 Generalized anxiety disorder  F41.1     Past Psychiatric History: Please see intake H&P.  Past Medical History:  Past Medical History:  Diagnosis Date  . Anemia   . Anxiety   . Asthma    smoker-uses nebulizer-not used in last year, albuterol rescue inhaler used in 11/13 preoperatively  . Chronic bronchitis (HCC)    nebulizer and rescue inhaler  . Depression   . GERD (gastroesophageal reflux disease)    occasional -tums  . Headache(784.0)   . Hearing loss in left ear   . Hypercholesteremia   . Hyperlipidemia   . Missed ab 01/09/2012   10weeks-used cytotec  . Numbness and tingling   . PONV (postoperative nausea and vomiting)   . Preterm labor   . Teeth decayed    lower , nothing loose-encouraged pt to seek dental care if necessary    Past Surgical History:  Procedure Laterality Date  . CHOLECYSTECTOMY, LAPAROSCOPIC  12/06/2010  . DILATION AND CURETTAGE OF UTERUS  06/11/2012   Procedure: DILATATION AND CURETTAGE;  Surgeon: Geryl Rankins, MD;  Location: [facility];  Service: Gynecology;  Laterality: N/A;  sec procedure start  1241  .  DILATION AND EVACUATION  01/16/2012   Procedure: DILATATION AND EVACUATION;  Surgeon: Geryl Rankins, MD;  Location: [facility];  Service: Gynecology;  Laterality: N/A;  . LAPAROSCOPY   06/11/2012   Procedure: LAPAROSCOPY OPERATIVE;  Surgeon: Geryl Rankins, MD;  Location: [facility];  Service: Gynecology;  Laterality: N/A;  . left ear drum rebuilt     x2  . TYMPANOPLASTY Left 04/23/2016   Procedure: TYMPANOPLASTY LEFT EAR;  Surgeon: Serena Colonel, MD;  Location: [facility];  Service: ENT;  Laterality: Left;  Marland Kitchen VAGINAL HYSTERECTOMY N/A 09/01/2012   Procedure: HYSTERECTOMY VAGINAL;  Surgeon: Geryl Rankins, MD;  Location: [facility];  Service: Gynecology;  Laterality: N/A;    Family Psychiatric History: Reviewed.  Family History:  Family History  Problem Relation Age of Onset  . Cancer Other        family hx  . Diabetes Other        family hx  . Depression Other        family hx   . Coronary artery disease Mother        also MVD  . Hyperlipidemia Mother   . Hypertension Mother   . Hypertension Father   . Diabetes Father   . Diabetes Maternal Grandmother   . Colon cancer Paternal Grandmother   . Breast cancer Maternal Aunt   . Anesthesia problems Neg Hx     Social History:  Social History   Socioeconomic History  . Marital status: Married    Spouse name: Not on file  . Number of children: 2  . Years of education: GED  . Highest education level: Not on file  Occupational History  . Occupation: Housewife  Tobacco Use  . Smoking status: Former Smoker    Packs/day: 0.25    Years: 8.00    Pack years: 2.00    Types: Cigarettes  . Smokeless tobacco: Never Used  . Tobacco comment: up to 1/2 ppl x 8 years  Substance and Sexual Activity  . Alcohol use: No  . Drug use: No  . Sexual activity: Yes    Birth control/protection: Surgical  Other Topics Concern  . Not on file  Social History Narrative   Lives with daughter, son and husband in a one story home.  Has 2 children.  Does not work at this time.  Education: GED.    Right-handed.   4-5 twelve ounce cans per day.   Social Determinants of Health   Financial Resource Strain: Not on file  Food  Insecurity: Not on file  Transportation Needs: Not on file  Physical Activity: Not on file  Stress: Not on file  Social Connections: Not on file    Allergies:  Allergies  Allergen Reactions  . Hydrocodone-Acetaminophen Hives and Swelling    Hives  . Penicillins Hives    Metabolic Disorder Labs: No results found for: HGBA1C, MPG No results found for: PROLACTIN No results found for: CHOL, TRIG, HDL, CHOLHDL, VLDL, LDLCALC No results found for: TSH  Therapeutic Level Labs: No results found for: LITHIUM No results found for: VALPROATE No components found for:  CBMZ  Current Medications: Current Outpatient Medications  Medication Sig Dispense Refill  . lurasidone (LATUDA) 40 MG TABS tablet Take 1 tablet (40 mg total) by mouth daily with supper. 30 tablet 2  . buPROPion (WELLBUTRIN XL) 300 MG 24 hr tablet Take 1 tablet (300 mg total) by mouth daily. 90 tablet 1  . ciprofloxacin-dexamethasone (CIPRODEX) otic suspension Place 3 drops into the  left ear 3 (three) times daily. 7.5 mL 2  . diazepam (VALIUM) 10 MG tablet Take 1 tablet (10 mg total) by mouth every 8 (eight) hours as needed for anxiety. 90 tablet 2  . diclofenac sodium (VOLTAREN) 1 % GEL Apply 4 g topically 4 (four) times daily as needed. 100 g 11  . dicyclomine (BENTYL) 10 MG capsule Take 10 mg by mouth 4 (four) times daily -  before meals and at bedtime.    Marland Kitchen. ibuprofen (ADVIL,MOTRIN) 800 MG tablet Take 1 tablet (800 mg total) by mouth every 8 (eight) hours as needed for pain. For pain 30 tablet 2  . lamoTRIgine (LAMICTAL) 200 MG tablet Take 1 tablet (200 mg total) by mouth at bedtime. 30 tablet 2  . meloxicam (MOBIC) 15 MG tablet Take 1 tablet (15 mg total) by mouth daily. One tab after meal 30 tablet 11  . Multiple Vitamin (MULTIVITAMIN WITH MINERALS) TABS Take 1 tablet by mouth daily.    . pantoprazole (PROTONIX) 40 MG tablet Take 1 tablet (40 mg total) by mouth daily. 30 tablet 0  . polyethylene glycol (MIRALAX /  GLYCOLAX) packet Take 17 g by mouth daily.    . promethazine (PHENERGAN) 25 MG tablet Take 1 tablet (25 mg total) by mouth 3 (three) times daily as needed for nausea or vomiting. 90 tablet 1  . sucralfate (CARAFATE) 1 g tablet Take 1 tablet (1 g total) by mouth 4 (four) times daily -  with meals and at bedtime. 30 tablet 0   No current facility-administered medications for this visit.     Psychiatric Specialty Exam: Review of Systems  Constitutional: Positive for appetite change.  Psychiatric/Behavioral: Positive for sleep disturbance. The patient is nervous/anxious.   All other systems reviewed and are negative.   Last menstrual period 08/25/2012.There is no height or weight on file to calculate BMI.  General Appearance: NA  Eye Contact:  NA  Speech:  Clear and Coherent  Volume:  Normal  Mood:  Anxious and Depressed  Affect:  NA  Thought Process:  Goal Directed  Orientation:  Full (Time, Place, and Person)  Thought Content: Rumination   Suicidal Thoughts:  No  Homicidal Thoughts:  No  Memory:  Immediate;   Good Recent;   Good Remote;   Good  Judgement:  Good  Insight:  Fair  Psychomotor Activity:  NA  Concentration:  Concentration: Fair  Recall:  Good  Fund of Knowledge: Good  Language: Good  Akathisia:  Negative  Handed:  Right  AIMS (if indicated): not done  Assets:  Communication Skills Desire for Improvement Financial Resources/Insurance Housing Social Support Talents/Skills  ADL's:  Intact  Cognition: WNL  Sleep:  Fair    Assessment and Plan: 44yo married white female initially referred by her PCP for treatment of chronic depression and anxiety resistant to treatment. Monica Ballard reports being depressed and anxious (excessive worrying, unable to relax) for close to 20 years. It started in 2003 with loss of mother and grandmother within few days of each other. She has been tried by her PCP on multiple antidepressants (fluoxetine, sertraline, paroxetine, trazodone,  citalopram,escitalopram,bupropion, venlafaxine, duloxetine) as well as buspirone for anxiety. None of these trials was successful as her depression and anxiety have not remitted. She also has been prescribed clonazepam for many years - now on 1 mg tid. She has never been suicidal, never psychiatrically hospitalized and has never been under care of a psychiatrist. She has no hx of mania, psychosis, alcohol or drug  abuse. She reports recent decline of appetite but attributes it to taking Wellbutrin. She has no energy, poor sleep, low self esteem, anhedonia, poor concentration. All these symptoms are chronic.We have done GeneSight test but it did not reveal any contraindications/constraits in use of most antidepressants.While she does not endorse having clear hypomanic episodes(although she admits to feeling irritable, angry when depressed)these therapeutic failures combined with family hx of BPAD maysuggestpresence of bipolar depression and may call for a different therapeutic approach. Weaddedlurasidone20 mg(tapered escitalopram off)and perhaps she noticed some improvement in mood.We then increased clonazepam to diazepam which helps "some" and started titration of Lamictal.Monica Ballard has resumedit further to 40 mg - well tolerated and further improvement in mood: less depressed,but still irritable.Monica Ballard lost her sisterin September- she has been more withdrawn, anxious. Wehave changed  counseling.She noticed gradual weigh gain - possibly due to Jordan so we decreased dose to 20 mg. Her appetite declined but so did her mood..  Dx: MDDwithmixedfeatures(vsbipolardisorder, unspecified,depressed); GAD ; Bereavement  Plan:We willcontinue bupropion XL 300 mg,increase diazepam to 10 mg tidprn anxiety, increaseLatudaback to 40 mg and continue Lamictal200 mg ah HS.She will be referred to counseling. Next med management appointment with inonemonth with a new provider.The plan was  discussed with patient who had an opportunity to ask questions and these were all answered. I spend28minutes inphone consultationwith the patient.   Magdalene Patricia, MD 09/26/2020, 11:45 AM

## 2020-10-25 ENCOUNTER — Other Ambulatory Visit (HOSPITAL_COMMUNITY): Payer: Self-pay | Admitting: Psychiatry

## 2020-11-21 ENCOUNTER — Telehealth (INDEPENDENT_AMBULATORY_CARE_PROVIDER_SITE_OTHER): Payer: BC Managed Care – PPO | Admitting: Psychiatry

## 2020-11-21 ENCOUNTER — Other Ambulatory Visit: Payer: Self-pay

## 2020-11-21 DIAGNOSIS — F3341 Major depressive disorder, recurrent, in partial remission: Secondary | ICD-10-CM | POA: Diagnosis not present

## 2020-11-21 DIAGNOSIS — M5030 Other cervical disc degeneration, unspecified cervical region: Secondary | ICD-10-CM | POA: Insufficient documentation

## 2020-11-21 NOTE — Progress Notes (Signed)
BH MD/PA/NP OP Progress Note  11/21/2020 9:34 AM Monica Ballard  MRN:  [id number] Interview was conducted by phone and I verified that I was speaking with the correct person using two identifiers. I discussed the limitations of evaluation and management by telemedicine and  the availability of in person appointments. Patient expressed understanding and agreed to proceed. Participants in the visit: patient (location - home); physician (location - clinic office). Duration 25 minutes  Chief Complaint: Anxiety, depression.  HPI: 45yo married white female initially referred by her PCP for treatment of chronic depression and anxiety resistant to treatment. Reported history of chronic anxiety, depression over a period of years, with several antidepressant trials in the past that she did not feel worked.  She has no hx of mania, psychosis, alcohol or drug abuse.   Her sister passed away last year in April 19, 2023 which resulted in an exacerbation of depression.  Today  reports she continues to experience some depression and anxiety, although characterizes her mood as partially improved .  She endorses some ongoing neuro-vegetative symptoms such as decreased energy level and some anhedonia. Denies suicidal ideations . Denies psychotic symptoms. She continues to grieve the death of her sister, who passed away unexpectedly inSept of last year. States she feels she has not been able to return to her normal mood and level of functioning since then .   She denies medication side effects but states she feels " dizzy" at times . She reports she is taking medications as prescribed . She denies medication side effects and states Wellbutrin has been the most effective antidepressant she has been on thus far . She also feels Valium PRNs help for anxiety, albeit temporarily .  Visit Diagnosis:  No diagnosis found.  Past Psychiatric History: Please see intake H&P.  Past Medical History:  Past Medical History:   Diagnosis Date  . Anemia   . Anxiety   . Asthma    smoker-uses nebulizer-not used in last year, albuterol rescue inhaler used in 11/13 preoperatively  . Chronic bronchitis (HCC)    nebulizer and rescue inhaler  . Depression   . GERD (gastroesophageal reflux disease)    occasional -tums  . Headache(784.0)   . Hearing loss in left ear   . Hypercholesteremia   . Hyperlipidemia   . Missed ab 01/09/2012   10weeks-used cytotec  . Numbness and tingling   . PONV (postoperative nausea and vomiting)   . Preterm labor   . Teeth decayed    lower , nothing loose-encouraged pt to seek dental care if necessary    Past Surgical History:  Procedure Laterality Date  . CHOLECYSTECTOMY, LAPAROSCOPIC  12/06/2010  . DILATION AND CURETTAGE OF UTERUS  06/11/2012   Procedure: DILATATION AND CURETTAGE;  Surgeon: Geryl Rankins, MD;  Location: [facility];  Service: Gynecology;  Laterality: N/A;  sec procedure start  1241  . DILATION AND EVACUATION  01/16/2012   Procedure: DILATATION AND EVACUATION;  Surgeon: Geryl Rankins, MD;  Location: [facility];  Service: Gynecology;  Laterality: N/A;  . LAPAROSCOPY  06/11/2012   Procedure: LAPAROSCOPY OPERATIVE;  Surgeon: Geryl Rankins, MD;  Location: [facility];  Service: Gynecology;  Laterality: N/A;  . left ear drum rebuilt     x2  . TYMPANOPLASTY Left 04/23/2016   Procedure: TYMPANOPLASTY LEFT EAR;  Surgeon: Serena Colonel, MD;  Location: [facility];  Service: ENT;  Laterality: Left;  Marland Kitchen VAGINAL HYSTERECTOMY N/A 09/01/2012   Procedure: HYSTERECTOMY VAGINAL;  Surgeon: Geryl Rankins, MD;  Location: [facility];  Service: Gynecology;  Laterality: N/A;    Family Psychiatric History: Reviewed.  Family History:  Family History  Problem Relation Age of Onset  . Cancer Other        family hx  . Diabetes Other        family hx  . Depression Other        family hx   . Coronary artery disease Mother        also MVD  . Hyperlipidemia Mother   . Hypertension Mother    . Hypertension Father   . Diabetes Father   . Diabetes Maternal Grandmother   . Colon cancer Paternal Grandmother   . Breast cancer Maternal Aunt   . Anesthesia problems Neg Hx     Social History:  Social History   Socioeconomic History  . Marital status: Married    Spouse name: Not on file  . Number of children: 2  . Years of education: GED  . Highest education level: Not on file  Occupational History  . Occupation: Housewife  Tobacco Use  . Smoking status: Former Smoker    Packs/day: 0.25    Years: 8.00    Pack years: 2.00    Types: Cigarettes  . Smokeless tobacco: Never Used  . Tobacco comment: up to 1/2 ppl x 8 years  Substance and Sexual Activity  . Alcohol use: No  . Drug use: No  . Sexual activity: Yes    Birth control/protection: Surgical  Other Topics Concern  . Not on file  Social History Narrative   Lives with daughter, son and husband in a one story home.  Has 2 children.  Does not work at this time.  Education: GED.    Right-handed.   4-5 twelve ounce cans per day.   Social Determinants of Health   Financial Resource Strain: Not on file  Food Insecurity: Not on file  Transportation Needs: Not on file  Physical Activity: Not on file  Stress: Not on file  Social Connections: Not on file    Allergies:  Allergies  Allergen Reactions  . Hydrocodone-Acetaminophen Hives and Swelling    Hives  . Penicillins Hives    Metabolic Disorder Labs: No results found for: HGBA1C, MPG No results found for: PROLACTIN No results found for: CHOL, TRIG, HDL, CHOLHDL, VLDL, LDLCALC No results found for: TSH  Therapeutic Level Labs: No results found for: LITHIUM No results found for: VALPROATE No components found for:  CBMZ  Current Medications: Current Outpatient Medications  Medication Sig Dispense Refill  . buPROPion (WELLBUTRIN XL) 300 MG 24 hr tablet Take 1 tablet (300 mg total) by mouth daily. 90 tablet 1  . ciprofloxacin-dexamethasone (CIPRODEX)  otic suspension Place 3 drops into the left ear 3 (three) times daily. 7.5 mL 2  . diazepam (VALIUM) 10 MG tablet Take 1 tablet (10 mg total) by mouth every 8 (eight) hours as needed for anxiety. 90 tablet 2  . diclofenac sodium (VOLTAREN) 1 % GEL Apply 4 g topically 4 (four) times daily as needed. 100 g 11  . dicyclomine (BENTYL) 10 MG capsule Take 10 mg by mouth 4 (four) times daily -  before meals and at bedtime.    Marland Kitchen ibuprofen (ADVIL,MOTRIN) 800 MG tablet Take 1 tablet (800 mg total) by mouth every 8 (eight) hours as needed for pain. For pain 30 tablet 2  . lamoTRIgine (LAMICTAL) 200 MG tablet TAKE ONE TABLET BY MOUTH AT BEDTIME. 30 tablet 0  . lurasidone (  LATUDA) 40 MG TABS tablet Take 1 tablet (40 mg total) by mouth daily with supper. 30 tablet 2  . meloxicam (MOBIC) 15 MG tablet Take 1 tablet (15 mg total) by mouth daily. One tab after meal 30 tablet 11  . Multiple Vitamin (MULTIVITAMIN WITH MINERALS) TABS Take 1 tablet by mouth daily.    . pantoprazole (PROTONIX) 40 MG tablet Take 1 tablet (40 mg total) by mouth daily. 30 tablet 0  . polyethylene glycol (MIRALAX / GLYCOLAX) packet Take 17 g by mouth daily.    . promethazine (PHENERGAN) 25 MG tablet Take 1 tablet (25 mg total) by mouth 3 (three) times daily as needed for nausea or vomiting. 90 tablet 1  . sucralfate (CARAFATE) 1 g tablet Take 1 tablet (1 g total) by mouth 4 (four) times daily -  with meals and at bedtime. 30 tablet 0   No current facility-administered medications for this visit.     Psychiatric Specialty Exam: Please take into account limitations in obtaining a full mental status in the context of phone communication Review of Systems  Constitutional: Positive for appetite change.  Psychiatric/Behavioral: Positive for sleep disturbance. The patient is nervous/anxious.   All other systems reviewed and are negative.   Last menstrual period 08/25/2012.There is no height or weight on file to calculate BMI.  General  Appearance: NA  Eye Contact:  NA  Speech:  Clear and Coherent  Volume:  Normal  Mood:  Reports she is still feeling depressed, anxious   Affect:  NA  Thought Process:  Goal Directed  Orientation:  Full (Time, Place, and Person)  Thought Content: reports she still ruminates about the death of her sister , who passed away last year.Denies hallucinations, no delusions are expressed   Suicidal Thoughts:  No she denies any suicidal or self injurious ideations, no HI   Homicidal Thoughts:  No  Memory:  Grossly intact   Judgement:  Good  Insight:  Fair  Psychomotor Activity:  NA  Concentration:  Concentration: Fair  Recall:  Good  Fund of Knowledge: Good  Language: Good  Akathisia:  Negative  Handed:  Right  AIMS (if indicated): not done  Assets:  Communication Skills Desire for Improvement Financial Resources/Insurance Housing Social Support Talents/Skills  ADL's:  Intact  Cognition: WNL  Sleep:  Fair    Assessment and Plan: 44yo female with a history of chronic depression and anxiety.  She has been diagnosed with MDD and GAD.  Her sister passed away last year resulting in exacerbation of depression and she reports she is still grieving her loss.  Denies suicidal or self-injurious ideations denies any psychotic symptoms.  At this time she is on Wellbutrin XL which she feels has helped more than other previous antidepressant trials, Latuda, Lamictal, Valium PRN.  No medication side effects reported at this time  Dx: MDDwithmixedfeatures(vsbipolardisorder, unspecified,depressed); GAD ; Bereavement  Plan: Will continue current regimen for now.  Reviewed medication list .  Continue Lamictal 200 mg nightly Continue Valium 10 mg every 8 hours as needed Continue Wellbutrin XL 300 mg daily Continue Latuda 40 mg daily Side effects have been reviewed Patient has expressed interest in IOP referral.  Have reviewed with clinic staff to initiate referral process Next appointment  in 3 to 4 weeks.  Patient agrees to contact clinic sooner should there be any worsening or any medication concerns   Craige Cotta, MD 11/21/2020, 9:34 AM

## 2020-11-22 ENCOUNTER — Encounter (HOSPITAL_COMMUNITY): Payer: Self-pay | Admitting: Psychiatry

## 2020-11-22 ENCOUNTER — Telehealth (HOSPITAL_COMMUNITY): Payer: Self-pay | Admitting: Psychiatry

## 2020-11-22 MED ORDER — LAMOTRIGINE 200 MG PO TABS: 200.0000 mg | ORAL_TABLET | Freq: Every day | ORAL | 0 refills | Status: DC

## 2020-11-22 MED ORDER — BUPROPION HCL ER (XL) 300 MG PO TB24: 300.0000 mg | ORAL_TABLET | Freq: Every day | ORAL | 0 refills | Status: DC

## 2020-12-15 ENCOUNTER — Other Ambulatory Visit (HOSPITAL_COMMUNITY): Payer: Self-pay | Admitting: Psychiatry

## 2020-12-20 ENCOUNTER — Telehealth (INDEPENDENT_AMBULATORY_CARE_PROVIDER_SITE_OTHER): Payer: BC Managed Care – PPO | Admitting: Psychiatry

## 2020-12-20 ENCOUNTER — Encounter (HOSPITAL_COMMUNITY): Payer: Self-pay | Admitting: Psychiatry

## 2020-12-20 ENCOUNTER — Other Ambulatory Visit: Payer: Self-pay

## 2020-12-20 DIAGNOSIS — F3341 Major depressive disorder, recurrent, in partial remission: Secondary | ICD-10-CM

## 2020-12-20 MED ORDER — LAMOTRIGINE 200 MG PO TABS: 200.0000 mg | ORAL_TABLET | Freq: Every day | ORAL | 1 refills | Status: DC

## 2020-12-20 MED ORDER — LURASIDONE HCL 40 MG PO TABS: 40.0000 mg | ORAL_TABLET | Freq: Every day | ORAL | 1 refills | Status: DC

## 2020-12-20 MED ORDER — BUPROPION HCL ER (XL) 300 MG PO TB24: 300.0000 mg | ORAL_TABLET | Freq: Every day | ORAL | 1 refills | Status: DC

## 2020-12-20 MED ORDER — DIAZEPAM 10 MG PO TABS: 10.0000 mg | ORAL_TABLET | Freq: Three times a day (TID) | ORAL | 1 refills | Status: DC | PRN

## 2020-12-20 NOTE — Progress Notes (Signed)
BH MD/PA/NP OP Progress Note  12/20/2020 8:43 AM Monica Ballard  MRN:  [id number] Interview was conducted by phone and I verified that I was speaking with the correct person using two identifiers. I discussed the limitations of evaluation and management by telemedicine and  the availability of in person appointments. Patient expressed understanding and agreed to proceed. Participants in the visit: patient (location - home); physician (location - clinic office). Duration 25 minutes  Chief Complaint: Anxiety, depression.  HPI: 45yo married white female initially referred by her PCP for treatment of chronic depression and anxiety resistant to treatment. Reported history of chronic anxiety, depression over a period of years, with several antidepressant trials in the past that she did not feel worked.  She has no hx of mania, psychosis, alcohol or drug abuse.   Currently reports partially improved mood . She does continue to experience some symptoms to include a vague sense of anhedonia and low energy, but overall reports she is feeling better. She reports her family ( lives with her husband and adult son) are very supportive.  Today  reports she continues to experience some depression and anxiety, although characterizes her mood as partially improved and states she feels she is gradually feeling better. No psychotic symptoms are noted or reported . Denies any suicidal ideations and presents future oriented .  Denies medication side effects at this time We reviewed medication side effect profile to include potential risk of severe rash on Lamotrigine , risk of motor and metabolic side effects on Latuda, and risk of seizures on Wellbutrin. ( She denies history of seizure disorder ). We also reviewed sedation and abuse potential associated with BZD. She expresses awareness and states that she is taking as prescribed . Of note, reports long term BZD management and states that prior to Valium had been taking  other BZD ( Klonopin) for years . Continues to experience sadness related to death of sister last year , but feels she is gradually adjusting to loss .    Visit Diagnosis:  No diagnosis found.  Past Psychiatric History: Please see intake H&P.  Past Medical History:  Past Medical History:  Diagnosis Date  . Anemia   . Anxiety   . Asthma    smoker-uses nebulizer-not used in last year, albuterol rescue inhaler used in 11/13 preoperatively  . Chronic bronchitis (HCC)    nebulizer and rescue inhaler  . Depression   . GERD (gastroesophageal reflux disease)    occasional -tums  . Headache(784.0)   . Hearing loss in left ear   . Hypercholesteremia   . Hyperlipidemia   . Missed ab 01/09/2012   10weeks-used cytotec  . Numbness and tingling   . PONV (postoperative nausea and vomiting)   . Preterm labor   . Teeth decayed    lower , nothing loose-encouraged pt to seek dental care if necessary    Past Surgical History:  Procedure Laterality Date  . CHOLECYSTECTOMY, LAPAROSCOPIC  12/06/2010  . DILATION AND CURETTAGE OF UTERUS  06/11/2012   Procedure: DILATATION AND CURETTAGE;  Surgeon: Geryl Rankins, MD;  Location: [facility];  Service: Gynecology;  Laterality: N/A;  sec procedure start  1241  . DILATION AND EVACUATION  01/16/2012   Procedure: DILATATION AND EVACUATION;  Surgeon: Geryl Rankins, MD;  Location: [facility];  Service: Gynecology;  Laterality: N/A;  . LAPAROSCOPY  06/11/2012   Procedure: LAPAROSCOPY OPERATIVE;  Surgeon: Geryl Rankins, MD;  Location: [facility];  Service: Gynecology;  Laterality: N/A;  . left  ear drum rebuilt     x2  . TYMPANOPLASTY Left 04/23/2016   Procedure: TYMPANOPLASTY LEFT EAR;  Surgeon: Serena Colonel, MD;  Location: [facility];  Service: ENT;  Laterality: Left;  Marland Kitchen VAGINAL HYSTERECTOMY N/A 09/01/2012   Procedure: HYSTERECTOMY VAGINAL;  Surgeon: Geryl Rankins, MD;  Location: [facility];  Service: Gynecology;  Laterality: N/A;    Family Psychiatric  History: Reviewed.  Family History:  Family History  Problem Relation Age of Onset  . Cancer Other        family hx  . Diabetes Other        family hx  . Depression Other        family hx   . Coronary artery disease Mother        also MVD  . Hyperlipidemia Mother   . Hypertension Mother   . Hypertension Father   . Diabetes Father   . Diabetes Maternal Grandmother   . Colon cancer Paternal Grandmother   . Breast cancer Maternal Aunt   . Anesthesia problems Neg Hx     Social History:  Social History   Socioeconomic History  . Marital status: Married    Spouse name: Not on file  . Number of children: 2  . Years of education: GED  . Highest education level: Not on file  Occupational History  . Occupation: Housewife  Tobacco Use  . Smoking status: Former Smoker    Packs/day: 0.25    Years: 8.00    Pack years: 2.00    Types: Cigarettes  . Smokeless tobacco: Never Used  . Tobacco comment: up to 1/2 ppl x 8 years  Substance and Sexual Activity  . Alcohol use: No  . Drug use: No  . Sexual activity: Yes    Birth control/protection: Surgical  Other Topics Concern  . Not on file  Social History Narrative   Lives with daughter, son and husband in a one story home.  Has 2 children.  Does not work at this time.  Education: GED.    Right-handed.   4-5 twelve ounce cans per day.   Social Determinants of Health   Financial Resource Strain: Not on file  Food Insecurity: Not on file  Transportation Needs: Not on file  Physical Activity: Not on file  Stress: Not on file  Social Connections: Not on file    Allergies:  Allergies  Allergen Reactions  . Hydrocodone-Acetaminophen Hives and Swelling    Hives  . Penicillins Hives    Metabolic Disorder Labs: No results found for: HGBA1C, MPG No results found for: PROLACTIN No results found for: CHOL, TRIG, HDL, CHOLHDL, VLDL, LDLCALC No results found for: TSH  Therapeutic Level Labs: No results found for: LITHIUM No  results found for: VALPROATE No components found for:  CBMZ  Current Medications: Current Outpatient Medications  Medication Sig Dispense Refill  . buPROPion (WELLBUTRIN XL) 300 MG 24 hr tablet Take 1 tablet (300 mg total) by mouth daily. 30 tablet 0  . ciprofloxacin-dexamethasone (CIPRODEX) otic suspension Place 3 drops into the left ear 3 (three) times daily. 7.5 mL 2  . diazepam (VALIUM) 10 MG tablet Take 1 tablet (10 mg total) by mouth every 8 (eight) hours as needed for anxiety. 90 tablet 2  . diclofenac sodium (VOLTAREN) 1 % GEL Apply 4 g topically 4 (four) times daily as needed. 100 g 11  . dicyclomine (BENTYL) 10 MG capsule Take 10 mg by mouth 4 (four) times daily -  before meals  and at bedtime.    Marland Kitchen ibuprofen (ADVIL,MOTRIN) 800 MG tablet Take 1 tablet (800 mg total) by mouth every 8 (eight) hours as needed for pain. For pain 30 tablet 2  . lamoTRIgine (LAMICTAL) 200 MG tablet Take 1 tablet (200 mg total) by mouth at bedtime. 30 tablet 0  . lurasidone (LATUDA) 40 MG TABS tablet Take 1 tablet (40 mg total) by mouth daily with supper. 30 tablet 2  . meloxicam (MOBIC) 15 MG tablet Take 1 tablet (15 mg total) by mouth daily. One tab after meal 30 tablet 11  . Multiple Vitamin (MULTIVITAMIN WITH MINERALS) TABS Take 1 tablet by mouth daily.    . pantoprazole (PROTONIX) 40 MG tablet Take 1 tablet (40 mg total) by mouth daily. 30 tablet 0  . polyethylene glycol (MIRALAX / GLYCOLAX) packet Take 17 g by mouth daily.    . promethazine (PHENERGAN) 25 MG tablet Take 1 tablet (25 mg total) by mouth 3 (three) times daily as needed for nausea or vomiting. 90 tablet 1  . sucralfate (CARAFATE) 1 g tablet Take 1 tablet (1 g total) by mouth 4 (four) times daily -  with meals and at bedtime. 30 tablet 0   No current facility-administered medications for this visit.     Psychiatric Specialty Exam: Please take into account limitations in obtaining a full mental status in the context of phone  communication Review of Systems  Constitutional: Positive for appetite change.  Psychiatric/Behavioral: Positive for sleep disturbance. The patient is nervous/anxious.   All other systems reviewed and are negative.   Last menstrual period 08/25/2012.There is no height or weight on file to calculate BMI.  General Appearance: NA  Eye Contact:  NA  Speech:  Clear and Coherent  Volume:  Normal  Mood:  Reports  lingering depression, but overall gradually improving mood   , describes mood as 7/10 withy 10 being best   Affect:  NA  Thought Process:  Goal Directed  Orientation:  Full (Time, Place, and Person)  Thought Content: no suicidal ideations, no HI , no hallucinations, no delusions expressed   Suicidal Thoughts:  No she denies any suicidal or self injurious ideations, no HI   Homicidal Thoughts:  No  Memory:  Grossly intact   Judgement:  Good  Insight:  Fair  Psychomotor Activity:  NA  Concentration: grossly intact   Recall:  Good  Fund of Knowledge: Good  Language: Good  Akathisia:  Negative  Handed:  Right  AIMS (if indicated): not done  Assets:  Communication Skills Desire for Improvement Financial Resources/Insurance Housing Social Support Talents/Skills  ADL's:  Intact  Cognition: WNL  Sleep:  Fair    Assessment and Plan: 44yo female with a history of chronic depression and anxiety.  She has been diagnosed with MDD and GAD.  Her sister passed away last year resulting in exacerbation of depression .   Currently she reports gradual, partial improvement compared to prior. She does endorse some lingering anhedonia. Continues to grieve loss of sister but no symptoms of complicated bereavement noted or reported at this time. Denies medication side effects. Have reviewed side effect profile, to include risk of severe rash on Lamictal, risk of seizure on Wellbutrin, risk of metabolic /motor side effects related to [place]. She is aware of potential sedating and abuse potential  associated with BZDs . States has been on BZDs for years without untoward effects .   Dx: MDDwithmixedfeatures(vsbipolardisorder, unspecified,depressed); GAD ; Bereavement  Plan: Will continue current regimen for  now. .  Continue Lamictal 200 mg daily Continue Valium 10 mg every 8 hours as needed Continue Wellbutrin XL 300 mg daily Continue Latuda 40 mg daily Next appointment in about 4 weeks.  We reviewed obtaining HgbA1C, lipid panel ( routine as on Latuda) via PCP  Patient agrees to contact clinic sooner should there be any worsening or any medication concerns   Craige CottaFernando A Stellah Donovan, MD 12/20/2020, 8:43 AM

## 2021-01-19 ENCOUNTER — Other Ambulatory Visit (HOSPITAL_COMMUNITY): Payer: Self-pay | Admitting: Psychiatry

## 2021-02-13 ENCOUNTER — Encounter (HOSPITAL_COMMUNITY): Payer: Self-pay | Admitting: Psychiatry

## 2021-02-13 ENCOUNTER — Other Ambulatory Visit: Payer: Self-pay

## 2021-02-13 ENCOUNTER — Telehealth (INDEPENDENT_AMBULATORY_CARE_PROVIDER_SITE_OTHER): Payer: No Typology Code available for payment source | Admitting: Psychiatry

## 2021-02-13 DIAGNOSIS — F3341 Major depressive disorder, recurrent, in partial remission: Secondary | ICD-10-CM | POA: Diagnosis not present

## 2021-02-13 MED ORDER — LAMOTRIGINE 200 MG PO TABS: 200.0000 mg | ORAL_TABLET | Freq: Every day | ORAL | 1 refills | Status: DC

## 2021-02-13 MED ORDER — DIAZEPAM 10 MG PO TABS: 10.0000 mg | ORAL_TABLET | Freq: Three times a day (TID) | ORAL | 1 refills | Status: DC | PRN

## 2021-02-13 MED ORDER — LURASIDONE HCL 20 MG PO TABS: 20.0000 mg | ORAL_TABLET | Freq: Every day | ORAL | 1 refills | Status: DC

## 2021-02-13 MED ORDER — BUPROPION HCL ER (XL) 300 MG PO TB24: 300.0000 mg | ORAL_TABLET | Freq: Every day | ORAL | 1 refills | Status: DC

## 2021-02-13 NOTE — Progress Notes (Signed)
BH MD/PA/NP OP Progress Note  02/13/2021 8:26 AM Monica Ballard  MRN:  [id number] Interview was conducted by phone and I verified that I was speaking with the correct person using two identifiers. I discussed the limitations of evaluation and management by telemedicine and  the availability of in person appointments.  Participants in the visit:  patient (location - home);  physician (location - clinic office). Duration 25 minutes  Chief Complaint:   Returns for medication management appointment  HPI: 45 yo married white female initially referred by her PCP for treatment of chronic depression and anxiety resistant to treatment. Reported history of chronic anxiety, depression over a period of years, with several antidepressant trials in the past that she did not feel worked.  She has no hx of mania, psychosis, alcohol or drug abuse.   Monica Ballard reports that in general she has been doing "all right".  She describes a stable and supportive family life. She describes her mood as stable and does not currently endorse significant depression or neurovegetative symptoms.  Denies having any suicidal ideations.  No psychotic symptoms endorsed.  She does continue to grieve the loss of her sister who passed away last year and of other loved ones/family also have passed.  She endorses  some persistent anxiety.  Describes a tendency to " worry", and also having increased anxiety/panic symptoms particularly in crowded situations.  As an example states she and her family recently went to a water park which was crowded and she had to leave early has felt anxious.   With regards to medications: States she feels current medication regimen has been effective and generally well-tolerated.  Does not endorse side effects other than possibly some weight gain.  She has noted some weight gain of about 15 pounds since last year, which she feels may be related to Jordan.  We reviewed history.  Patient describes mainly a  history of chronic anxiety and depression.  She has had brief episodes of subjective mood instability/increased energy usually lasting only a couple of hours.  At this time does not endorse any clear history of mania/hypomania.  She does describe that her mood has been more stable on current medication regimen.  She reports she recently went to her PCP and was told that vitals were normal, work-up was unremarkable.  She returns to her PCP for follow-up appointment soon  We reviewed side effects to include risk of seizures on Wellbutrin (denies any history of seizures), severe rash on lamotrigine, sedation/abuse potential associated with benzodiazepines, potential risk of movement and metabolic disorders on Latuda.     Visit Diagnosis:  No diagnosis found.  Past Psychiatric History: Please see intake H&P.  Past Medical History:  Past Medical History:  Diagnosis Date   Anemia    Anxiety    Asthma    smoker-uses nebulizer-not used in last year, albuterol rescue inhaler used in 11/13 preoperatively   Chronic bronchitis (HCC)    nebulizer and rescue inhaler   Depression    GERD (gastroesophageal reflux disease)    occasional -tums   Headache(784.0)    Hearing loss in left ear    Hypercholesteremia    Hyperlipidemia    Missed ab 01/09/2012   10weeks-used cytotec   Numbness and tingling    PONV (postoperative nausea and vomiting)    Preterm labor    Teeth decayed    lower , nothing loose-encouraged pt to seek dental care if necessary    Past Surgical History:  Procedure Laterality  Date   CHOLECYSTECTOMY, LAPAROSCOPIC  12/06/2010   DILATION AND CURETTAGE OF UTERUS  06/11/2012   Procedure: DILATATION AND CURETTAGE;  Surgeon: Geryl RankinsEvelyn Varnado, MD;  Location: [facility];  Service: Gynecology;  Laterality: N/A;  sec procedure start  1241   DILATION AND EVACUATION  01/16/2012   Procedure: DILATATION AND EVACUATION;  Surgeon: Geryl RankinsEvelyn Varnado, MD;  Location: [facility];  Service: Gynecology;   Laterality: N/A;   LAPAROSCOPY  06/11/2012   Procedure: LAPAROSCOPY OPERATIVE;  Surgeon: Geryl RankinsEvelyn Varnado, MD;  Location: [facility];  Service: Gynecology;  Laterality: N/A;   left ear drum rebuilt     x2   TYMPANOPLASTY Left 04/23/2016   Procedure: TYMPANOPLASTY LEFT EAR;  Surgeon: Serena ColonelJefry Rosen, MD;  Location: [facility];  Service: ENT;  Laterality: Left;   VAGINAL HYSTERECTOMY N/A 09/01/2012   Procedure: HYSTERECTOMY VAGINAL;  Surgeon: Geryl RankinsEvelyn Varnado, MD;  Location: [facility];  Service: Gynecology;  Laterality: N/A;    Family Psychiatric History: Reviewed.  Family History:  Family History  Problem Relation Age of Onset   Cancer Other        family hx   Diabetes Other        family hx   Depression Other        family hx    Coronary artery disease Mother        also MVD   Hyperlipidemia Mother    Hypertension Mother    Hypertension Father    Diabetes Father    Diabetes Maternal Grandmother    Colon cancer Paternal Grandmother    Breast cancer Maternal Aunt    Anesthesia problems Neg Hx     Social History:  Social History   Socioeconomic History   Marital status: Married    Spouse name: Not on file   Number of children: 2   Years of education: GED   Highest education level: Not on file  Occupational History   Occupation: Housewife  Tobacco Use   Smoking status: Former    Packs/day: 0.25    Years: 8.00    Pack years: 2.00    Types: Cigarettes   Smokeless tobacco: Never   Tobacco comments:    up to 1/2 ppl x 8 years  Substance and Sexual Activity   Alcohol use: No   Drug use: No   Sexual activity: Yes    Birth control/protection: Surgical  Other Topics Concern   Not on file  Social History Narrative   Lives with daughter, son and husband in a one story home.  Has 2 children.  Does not work at this time.  Education: GED.    Right-handed.   4-5 twelve ounce cans per day.   Social Determinants of Health   Financial Resource Strain: Not on file  Food  Insecurity: Not on file  Transportation Needs: Not on file  Physical Activity: Not on file  Stress: Not on file  Social Connections: Not on file    Allergies:  Allergies  Allergen Reactions   Hydrocodone-Acetaminophen Hives and Swelling    Hives   Penicillins Hives    Metabolic Disorder Labs: No results found for: HGBA1C, MPG No results found for: PROLACTIN No results found for: CHOL, TRIG, HDL, CHOLHDL, VLDL, LDLCALC No results found for: TSH  Therapeutic Level Labs: No results found for: LITHIUM No results found for: VALPROATE No components found for:  CBMZ  Current Medications: Current Outpatient Medications  Medication Sig Dispense Refill   buPROPion (WELLBUTRIN XL) 300 MG 24 hr  tablet Take 1 tablet (300 mg total) by mouth daily. 30 tablet 1   ciprofloxacin-dexamethasone (CIPRODEX) otic suspension Place 3 drops into the left ear 3 (three) times daily. 7.5 mL 2   diazepam (VALIUM) 10 MG tablet Take 1 tablet (10 mg total) by mouth every 8 (eight) hours as needed for anxiety. 90 tablet 1   diclofenac sodium (VOLTAREN) 1 % GEL Apply 4 g topically 4 (four) times daily as needed. 100 g 11   dicyclomine (BENTYL) 10 MG capsule Take 10 mg by mouth 4 (four) times daily -  before meals and at bedtime.     ibuprofen (ADVIL,MOTRIN) 800 MG tablet Take 1 tablet (800 mg total) by mouth every 8 (eight) hours as needed for pain. For pain 30 tablet 2   lamoTRIgine (LAMICTAL) 200 MG tablet Take 1 tablet (200 mg total) by mouth at bedtime. 30 tablet 1   lurasidone (LATUDA) 40 MG TABS tablet Take 1 tablet (40 mg total) by mouth daily with supper. 30 tablet 1   meloxicam (MOBIC) 15 MG tablet Take 1 tablet (15 mg total) by mouth daily. One tab after meal 30 tablet 11   Multiple Vitamin (MULTIVITAMIN WITH MINERALS) TABS Take 1 tablet by mouth daily.     pantoprazole (PROTONIX) 40 MG tablet Take 1 tablet (40 mg total) by mouth daily. 30 tablet 0   polyethylene glycol (MIRALAX / GLYCOLAX) packet  Take 17 g by mouth daily.     promethazine (PHENERGAN) 25 MG tablet Take 1 tablet (25 mg total) by mouth 3 (three) times daily as needed for nausea or vomiting. 90 tablet 1   sucralfate (CARAFATE) 1 g tablet Take 1 tablet (1 g total) by mouth 4 (four) times daily -  with meals and at bedtime. 30 tablet 0   No current facility-administered medications for this visit.     Psychiatric Specialty Exam: Please take into account limitations in obtaining a full mental status in the context of phone communication Review of Systems  Constitutional:  Positive for appetite change.  Psychiatric/Behavioral:  Positive for sleep disturbance. The patient is nervous/anxious.   All other systems reviewed and are negative.  Last menstrual period 08/25/2012.There is no height or weight on file to calculate BMI.  General Appearance: NA  Eye Contact:  NA  Speech:  Clear and Coherent  Volume:  Normal  Mood: Reports generally doing better.  States mood has been stable.   Affect:  NA  Thought Process:  Goal Directed  Orientation:  Full (Time, Place, and Person)  Thought Content: Denies suicidal ideations or self-injurious ideations no HI , no hallucinations, no delusions expressed   Suicidal Thoughts:  No she denies any suicidal or self injurious ideations, no HI   Homicidal Thoughts:  No  Memory:  Grossly intact   Judgement:  Good  Insight:  Fair  Psychomotor Activity:  NA  Concentration: grossly intact   Recall:  Good  Fund of Knowledge: Good  Language: Good  Akathisia:  Negative  Handed:  Right  AIMS (if indicated): not done  Assets:  Communication Skills Desire for Improvement Financial Resources/Insurance Housing Social Support Talents/Skills  ADL's:  Intact  Cognition: WNL  Sleep:  Fair    Assessment and Plan: 45 yo female with a history of chronic depression and anxiety.  She has been diagnosed with MDD and GAD.    She reports mood has been  stable.  She has functioning well in daily  activities and describes having a supportive family.  She does endorse some persistent anxiety symptoms and describes a tendency to worry and some agoraphobia in crowded public situations.  She is tolerating medications well, although reports has gained about 15 pounds over period of several months which may be related to Jordan. We reviewed medication management.  Discussed options, including switching Latuda to another medication or lowering dose.  At this time prefers the latter option as she states this medication has helped.    Dx: MDD with mixed features (vs bipolar disorder, unspecified, depressed); GAD ; Bereavement   Plan:  Will continue regimen as below- Continue Lamictal 200 mg daily Continue Wellbutrin XL 300 mg daily Decrease Latuda to 20 mg daily Continue Valium 10 mg every 8 hours as needed Next appointment in  4 -6 weeks.  Agrees to contact clinic sooner should to be any worsening or concern prior. Patient also expresses interest in starting individual psychotherapy at our clinic if availability at this time.    Craige Cotta, MD 02/13/2021, 8:26 AM

## 2021-03-23 ENCOUNTER — Other Ambulatory Visit (HOSPITAL_COMMUNITY): Payer: Self-pay | Admitting: Psychiatry

## 2021-03-29 ENCOUNTER — Telehealth (INDEPENDENT_AMBULATORY_CARE_PROVIDER_SITE_OTHER): Payer: No Typology Code available for payment source | Admitting: Psychiatry

## 2021-03-29 ENCOUNTER — Encounter (HOSPITAL_COMMUNITY): Payer: Self-pay | Admitting: Psychiatry

## 2021-03-29 ENCOUNTER — Other Ambulatory Visit: Payer: Self-pay

## 2021-03-29 DIAGNOSIS — F322 Major depressive disorder, single episode, severe without psychotic features: Secondary | ICD-10-CM | POA: Diagnosis not present

## 2021-03-29 MED ORDER — BUPROPION HCL ER (XL) 300 MG PO TB24: 300.0000 mg | ORAL_TABLET | Freq: Every day | ORAL | 0 refills | Status: DC

## 2021-03-29 MED ORDER — DIAZEPAM 10 MG PO TABS: 10.0000 mg | ORAL_TABLET | Freq: Three times a day (TID) | ORAL | 0 refills | Status: DC | PRN

## 2021-03-29 MED ORDER — LAMOTRIGINE 200 MG PO TABS: 200.0000 mg | ORAL_TABLET | Freq: Every day | ORAL | 1 refills | Status: DC

## 2021-03-29 MED ORDER — MIRTAZAPINE 15 MG PO TABS: 7.5000 mg | ORAL_TABLET | Freq: Every day | ORAL | 0 refills | Status: DC

## 2021-03-29 MED ORDER — ARIPIPRAZOLE 5 MG PO TABS
5.0000 mg | ORAL_TABLET | Freq: Every day | ORAL | 0 refills | Status: DC
Start: 2021-03-29 — End: 2021-04-24

## 2021-03-29 NOTE — Progress Notes (Signed)
BH MD/PA/NP OP Progress Note  03/29/2021 8:35 AM TULA SCHRYVER  MRN:  [id number] Interview was conducted by phone and I verified that I was speaking with the correct person using two identifiers. I discussed the limitations of evaluation and management by telemedicine and  the availability of in person appointments.  Participants in the visit:  patient (location - home);  physician (location - clinic office). Duration 25 minutes  Chief Complaint:   Returns for medication management appointment  HPI: 45 yo married white female initially referred by her PCP for treatment of chronic depression and anxiety resistant to treatment. Reported history of chronic anxiety, depression over a period of years, with several antidepressant trials in the past that she did not feel worked.  She has no hx of mania, psychosis, alcohol or drug abuse.   Patient reports she has been more anxious and depressed recently. Describes frequently feeling vaguely anxious and having a subjective feeling of apprehension as if " something bad might happen".  She also reports she has been feeling sad, and describes some anhedonia She denies suicidal or self-injurious ideations and remains future oriented.   She identifies love for her husband/children as protective factor She denies hallucinations/psychotic symptoms. Her sister's first anniversary of death is coming up later this month and this may be partially contributing to increased symptoms.  She reports adequate medication compliance and does not report significant side effects, but she feels medications are no longer working as well for her . Reports she recently went to her PCP, states she was found to have slightly elevated cholesterol otherwise was told she was doing well .   We reviewed medication management options: would rather not titrate Wellbutrin XL dose further because it could worsen anxiety /panic type symptoms that she has been experiencing and because of  buproprion potential to increase BP ( states BP was slightly higher than usual recently). Agreed to switch from Jordan , which she feels has not been working as well and has contributed to weight gain, to Abilify, which she has not been on in the past, for antidepressant augmentation.  She remembers having been on Celexa , Prozac ,Zoloft years ago- does not feel those medications helped. She does agree to Remeron trial as antidepressant augmentation trial and to help sleep which has been sub-optimal. Side effects reviewed, to include risk of sedation, weight gain on Remeron, risk of movement disorder, akathisia, movement / metabolic disorder and remote risk of NMS on Abilify.  Reviewed potential risk of serotonin syndrome      Visit Diagnosis:  No diagnosis found.  Past Psychiatric History: Please see intake H&P.  Past Medical History:  Past Medical History:  Diagnosis Date   Anemia    Anxiety    Asthma    smoker-uses nebulizer-not used in last year, albuterol rescue inhaler used in 11/13 preoperatively   Chronic bronchitis (HCC)    nebulizer and rescue inhaler   Depression    GERD (gastroesophageal reflux disease)    occasional -tums   Headache(784.0)    Hearing loss in left ear    Hypercholesteremia    Hyperlipidemia    Missed ab 01/09/2012   10weeks-used cytotec   Numbness and tingling    PONV (postoperative nausea and vomiting)    Preterm labor    Teeth decayed    lower , nothing loose-encouraged pt to seek dental care if necessary    Past Surgical History:  Procedure Laterality Date   CHOLECYSTECTOMY, LAPAROSCOPIC  12/06/2010  DILATION AND CURETTAGE OF UTERUS  06/11/2012   Procedure: DILATATION AND CURETTAGE;  Surgeon: Geryl Rankins, MD;  Location: [facility];  Service: Gynecology;  Laterality: N/A;  sec procedure start  1241   DILATION AND EVACUATION  01/16/2012   Procedure: DILATATION AND EVACUATION;  Surgeon: Geryl Rankins, MD;  Location: [facility];  Service:  Gynecology;  Laterality: N/A;   LAPAROSCOPY  06/11/2012   Procedure: LAPAROSCOPY OPERATIVE;  Surgeon: Geryl Rankins, MD;  Location: [facility];  Service: Gynecology;  Laterality: N/A;   left ear drum rebuilt     x2   TYMPANOPLASTY Left 04/23/2016   Procedure: TYMPANOPLASTY LEFT EAR;  Surgeon: Serena Colonel, MD;  Location: [facility];  Service: ENT;  Laterality: Left;   VAGINAL HYSTERECTOMY N/A 09/01/2012   Procedure: HYSTERECTOMY VAGINAL;  Surgeon: Geryl Rankins, MD;  Location: [facility];  Service: Gynecology;  Laterality: N/A;    Family Psychiatric History: Reviewed.  Family History:  Family History  Problem Relation Age of Onset   Cancer Other        family hx   Diabetes Other        family hx   Depression Other        family hx    Coronary artery disease Mother        also MVD   Hyperlipidemia Mother    Hypertension Mother    Hypertension Father    Diabetes Father    Diabetes Maternal Grandmother    Colon cancer Paternal Grandmother    Breast cancer Maternal Aunt    Anesthesia problems Neg Hx     Social History:  Social History   Socioeconomic History   Marital status: Married    Spouse name: Not on file   Number of children: 2   Years of education: GED   Highest education level: Not on file  Occupational History   Occupation: Housewife  Tobacco Use   Smoking status: Former    Packs/day: 0.25    Years: 8.00    Pack years: 2.00    Types: Cigarettes   Smokeless tobacco: Never   Tobacco comments:    up to 1/2 ppl x 8 years  Substance and Sexual Activity   Alcohol use: No   Drug use: No   Sexual activity: Yes    Birth control/protection: Surgical  Other Topics Concern   Not on file  Social History Narrative   Lives with daughter, son and husband in a one story home.  Has 2 children.  Does not work at this time.  Education: GED.    Right-handed.   4-5 twelve ounce cans per day.   Social Determinants of Health   Financial Resource Strain: Not on  file  Food Insecurity: Not on file  Transportation Needs: Not on file  Physical Activity: Not on file  Stress: Not on file  Social Connections: Not on file    Allergies:  Allergies  Allergen Reactions   Hydrocodone-Acetaminophen Hives and Swelling    Hives   Penicillins Hives    Metabolic Disorder Labs: No results found for: HGBA1C, MPG No results found for: PROLACTIN No results found for: CHOL, TRIG, HDL, CHOLHDL, VLDL, LDLCALC No results found for: TSH  Therapeutic Level Labs: No results found for: LITHIUM No results found for: VALPROATE No components found for:  CBMZ  Current Medications: Current Outpatient Medications  Medication Sig Dispense Refill   buPROPion (WELLBUTRIN XL) 300 MG 24 hr tablet Take 1 tablet (300 mg total) by mouth  daily. 30 tablet 1   ciprofloxacin-dexamethasone (CIPRODEX) otic suspension Place 3 drops into the left ear 3 (three) times daily. 7.5 mL 2   diazepam (VALIUM) 10 MG tablet Take 1 tablet (10 mg total) by mouth every 8 (eight) hours as needed for anxiety. 90 tablet 1   diclofenac sodium (VOLTAREN) 1 % GEL Apply 4 g topically 4 (four) times daily as needed. 100 g 11   dicyclomine (BENTYL) 10 MG capsule Take 10 mg by mouth 4 (four) times daily -  before meals and at bedtime.     ibuprofen (ADVIL,MOTRIN) 800 MG tablet Take 1 tablet (800 mg total) by mouth every 8 (eight) hours as needed for pain. For pain 30 tablet 2   lamoTRIgine (LAMICTAL) 200 MG tablet Take 1 tablet (200 mg total) by mouth at bedtime. 30 tablet 1   lurasidone (LATUDA) 20 MG TABS tablet Take 1 tablet (20 mg total) by mouth daily. 30 tablet 1   meloxicam (MOBIC) 15 MG tablet Take 1 tablet (15 mg total) by mouth daily. One tab after meal 30 tablet 11   Multiple Vitamin (MULTIVITAMIN WITH MINERALS) TABS Take 1 tablet by mouth daily.     pantoprazole (PROTONIX) 40 MG tablet Take 1 tablet (40 mg total) by mouth daily. 30 tablet 0   polyethylene glycol (MIRALAX / GLYCOLAX) packet  Take 17 g by mouth daily.     promethazine (PHENERGAN) 25 MG tablet Take 1 tablet (25 mg total) by mouth 3 (three) times daily as needed for nausea or vomiting. 90 tablet 1   sucralfate (CARAFATE) 1 g tablet Take 1 tablet (1 g total) by mouth 4 (four) times daily -  with meals and at bedtime. 30 tablet 0   No current facility-administered medications for this visit.     Psychiatric Specialty Exam: Please take into account limitations in obtaining a full mental status in the context of phone communication Review of Systems  Constitutional:  Positive for appetite change.  Psychiatric/Behavioral:  Positive for sleep disturbance. The patient is nervous/anxious.   All other systems reviewed and are negative.  Last menstrual period 08/25/2012.There is no height or weight on file to calculate BMI.  General Appearance: NA  Eye Contact:  NA  Speech:  Clear and Coherent  Volume:  Normal  Mood: reports has been feeling more depressed and anxious recently   Affect:  briefly tearful during session  Thought Process:  Goal Directed  Orientation:  Full (Time, Place, and Person)  Thought Content: Denies suicidal ideations or self-injurious thoughts, no HI , no hallucinations, no delusions expressed   Suicidal Thoughts:  No she denies any suicidal or self injurious ideations, no HI   Homicidal Thoughts:  No  Memory:  Grossly intact   Judgement:  Good  Insight:  Fair  Psychomotor Activity:  NA  Concentration: grossly intact   Recall:  Good  Fund of Knowledge: Good  Language: Good  Akathisia:  Negative  Handed:  Right  AIMS (if indicated): not done  Assets:  Communication Skills Desire for Improvement Financial Resources/Insurance Housing Social Support Talents/Skills  ADL's:  Intact  Cognition: WNL  Sleep:  Fair    Assessment and Plan: 45 yo female with a history of chronic depression and anxiety.  She has been diagnosed with MDD and GAD.    Reports she has been feeling more depressed  over recent weeks, and also endorses associated increased anxiety, with an increased tendency to worry and a subjective sense of apprehension and impending  doom . She has been compliant with medications and denies side effects but feels they are no longer working well for her.  We discussed options- decided not to titrate Wellbutrin dose further as could negatively impact anxiety symptoms and as is worried about negative effect of her BP. Agrees to augmentation trial with Remeron.     Dx: MDD with mixed features (vs bipolar disorder, unspecified, depressed); GAD ; Bereavement   Plan:  Will continue regimen as below- Continue Lamictal 200 mg daily Continue Wellbutrin XL 300 mg daily Discontinue Latuda Start Abilify 5 mgr QDAY for antidepressant augmentation Start Remeron 7.5 mgr QHS  Continue Valium 10 mg every 8 hours as needed Next appointment in  4  weeks . Agrees to contact clinic sooner should to be any worsening or concern prior.Agrees to go to ED if SI emerge or needs acute treatment  Have encouraged individual psychotherapy, and also might benefit from referral to IOP .     Craige Cotta, MD 03/29/2021, 8:35 AM

## 2021-03-30 ENCOUNTER — Telehealth (HOSPITAL_COMMUNITY): Payer: Self-pay | Admitting: Psychiatry

## 2021-03-30 NOTE — Telephone Encounter (Signed)
D:  Dr. Jama Flavors referred pt to [facility].  A:  Placed call to orient pt and provide her with a start date, but pt declined stating she would like to see a therapist first.  Transferred pt to the front desk.

## 2021-04-04 ENCOUNTER — Other Ambulatory Visit: Payer: Self-pay

## 2021-04-04 ENCOUNTER — Ambulatory Visit (INDEPENDENT_AMBULATORY_CARE_PROVIDER_SITE_OTHER): Payer: No Typology Code available for payment source | Admitting: Clinical

## 2021-04-04 DIAGNOSIS — F322 Major depressive disorder, single episode, severe without psychotic features: Secondary | ICD-10-CM | POA: Diagnosis not present

## 2021-04-04 DIAGNOSIS — F411 Generalized anxiety disorder: Secondary | ICD-10-CM
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
## 2021-04-12 ENCOUNTER — Ambulatory Visit (INDEPENDENT_AMBULATORY_CARE_PROVIDER_SITE_OTHER): Payer: No Typology Code available for payment source | Admitting: Clinical

## 2021-04-12 ENCOUNTER — Other Ambulatory Visit: Payer: Self-pay

## 2021-04-12 DIAGNOSIS — F322 Major depressive disorder, single episode, severe without psychotic features: Secondary | ICD-10-CM

## 2021-04-12 DIAGNOSIS — F411 Generalized anxiety disorder: Secondary | ICD-10-CM | POA: Diagnosis not present

## 2021-04-12 NOTE — Progress Notes (Signed)
   THERAPIST PROGRESS NOTE  Session Time: 11am  Participation Level: Active  Behavioral Response: NAAlert"Up and down"  tearful at times  Type of Therapy: Individual Therapy  Treatment Goals addressed: Coping  Interventions: Other: psychoeducation Virtual Visit via Telephone Note  I connected with Monica Ballard on 04/12/21 at 11:00 AM EDT by telephone and verified that I am speaking with the correct person using two identifiers.  Location: Patient: home Provider: office   I discussed the limitations, risks, security and privacy concerns of performing an evaluation and management service by telephone and the availability of in person appointments. I also discussed with the patient that there may be a patient responsible charge related to this service. The patient expressed understanding and agreed to proceed.   I discussed the assessment and treatment plan with the patient. The patient was provided an opportunity to ask questions and all were answered. The patient agreed with the plan and demonstrated an understanding of the instructions.   The patient was advised to call back or seek an in-person evaluation if the symptoms worsen or if the condition fails to improve as anticipated.  I provided 47 minutes of non-face-to-face time during this encounter.  Summary: Pt states recent death anniversary of her deceased sister. Pt says her mood has been "up and down" Pt rates mood 4/10(10=improved mood). Pt endorses the following sxs; irritability, agitation, racing thoughts, fatigue, decreased appetite. Pt says her daughter and best friend are positive supports and encourage her to find healthy ways to manage stressors. Pt reports she finds staying active beneficial and says she has been in nature for at least 20 minutes multiple days.  Suicidal/Homicidal: Pt denies SI/HI no plan, intent or attempt to harm self or others reported.  Therapist Response: CSW discussed with pt potential benefits  of participating in grief support group and provided her with contact information of [facility] [place], [facility], [email or web address]/. CSW provided information on sxs  of depression and coping skills that can be used to reduce depressive sxs. CSW discussed with pt how gratitude can shift focus toward positive experiences instead of negative experiences. CSW assigned homework assignment- Identify 3 positive experiences from her day for one week, identify why it happened, why it was meaningful, and how can she have more of that positive experience.  Plan: Return again in 1 weeks.  Diagnosis: Axis I: Generalized anxiety disorder Current severe episode of major depressive disorder w/out psychotic features    Axis II: No diagnosis    Suzan Slick, LCSW 04/12/2021

## 2021-04-24 ENCOUNTER — Other Ambulatory Visit (HOSPITAL_COMMUNITY): Payer: Self-pay | Admitting: Psychiatry

## 2021-04-24 ENCOUNTER — Ambulatory Visit (INDEPENDENT_AMBULATORY_CARE_PROVIDER_SITE_OTHER): Payer: No Typology Code available for payment source | Admitting: Clinical

## 2021-04-24 ENCOUNTER — Other Ambulatory Visit: Payer: Self-pay

## 2021-04-24 ENCOUNTER — Encounter (HOSPITAL_COMMUNITY): Payer: Self-pay | Admitting: Psychiatry

## 2021-04-24 DIAGNOSIS — F322 Major depressive disorder, single episode, severe without psychotic features: Secondary | ICD-10-CM

## 2021-04-24 DIAGNOSIS — F411 Generalized anxiety disorder: Secondary | ICD-10-CM

## 2021-04-24 MED ORDER — MIRTAZAPINE 15 MG PO TABS: 7.5000 mg | ORAL_TABLET | Freq: Every day | ORAL | 0 refills | Status: DC

## 2021-04-24 NOTE — Progress Notes (Signed)
Patient ID: Monica Ballard, female   DOB: 01-28-1976, 46 y.o.   MRN: [id number] 04/24/2021 6 PM  Telephone documentation  Patient called office requesting callback I spoke with her via phone She reports that over the last few days she has felt nauseous and has had a headache.  She does report that her husband currently has COVID and has been coughing/sneezing but she has not had any of the symptoms or fever.  I have encouraged her to consider getting tested for COVID.  She also recently started Abilify and Remeron, and above could be related to side effects to new medication.  She does report improving appetite and sleep.  We discussed options and she agrees to stop Abilify and monitor to see if the above reported symptoms improve.  She is aware that if her symptoms become severe she should go to the ED promptly.  She denies suicidal ideations.  She has an upcoming appointment to see me on 10/17 which she intends to keep  She reports she will contact our clinic in 2 or 3 days to let us know how she is feeling  F Keeyon Privitera MD

## 2021-04-24 NOTE — Progress Notes (Signed)
   THERAPIST PROGRESS NOTE  Session Time: 11am  Participation Level: Active  Behavioral Response: NAAlert"aggravated and drained"  Type of Therapy: Individual Therapy  Treatment Goals addressed: Coping  Interventions: Supportive Virtual Visit via Telephone Note  I connected with Monica Ballard on 04/24/21 at 11:00 AM EDT by telephone and verified that I am speaking with the correct person using two identifiers.  Location: Patient: home Provider: office   I discussed the limitations, risks, security and privacy concerns of performing an evaluation and management service by telephone and the availability of in person appointments. I also discussed with the patient that there may be a patient responsible charge related to this service. The patient expressed understanding and agreed to proceed.   I discussed the assessment and treatment plan with the patient. The patient was provided an opportunity to ask questions and all were answered. The patient agreed with the plan and demonstrated an understanding of the instructions.   The patient was advised to call back or seek an in-person evaluation if the symptoms worsen or if the condition fails to improve as anticipated.  I provided 45 minutes of non-face-to-face time during this encounter.  Summary:  Pt describes her mood as "aggravated and drained" Pt states for the past couple of weeks she has noticed she has been more short tempered and agitated. Pt says she is unsure if it is due to her medication or depressive sxs. Pt says she is caring for a sick and husband and trying to balance all of her other responsibilities. Pt states she has not looked at the grief resources previously provided but says she plans to do so. Pt also states she did not complete the homework assignment. Pt demonstrates some insight as she states "I'll be more depressed and anxious if I dont start taking care of me" Pt reports she had the opportunity to spend time with  her daughter, which she says she enjoys.  Suicidal/Homicidal: Pt denies SI/HI no plan, intent or attempt to harm self or others reported.  Therapist Response: CSW reviewed and provided pt with information on healthy coping methods to manage depression and anxiety. Actively listened as pt discussed her exercise regimen, stating she receives at least 20 minutes each day. CSW processed with pt how social support can be a protective factor and leaning on existing relationships to improve resilience to stress and depression. Hw assignment=identify 3 things grateful for each day (why it happened, why its meaningful, how can she have more of the positive experience) Plan: Return again in 1 weeks.  Diagnosis: Axis I: Generalized anxiety disorder Current severe episode of major depressive disorder w/out psychotic features    Axis II: No diagnosis    Suzan Slick, LCSW 04/24/2021

## 2021-04-27 ENCOUNTER — Other Ambulatory Visit (HOSPITAL_COMMUNITY): Payer: Self-pay | Admitting: Psychiatry

## 2021-05-01 ENCOUNTER — Ambulatory Visit (HOSPITAL_COMMUNITY): Payer: No Typology Code available for payment source | Admitting: Clinical

## 2021-05-01 ENCOUNTER — Other Ambulatory Visit: Payer: Self-pay

## 2021-05-07 ENCOUNTER — Other Ambulatory Visit (HOSPITAL_COMMUNITY): Payer: Self-pay | Admitting: Psychiatry

## 2021-05-08 ENCOUNTER — Encounter (HOSPITAL_COMMUNITY): Payer: Self-pay | Admitting: Psychiatry

## 2021-05-08 ENCOUNTER — Other Ambulatory Visit: Payer: Self-pay

## 2021-05-08 ENCOUNTER — Telehealth (HOSPITAL_BASED_OUTPATIENT_CLINIC_OR_DEPARTMENT_OTHER): Payer: No Typology Code available for payment source | Admitting: Psychiatry

## 2021-05-08 ENCOUNTER — Other Ambulatory Visit (HOSPITAL_COMMUNITY): Payer: Self-pay | Admitting: Psychiatry

## 2021-05-08 DIAGNOSIS — F320 Major depressive disorder, single episode, mild: Secondary | ICD-10-CM | POA: Diagnosis not present

## 2021-05-08 MED ORDER — BUPROPION HCL ER (XL) 300 MG PO TB24: 300.0000 mg | ORAL_TABLET | Freq: Every day | ORAL | 1 refills | Status: DC

## 2021-05-08 MED ORDER — LAMOTRIGINE 200 MG PO TABS: 200.0000 mg | ORAL_TABLET | Freq: Every day | ORAL | 1 refills | Status: DC

## 2021-05-08 MED ORDER — MIRTAZAPINE 15 MG PO TABS: 15.0000 mg | ORAL_TABLET | Freq: Every day | ORAL | 1 refills | Status: DC

## 2021-05-08 MED ORDER — DIAZEPAM 10 MG PO TABS: 10.0000 mg | ORAL_TABLET | Freq: Two times a day (BID) | ORAL | 1 refills | Status: DC | PRN

## 2021-05-08 NOTE — Progress Notes (Addendum)
[facility] MD/PA/NP OP Progress Note  05/08/2021 9:59 AM Monica Ballard  MRN:  [id number] Interview was conducted by phone and I verified that I was speaking with the correct person using two identifiers. I discussed the limitations of evaluation and management by telemedicine and  the availability of in person appointments.  Participants in the visit:  patient (location - home);  physician (location - clinic office). Duration 25 minutes  Chief Complaint:   Returns for medication management appointment  HPI: 45 yo married white female initially referred by her PCP for treatment of chronic depression and anxiety resistant to treatment. Reported history of chronic anxiety, depression over a period of years, with several antidepressant trials in the past that she did not feel worked.  She has no hx of mania, psychosis, alcohol or drug abuse.    She reports that  she has continued to experience anxiety and some depression , partly related to her sister's anniversary of death, which was earlier this month. She states she has supportive family and friends who have helped her cope with grief and spent anniversary of sister's death with her. Another recent stressor is that both her and her husband contracted COVID. She reports she felt ill, but received Paxlovid ( has completed course ) and is now doing better .  Describes anxiety as feeling apprehensive , particularly when she leaves her home and is in public/ crowded places . Describes some agoraphobia, stating that she sometimes needs to leave/ go sit in the car while her family is at a store or similar . Describes anxiety as long term, states she has struggled with anxiety for many years . Mood has been "OK", although she does endorse some ongoing sense of sadness . Does not endorse major neuro-vegetative symptoms .  Denies suicidal ideations and presents future oriented . Denies hallucinations /psychotic symptoms and none are noted .  Denies medication  side effects. She had not tolerated Abilify well and is now off it .  Currently on Remeron , which she is tolerating well thus far .  She has also started individual psychotherapy, which she states is going well and appears invested in at present .  We reviewed medication side effects.  Agrees to gradually taper Valium, which she states she has been taking regularly. Denies abusing or misusing .         Visit Diagnosis:  No diagnosis found.  Past Psychiatric History: Please see intake H&P.  Past Medical History:  Past Medical History:  Diagnosis Date   Anemia    Anxiety    Asthma    smoker-uses nebulizer-not used in last year, albuterol rescue inhaler used in 11/13 preoperatively   Chronic bronchitis (HCC)    nebulizer and rescue inhaler   Depression    GERD (gastroesophageal reflux disease)    occasional -tums   Headache(784.0)    Hearing loss in left ear    Hypercholesteremia    Hyperlipidemia    Missed ab 01/09/2012   10weeks-used cytotec   Numbness and tingling    PONV (postoperative nausea and vomiting)    Preterm labor    Teeth decayed    lower , nothing loose-encouraged pt to seek dental care if necessary    Past Surgical History:  Procedure Laterality Date   CHOLECYSTECTOMY, LAPAROSCOPIC  12/06/2010   DILATION AND CURETTAGE OF UTERUS  06/11/2012   Procedure: DILATATION AND CURETTAGE;  Surgeon: Geryl Rankins, MD;  Location: [facility];  Service: Gynecology;  Laterality: N/A;  sec  procedure start  1241   DILATION AND EVACUATION  01/16/2012   Procedure: DILATATION AND EVACUATION;  Surgeon: Geryl Rankins, MD;  Location: [facility];  Service: Gynecology;  Laterality: N/A;   LAPAROSCOPY  06/11/2012   Procedure: LAPAROSCOPY OPERATIVE;  Surgeon: Geryl Rankins, MD;  Location: [facility];  Service: Gynecology;  Laterality: N/A;   left ear drum rebuilt     x2   TYMPANOPLASTY Left 04/23/2016   Procedure: TYMPANOPLASTY LEFT EAR;  Surgeon: Serena Colonel, MD;  Location: [facility];  Service: ENT;  Laterality: Left;   VAGINAL HYSTERECTOMY N/A 09/01/2012   Procedure: HYSTERECTOMY VAGINAL;  Surgeon: Geryl Rankins, MD;  Location: [facility];  Service: Gynecology;  Laterality: N/A;    Family Psychiatric History: Reviewed.  Family History:  Family History  Problem Relation Age of Onset   Cancer Other        family hx   Diabetes Other        family hx   Depression Other        family hx    Coronary artery disease Mother        also MVD   Hyperlipidemia Mother    Hypertension Mother    Hypertension Father    Diabetes Father    Diabetes Maternal Grandmother    Colon cancer Paternal Grandmother    Breast cancer Maternal Aunt    Anesthesia problems Neg Hx     Social History:  Social History   Socioeconomic History   Marital status: Married    Spouse name: Not on file   Number of children: 2   Years of education: GED   Highest education level: Not on file  Occupational History   Occupation: Housewife  Tobacco Use   Smoking status: Former    Packs/day: 0.25    Years: 8.00    Pack years: 2.00    Types: Cigarettes   Smokeless tobacco: Never   Tobacco comments:    up to 1/2 ppl x 8 years  Substance and Sexual Activity   Alcohol use: No   Drug use: No   Sexual activity: Yes    Birth control/protection: Surgical  Other Topics Concern   Not on file  Social History Narrative   Lives with daughter, son and husband in a one story home.  Has 2 children.  Does not work at this time.  Education: GED.    Right-handed.   4-5 twelve ounce cans per day.   Social Determinants of Health   Financial Resource Strain: Not on file  Food Insecurity: Not on file  Transportation Needs: Not on file  Physical Activity: Not on file  Stress: Not on file  Social Connections: Not on file    Allergies:  Allergies  Allergen Reactions   Hydrocodone-Acetaminophen Hives and Swelling    Hives   Penicillins Hives    Metabolic Disorder Labs: No results  found for: HGBA1C, MPG No results found for: PROLACTIN No results found for: CHOL, TRIG, HDL, CHOLHDL, VLDL, LDLCALC No results found for: TSH  Therapeutic Level Labs: No results found for: LITHIUM No results found for: VALPROATE No components found for:  CBMZ  Current Medications: Current Outpatient Medications  Medication Sig Dispense Refill   buPROPion (WELLBUTRIN XL) 300 MG 24 hr tablet Take 1 tablet (300 mg total) by mouth daily. 30 tablet 0   ciprofloxacin-dexamethasone (CIPRODEX) otic suspension Place 3 drops into the left ear 3 (three) times daily. 7.5 mL 2   diazepam (VALIUM) 10 MG  tablet Take 1 tablet (10 mg total) by mouth every 8 (eight) hours as needed for anxiety. 90 tablet 0   diclofenac sodium (VOLTAREN) 1 % GEL Apply 4 g topically 4 (four) times daily as needed. 100 g 11   dicyclomine (BENTYL) 10 MG capsule Take 10 mg by mouth 4 (four) times daily -  before meals and at bedtime.     ibuprofen (ADVIL,MOTRIN) 800 MG tablet Take 1 tablet (800 mg total) by mouth every 8 (eight) hours as needed for pain. For pain 30 tablet 2   lamoTRIgine (LAMICTAL) 200 MG tablet Take 1 tablet (200 mg total) by mouth at bedtime. 30 tablet 1   meloxicam (MOBIC) 15 MG tablet Take 1 tablet (15 mg total) by mouth daily. One tab after meal 30 tablet 11   mirtazapine (REMERON) 15 MG tablet Take 0.5 tablets (7.5 mg total) by mouth at bedtime. 15 tablet 0   Multiple Vitamin (MULTIVITAMIN WITH MINERALS) TABS Take 1 tablet by mouth daily.     pantoprazole (PROTONIX) 40 MG tablet Take 1 tablet (40 mg total) by mouth daily. 30 tablet 0   polyethylene glycol (MIRALAX / GLYCOLAX) packet Take 17 g by mouth daily.     promethazine (PHENERGAN) 25 MG tablet Take 1 tablet (25 mg total) by mouth 3 (three) times daily as needed for nausea or vomiting. 90 tablet 1   sucralfate (CARAFATE) 1 g tablet Take 1 tablet (1 g total) by mouth 4 (four) times daily -  with meals and at bedtime. 30 tablet 0   No current  facility-administered medications for this visit.     Psychiatric Specialty Exam: Please take into account limitations in obtaining a full mental status in the context of phone communication Review of Systems  Constitutional:  Positive for appetite change.  Psychiatric/Behavioral:  Positive for sleep disturbance. The patient is nervous/anxious.   All other systems reviewed and are negative.  Last menstrual period 08/25/2012.There is no height or weight on file to calculate BMI.  General Appearance: NA  Eye Contact:  NA  Speech:  Clear and Coherent  Volume:  Normal  Mood: reports some persistent depression, although endorses partial improvement and affect presents more reactive   Affect:  as above   Thought Process:  Goal Directed  Orientation:  Full (Time, Place, and Person)  Thought Content: Denies suicidal ideations or self-injurious thoughts, no HI , no hallucinations, no delusions expressed   Suicidal Thoughts:  No she denies any suicidal or self injurious ideations, no HI   Homicidal Thoughts:  No  Memory:  Grossly intact   Judgement:  Good  Insight:  Fair  Psychomotor Activity:  NA  Concentration: grossly intact   Recall:  Good  Fund of Knowledge: Good  Language: Good  Akathisia:  Negative  Handed:  Right  AIMS (if indicated): not done  Assets:  Communication Skills Desire for Improvement Financial Resources/Insurance Housing Social Support Talents/Skills  ADL's:  Intact  Cognition: WNL  Sleep:  Fair    Assessment and Plan: 45 yo female with a history of chronic depression and anxiety.  She has been diagnosed with MDD and GAD.  Reviewed history, does not endorse any clear history of mania or hypomania.  Endorses some improvement although continues to report depression /sadness . No SI, no HI. Her sister's anniversary of death was earlier this month, but reports was able to spend time with friends and describes having a good support system. Recently had COVID and  was treated with  Paxlovid  Denies medication side effects, has tolerated Remeron trial well thus far, and agrees to titrating dose further. Side effects, including potential increased risk of seizures and potential sedation/weight gain associated with Remeron reviewed . Agrees to gradual BZD taper- currently on Valium 10 mgr TID PRN .     Dx: MDD with mixed features (vs bipolar disorder, unspecified, depressed); GAD ; Bereavement   Plan:  Will continue regimen as below- Continue Lamictal 200 mg daily Continue Wellbutrin XL 300 mg daily Increase Remeron to 15 mgr QHS  Decrease  Valium to 10 mg Q 12 hours PRN for anxiety Next appointment in  4  weeks . Agrees to contact clinic sooner should to be any worsening or concern prior.Agrees to go to ED if SI emerge or needs acute treatment  Continue individual psychotherapy    Craige Cotta, MD 05/08/2021, 9:59 AMPatient ID: Monica Ballard, female   DOB: 08-08-1975, 45 y.o.   MRN: [id number]

## 2021-06-19 ENCOUNTER — Telehealth (HOSPITAL_BASED_OUTPATIENT_CLINIC_OR_DEPARTMENT_OTHER): Payer: No Typology Code available for payment source | Admitting: Psychiatry

## 2021-06-19 ENCOUNTER — Encounter (HOSPITAL_COMMUNITY): Payer: Self-pay | Admitting: Psychiatry

## 2021-06-19 ENCOUNTER — Other Ambulatory Visit: Payer: Self-pay

## 2021-06-19 DIAGNOSIS — F321 Major depressive disorder, single episode, moderate: Secondary | ICD-10-CM | POA: Diagnosis not present

## 2021-06-19 MED ORDER — LAMOTRIGINE 200 MG PO TABS: 200.0000 mg | ORAL_TABLET | Freq: Every day | ORAL | 1 refills | Status: DC

## 2021-06-19 MED ORDER — SERTRALINE HCL 50 MG PO TABS: 50.0000 mg | ORAL_TABLET | Freq: Every day | ORAL | 1 refills | Status: DC

## 2021-06-19 MED ORDER — DIAZEPAM 10 MG PO TABS: 10.0000 mg | ORAL_TABLET | Freq: Two times a day (BID) | ORAL | 1 refills | Status: DC | PRN

## 2021-06-19 MED ORDER — BUPROPION HCL ER (XL) 300 MG PO TB24: 300.0000 mg | ORAL_TABLET | Freq: Every day | ORAL | 1 refills | Status: DC

## 2021-06-19 NOTE — Progress Notes (Signed)
[place] MD/PA/NP OP Progress Note  06/19/2021 9:41 AM Monica Ballard  MRN:  [id number] Interview was conducted by phone and I verified that I was speaking with the correct person using two identifiers. I discussed the limitations of evaluation and management by telemedicine and  the availability of in person appointments.  Participants in the visit:  patient (location - home);  physician (location - clinic office). Duration 25 minutes  Chief Complaint:   Returns for medication management appointment  HPI: 45 yo married white female initially referred by her PCP for treatment of chronic depression and anxiety resistant to treatment. Reported history of chronic anxiety, depression over a period of years, with several antidepressant trials in the past that she did not feel worked.  She has no hx of mania, psychosis, alcohol or drug abuse.    She reports she has continued to feel anxious often, with a tendency to worry excessively , and also describes some agoraphobia. Reports that during recent thanksgiving family reunion she felt " panicky" and had to go outside for a period of time as she felt anxious .  Also describes some lingering depression and a subjective feeling of low motivation . Denies suicidal ideations and presents future oriented . Denies hallucinations/no psychotic symptoms. She reports she has gained weight over the last few months, which could be related to Remeron trial. Otherwise, denies medication side effects. ( Has been on Wellbutrin and Lamictal for more than a year, tolerates these well ). Valium  was tapered to 10 mgr BID PRN for anxiety,- reports she has been taking regularly. Denies sedation or other side effects.  We discussed options - preference at this time is to discontinue Remeron, as she feels it is not working well for her and possibly related to weight gain. Reviewed other antidepressant options. Prozac was not well tolerated . She remembers she was tried on Celexa and  on Zoloft in the past, does not remember having side effects          Visit Diagnosis:  No diagnosis found.  Past Psychiatric History: Please see intake H&P.  Past Medical History:  Past Medical History:  Diagnosis Date   Anemia    Anxiety    Asthma    smoker-uses nebulizer-not used in last year, albuterol rescue inhaler used in 11/13 preoperatively   Chronic bronchitis (HCC)    nebulizer and rescue inhaler   Depression    GERD (gastroesophageal reflux disease)    occasional -tums   Headache(784.0)    Hearing loss in left ear    Hypercholesteremia    Hyperlipidemia    Missed ab 01/09/2012   10weeks-used cytotec   Numbness and tingling    PONV (postoperative nausea and vomiting)    Preterm labor    Teeth decayed    lower , nothing loose-encouraged pt to seek dental care if necessary    Past Surgical History:  Procedure Laterality Date   CHOLECYSTECTOMY, LAPAROSCOPIC  12/06/2010   DILATION AND CURETTAGE OF UTERUS  06/11/2012   Procedure: DILATATION AND CURETTAGE;  Surgeon: Thurnell Lose, MD;  Location: [facility];  Service: Gynecology;  Laterality: N/A;  sec procedure start  [place]  01/16/2012   Procedure: DILATATION AND EVACUATION;  Surgeon: Thurnell Lose, MD;  Location: [facility];  Service: Gynecology;  Laterality: N/A;   LAPAROSCOPY  06/11/2012   Procedure: LAPAROSCOPY OPERATIVE;  Surgeon: Thurnell Lose, MD;  Location: [facility];  Service: Gynecology;  Laterality: N/A;   left ear  drum rebuilt     x2   TYMPANOPLASTY Left 04/23/2016   Procedure: TYMPANOPLASTY LEFT EAR;  Surgeon: Izora Gala, MD;  Location: [facility];  Service: ENT;  Laterality: Left;   VAGINAL HYSTERECTOMY N/A 09/01/2012   Procedure: HYSTERECTOMY VAGINAL;  Surgeon: Thurnell Lose, MD;  Location: [facility];  Service: Gynecology;  Laterality: N/A;    Family Psychiatric History: Reviewed.  Family History:  Family History  Problem Relation Age of Onset   Cancer Other         family hx   Diabetes Other        family hx   Depression Other        family hx    Coronary artery disease Mother        also MVD   Hyperlipidemia Mother    Hypertension Mother    Hypertension Father    Diabetes Father    Diabetes Maternal Grandmother    Colon cancer Paternal Grandmother    Breast cancer Maternal Aunt    Anesthesia problems Neg Hx     Social History:  Social History   Socioeconomic History   Marital status: Married    Spouse name: Not on file   Number of children: 2   Years of education: GED   Highest education level: Not on file  Occupational History   Occupation: Housewife  Tobacco Use   Smoking status: Former    Packs/day: 0.25    Years: 8.00    Pack years: 2.00    Types: Cigarettes   Smokeless tobacco: Never   Tobacco comments:    up to 1/2 ppl x 8 years  Substance and Sexual Activity   Alcohol use: No   Drug use: No   Sexual activity: Yes    Birth control/protection: Surgical  Other Topics Concern   Not on file  Social History Narrative   Lives with daughter, son and husband in a one story home.  Has 2 children.  Does not work at this time.  Education: GED.    Right-handed.   4-5 twelve ounce cans per day.   Social Determinants of Health   Financial Resource Strain: Not on file  Food Insecurity: Not on file  Transportation Needs: Not on file  Physical Activity: Not on file  Stress: Not on file  Social Connections: Not on file    Allergies:  Allergies  Allergen Reactions   Hydrocodone-Acetaminophen Hives and Swelling    Hives   Penicillins Hives    Metabolic Disorder Labs: No results found for: HGBA1C, MPG No results found for: PROLACTIN No results found for: CHOL, TRIG, HDL, CHOLHDL, VLDL, LDLCALC No results found for: TSH  Therapeutic Level Labs: No results found for: LITHIUM No results found for: VALPROATE No components found for:  CBMZ  Current Medications: Current Outpatient Medications  Medication Sig  Dispense Refill   buPROPion (WELLBUTRIN XL) 300 MG 24 hr tablet Take 1 tablet (300 mg total) by mouth daily. 30 tablet 1   ciprofloxacin-dexamethasone (CIPRODEX) otic suspension Place 3 drops into the left ear 3 (three) times daily. 7.5 mL 2   diazepam (VALIUM) 10 MG tablet Take 1 tablet (10 mg total) by mouth every 12 (twelve) hours as needed for anxiety. 60 tablet 1   diclofenac sodium (VOLTAREN) 1 % GEL Apply 4 g topically 4 (four) times daily as needed. 100 g 11   dicyclomine (BENTYL) 10 MG capsule Take 10 mg by mouth 4 (four) times daily -  before meals  and at bedtime.     ibuprofen (ADVIL,MOTRIN) 800 MG tablet Take 1 tablet (800 mg total) by mouth every 8 (eight) hours as needed for pain. For pain 30 tablet 2   lamoTRIgine (LAMICTAL) 200 MG tablet Take 1 tablet (200 mg total) by mouth at bedtime. 30 tablet 1   meloxicam (MOBIC) 15 MG tablet Take 1 tablet (15 mg total) by mouth daily. One tab after meal 30 tablet 11   mirtazapine (REMERON) 15 MG tablet Take 1 tablet (15 mg total) by mouth at bedtime. 30 tablet 1   Multiple Vitamin (MULTIVITAMIN WITH MINERALS) TABS Take 1 tablet by mouth daily.     pantoprazole (PROTONIX) 40 MG tablet Take 1 tablet (40 mg total) by mouth daily. 30 tablet 0   polyethylene glycol (MIRALAX / GLYCOLAX) packet Take 17 g by mouth daily.     promethazine (PHENERGAN) 25 MG tablet Take 1 tablet (25 mg total) by mouth 3 (three) times daily as needed for nausea or vomiting. 90 tablet 1   sucralfate (CARAFATE) 1 g tablet Take 1 tablet (1 g total) by mouth 4 (four) times daily -  with meals and at bedtime. 30 tablet 0   No current facility-administered medications for this visit.     Psychiatric Specialty Exam: Please take into account limitations in obtaining a full mental status in the context of phone communication Review of Systems  Constitutional:  Positive for appetite change.  Psychiatric/Behavioral:  Positive for sleep disturbance. The patient is  nervous/anxious.   All other systems reviewed and are negative.  Last menstrual period 08/25/2012.There is no height or weight on file to calculate BMI.  General Appearance: NA  Eye Contact:  NA  Speech:  Clear and Coherent  Volume:  Normal  Mood: reports some persistent depression  Affect:  reactive  Thought Process:  Goal Directed  Orientation:  Full (Time, Place, and Person)  Thought Content: Denies suicidal ideations or self-injurious thoughts, no HI , no hallucinations, no delusions expressed   Suicidal Thoughts:  No she denies any suicidal or self injurious ideations, no HI   Homicidal Thoughts:  No  Memory:  Grossly intact   Judgement:  Good  Insight:  Fair  Psychomotor Activity:  NA  Concentration: grossly intact   Recall:  Good  Fund of Knowledge: Good  Language: Good  Akathisia:  Negative  Handed:  Right  AIMS (if indicated): not done  Assets:  Communication Skills Desire for Improvement Financial Resources/Insurance Housing Social Support Talents/Skills  ADL's:  Intact  Cognition: WNL  Sleep:  Fair    Assessment and Plan: 45 yo female with a history of chronic depression and anxiety.  She has been diagnosed with MDD and GAD.  Reviewed history, does not endorse any clear history of mania or hypomania.  She reports she has been experiencing some residual depression and anxiety, and describes episodes of increased anxiety/agoraphobia, most recently reports she had to step outside during thanksgiving get together due to increased anxiety. Denies suicidal ideations. She reports she has been gaining weight recently,possibly related to Remeron and is wanting to change to another medication. Valium was tapered down from 10 mgr TID to 10 mgr BID , in general has tolerated taper well, but reports some lingering /ongoing anxiety.  We reviewed side effects , to include potential risk of severe rash on lamotrigine , risk of seizures on buproprion, risk of sedation/habituation on  diazepam    Dx: MDD with mixed features (vs bipolar disorder, unspecified, depressed);  GAD ; Bereavement   Plan:  Will continue regimen as below- Continue Lamictal 200 mg daily Continue Wellbutrin XL 300 mg daily D/C Remeron Start Zoloft 50 mgr QDAY for depression, anxiety Continue  Valium 10 mg Q 12 hours PRN for anxiety Next appointment in  4  weeks . Agrees to contact clinic sooner should to be any worsening or concern prior. Agrees to go to ED if SI emerge or needs acute treatment  Continue individual psychotherapy    Craige Cotta, MD 06/19/2021, 9:41 AM  Patient ID: Monica Ballard, female   DOB: 10/17/1975, 45 y.o.   MRN: [id number]

## 2021-06-20 ENCOUNTER — Ambulatory Visit (INDEPENDENT_AMBULATORY_CARE_PROVIDER_SITE_OTHER): Payer: No Typology Code available for payment source | Admitting: Clinical

## 2021-06-20 DIAGNOSIS — F322 Major depressive disorder, single episode, severe without psychotic features: Secondary | ICD-10-CM | POA: Diagnosis not present

## 2021-06-20 DIAGNOSIS — F411 Generalized anxiety disorder: Secondary | ICD-10-CM

## 2021-06-20 NOTE — Progress Notes (Signed)
   THERAPIST PROGRESS NOTE  Session Time: 1pm  Participation Level: Active  Behavioral Response: AlertDepressed and tearful, worried  Type of Therapy: Individual Therapy  Treatment Goals addressed: Anxiety and Coping  Interventions: Supportive Virtual Visit via Telephone Note  I connected with Monica Ballard on 06/20/21 at  1:00 PM EST by telephone and verified that I am speaking with the correct person using two identifiers.  Location: Patient: home Provider: office   I discussed the limitations, risks, security and privacy concerns of performing an evaluation and management service by telephone and the availability of in person appointments. I also discussed with the patient that there may be a patient responsible charge related to this service. The patient expressed understanding and agreed to proceed.   I discussed the assessment and treatment plan with the patient. The patient was provided an opportunity to ask questions and all were answered. The patient agreed with the plan and demonstrated an understanding of the instructions.   The patient was advised to call back or seek an in-person evaluation if the symptoms worsen or if the condition fails to improve as anticipated.  I provided 50 minutes of non-face-to-face time during this encounter.  Summary: Pt presents in a tearful manner. Pt endorses poor sleep pattern, increased anxiety, social isolation, unmotivated. Pt states spending great deal of time worrying about things that are in and out of her control. Pt says she has experienced impairment in her daily functioning as evidenced by neglecting her ADL's. Additionally, pt says she has low libido and has become standoffish from her husband. Pt states her husband has difficulty understanding how depressed state is effecting her overall mood. Per pt report, her daughter has been very supportive but has noticed changes in her mood and behavior. Pt states physician has prescribed her  Zoloft and Wellbutrin but says she is reluctant to take the medication because "I dont know why I have to take two antidepressants.   Suicidal/Homicidal: Pt denies SI/HI no plan, intent or attempt to harm self or others reported.  Therapist Response: CSW assessed for changes in mood and behavior, daily functioning. CSW actively listened and supported pt as she discussed challenges with managing mood. CSW discussed with pt scheduling worry time, no more than 15 minute intervals; when completed must engage in an activity that promotes her health and wellness. CSW processed with pt what could happen vs what will happen to challenge irrational beliefs. CSW discussed with pt importance of being intentional and consistent with implementing healthy coping skills to manage life stressors; highlighted minimal progress due to inconsistency.              Plan: Return again in 2 weeks.  Diagnosis: Axis I: Generalized anxiety disorder Current severe episode of major depressive disorder w/out psychotic features    Axis II: No diagnosis    Suzan Slick, LCSW 06/20/2021

## 2021-07-04 ENCOUNTER — Telehealth (HOSPITAL_BASED_OUTPATIENT_CLINIC_OR_DEPARTMENT_OTHER): Payer: No Typology Code available for payment source | Admitting: Psychiatry

## 2021-07-04 ENCOUNTER — Encounter (HOSPITAL_COMMUNITY): Payer: Self-pay | Admitting: Psychiatry

## 2021-07-04 ENCOUNTER — Other Ambulatory Visit: Payer: Self-pay

## 2021-07-04 ENCOUNTER — Ambulatory Visit (INDEPENDENT_AMBULATORY_CARE_PROVIDER_SITE_OTHER): Payer: No Typology Code available for payment source | Admitting: Clinical

## 2021-07-04 DIAGNOSIS — F331 Major depressive disorder, recurrent, moderate: Secondary | ICD-10-CM | POA: Diagnosis not present

## 2021-07-04 DIAGNOSIS — F411 Generalized anxiety disorder: Secondary | ICD-10-CM

## 2021-07-04 DIAGNOSIS — F324 Major depressive disorder, single episode, in partial remission: Secondary | ICD-10-CM | POA: Diagnosis not present

## 2021-07-04 MED ORDER — BUPROPION HCL ER (XL) 150 MG PO TB24: 150.0000 mg | ORAL_TABLET | Freq: Every day | ORAL | 1 refills | Status: DC

## 2021-07-04 MED ORDER — DIAZEPAM 10 MG PO TABS: 10.0000 mg | ORAL_TABLET | Freq: Two times a day (BID) | ORAL | 1 refills | Status: DC | PRN

## 2021-07-04 MED ORDER — LAMOTRIGINE 200 MG PO TABS: 200.0000 mg | ORAL_TABLET | Freq: Every day | ORAL | 1 refills | Status: DC

## 2021-07-04 MED ORDER — SERTRALINE HCL 50 MG PO TABS: 75.0000 mg | ORAL_TABLET | Freq: Every day | ORAL | 1 refills | Status: DC

## 2021-07-04 NOTE — Progress Notes (Signed)
° °  THERAPIST PROGRESS NOTE  Session Time: 9am  Participation Level: Active  Behavioral Response: AlertAnxious  Type of Therapy: Individual Therapy  Treatment Goals addressed: Anxiety  Interventions: CBT Virtual Visit via Telephone Note  I connected with Monica Ballard on 07/04/21 at  9:00 AM EST by telephone and verified that I am speaking with the correct person using two identifiers.  Location: Patient: home Provider: office   I discussed the limitations, risks, security and privacy concerns of performing an evaluation and management service by telephone and the availability of in person appointments. I also discussed with the patient that there may be a patient responsible charge related to this service. The patient expressed understanding and agreed to proceed.   I discussed the assessment and treatment plan with the patient. The patient was provided an opportunity to ask questions and all were answered. The patient agreed with the plan and demonstrated an understanding of the instructions.   The patient was advised to call back or seek an in-person evaluation if the symptoms worsen or if the condition fails to improve as anticipated.  I provided 45 minutes of non-face-to-face time during this encounter.  Summary: Pt presents in an anxious manner. Pt says she is shaky and nauseous. Pt appeared to be proud of herself as she discussed volunteering at a holiday giveaway this past weekend. Pt says she did feel anxious(nausea,shakiness) but was able to complete the event. Pt states she had to go to her car 1x when her anxiety increased but overall did a good job with interacting with others. Additionally pt states she went holiday shopping with her husband and went to the car to calm herself when she became overwhelmed. Per pt report psychiatrist(dr. Cobos) is making adjustments to her medications and says she is noticing some improvement in her mood. Pt states she has felt more energetic  stating cleaning her house, showering, putting on makeup, etc.  Suicidal/Homicidal:Pt denies SI/HI no plan, intent or attempt to harm self or others reported.  Therapist Response: Verbally praised pt for challenging irrational beliefs during most recent social/community events. Reviewed and practiced with pt deep breathing exercises to manage anxiety. Introduced to pt progressive muscle relaxation as she discussed feeling tension in her body today. Actively listened as pt discussed how moments of gratitude improved her mood. Hw assignment=practice intervention, discuss next session.  Plan: Return again in 3 weeks.  Diagnosis: Axis I: Major depressive disorder, recurrent, moderate Generalized anxiety disorder    Axis II: No diagnosis    Suzan Slick, LCSW 07/04/2021

## 2021-07-04 NOTE — Progress Notes (Signed)
[facility] MD/PA/NP OP Progress Note  07/04/2021 8:31 AM JORJA CHAU  MRN:  [id number] Interview was conducted by phone and I verified that I was speaking with the correct person using two identifiers. I discussed the limitations of evaluation and management by telemedicine and  the availability of in person appointments.  Participants in the visit:  patient (location - home);  physician (location - clinic office). Duration 25 minutes  Chief Complaint:   Returns for medication management appointment  HPI: 45 yo married white female initially referred by her PCP for treatment of chronic depression and anxiety resistant to treatment. Reported history of chronic anxiety, depression over a period of years, with several antidepressant trials in the past that she did not feel worked.  She has no hx of mania, psychosis, alcohol or drug abuse.   Ms Talmadge reports that her mood has partially improved . She  has recently noted improved energy level, having more interest in activities. She also reports appetite has decreased since she stopped Remeron, which she was concerned could be causing increased appetite and some weight gain.  She denies suicidal ideations and is future oriented , planning for example to spend Christmas holiday with husband's family . Denies hallucinations , and no psychotic symptoms are endorsed or noted .  She does continue to experience significant anxiety, which she describes both as excessive worrying and as having panic attacks, at times triggered by being in public /crowded places. She states anxiety continues to be severe.  We revisited psychiatric history/ med history. She reports that depression/anxiety started following her mother's/grandmother's death in 2000-11-01 and has had chronic/waxing and waning anxiety and mood symptoms since then. She has never attempted suicide , and has not been admitted to a psychiatric inpatient unit . No history of psychosis. She has tried several  medications over the years, and states she took Xanax in the past, later Klonopin , and more recently Valium over the last year or two.  She has been on Wellbutrin for more than a year. Zoloft was recently started , and thus far has been well tolerated .             Visit Diagnosis:  No diagnosis found.  Past Psychiatric History: Please see intake H&P.  Past Medical History:  Past Medical History:  Diagnosis Date   Anemia    Anxiety    Asthma    smoker-uses nebulizer-not used in last year, albuterol rescue inhaler used in 11/13 preoperatively   Chronic bronchitis (HCC)    nebulizer and rescue inhaler   Depression    GERD (gastroesophageal reflux disease)    occasional -tums   Headache(784.0)    Hearing loss in left ear    Hypercholesteremia    Hyperlipidemia    Missed ab 01/09/2012   10weeks-used cytotec   Numbness and tingling    PONV (postoperative nausea and vomiting)    Preterm labor    Teeth decayed    lower , nothing loose-encouraged pt to seek dental care if necessary    Past Surgical History:  Procedure Laterality Date   CHOLECYSTECTOMY, LAPAROSCOPIC  12/06/2010   DILATION AND CURETTAGE OF UTERUS  06/11/2012   Procedure: DILATATION AND CURETTAGE;  Surgeon: Thurnell Lose, MD;  Location: [facility];  Service: Gynecology;  Laterality: N/A;  sec procedure start  [place]  01/16/2012   Procedure: DILATATION AND EVACUATION;  Surgeon: Thurnell Lose, MD;  Location: [facility];  Service: Gynecology;  Laterality: N/A;  LAPAROSCOPY  06/11/2012   Procedure: LAPAROSCOPY OPERATIVE;  Surgeon: Thurnell Lose, MD;  Location: [facility];  Service: Gynecology;  Laterality: N/A;   left ear drum rebuilt     x2   TYMPANOPLASTY Left 04/23/2016   Procedure: TYMPANOPLASTY LEFT EAR;  Surgeon: Izora Gala, MD;  Location: [facility];  Service: ENT;  Laterality: Left;   VAGINAL HYSTERECTOMY N/A 09/01/2012   Procedure: HYSTERECTOMY VAGINAL;  Surgeon: Thurnell Lose, MD;  Location: [facility];  Service: Gynecology;  Laterality: N/A;    Family Psychiatric History: Reviewed.  Family History:  Family History  Problem Relation Age of Onset   Cancer Other        family hx   Diabetes Other        family hx   Depression Other        family hx    Coronary artery disease Mother        also MVD   Hyperlipidemia Mother    Hypertension Mother    Hypertension Father    Diabetes Father    Diabetes Maternal Grandmother    Colon cancer Paternal Grandmother    Breast cancer Maternal Aunt    Anesthesia problems Neg Hx     Social History:  Social History   Socioeconomic History   Marital status: Married    Spouse name: Not on file   Number of children: 2   Years of education: GED   Highest education level: Not on file  Occupational History   Occupation: Housewife  Tobacco Use   Smoking status: Former    Packs/day: 0.25    Years: 8.00    Pack years: 2.00    Types: Cigarettes   Smokeless tobacco: Never   Tobacco comments:    up to 1/2 ppl x 8 years  Substance and Sexual Activity   Alcohol use: No   Drug use: No   Sexual activity: Yes    Birth control/protection: Surgical  Other Topics Concern   Not on file  Social History Narrative   Lives with daughter, son and husband in a one story home.  Has 2 children.  Does not work at this time.  Education: GED.    Right-handed.   4-5 twelve ounce cans per day.   Social Determinants of Health   Financial Resource Strain: Not on file  Food Insecurity: Not on file  Transportation Needs: Not on file  Physical Activity: Not on file  Stress: Not on file  Social Connections: Not on file    Allergies:  Allergies  Allergen Reactions   Hydrocodone-Acetaminophen Hives and Swelling    Hives   Penicillins Hives    Metabolic Disorder Labs: No results found for: HGBA1C, MPG No results found for: PROLACTIN No results found for: CHOL, TRIG, HDL, CHOLHDL, VLDL, LDLCALC No results found for:  TSH  Therapeutic Level Labs: No results found for: LITHIUM No results found for: VALPROATE No components found for:  CBMZ  Current Medications: Current Outpatient Medications  Medication Sig Dispense Refill   buPROPion (WELLBUTRIN XL) 300 MG 24 hr tablet Take 1 tablet (300 mg total) by mouth daily. 30 tablet 1   ciprofloxacin-dexamethasone (CIPRODEX) otic suspension Place 3 drops into the left ear 3 (three) times daily. 7.5 mL 2   diazepam (VALIUM) 10 MG tablet Take 1 tablet (10 mg total) by mouth every 12 (twelve) hours as needed for anxiety. 60 tablet 1   diclofenac sodium (VOLTAREN) 1 % GEL Apply 4 g topically 4 (four)  times daily as needed. 100 g 11   dicyclomine (BENTYL) 10 MG capsule Take 10 mg by mouth 4 (four) times daily -  before meals and at bedtime.     ibuprofen (ADVIL,MOTRIN) 800 MG tablet Take 1 tablet (800 mg total) by mouth every 8 (eight) hours as needed for pain. For pain 30 tablet 2   lamoTRIgine (LAMICTAL) 200 MG tablet Take 1 tablet (200 mg total) by mouth at bedtime. 30 tablet 1   meloxicam (MOBIC) 15 MG tablet Take 1 tablet (15 mg total) by mouth daily. One tab after meal 30 tablet 11   Multiple Vitamin (MULTIVITAMIN WITH MINERALS) TABS Take 1 tablet by mouth daily.     pantoprazole (PROTONIX) 40 MG tablet Take 1 tablet (40 mg total) by mouth daily. 30 tablet 0   polyethylene glycol (MIRALAX / GLYCOLAX) packet Take 17 g by mouth daily.     promethazine (PHENERGAN) 25 MG tablet Take 1 tablet (25 mg total) by mouth 3 (three) times daily as needed for nausea or vomiting. 90 tablet 1   sertraline (ZOLOFT) 50 MG tablet Take 1 tablet (50 mg total) by mouth daily. 30 tablet 1   sucralfate (CARAFATE) 1 g tablet Take 1 tablet (1 g total) by mouth 4 (four) times daily -  with meals and at bedtime. 30 tablet 0   No current facility-administered medications for this visit.     Psychiatric Specialty Exam: Please take into account limitations in obtaining a full mental status  in the context of phone communication Review of Systems  All other systems reviewed and are negative. As above, describes decreased sleep, chronic anxiety  Last menstrual period 08/25/2012.There is no height or weight on file to calculate BMI.  General Appearance: NA  Eye Contact:  NA  Speech:  Clear and Coherent  Volume:  Normal  Mood: reports mood has improved and feeling " better"  Affect:  reactive/ anxious   Thought Process:  Goal Directed  Orientation:  Full (Time, Place, and Person)  Thought Content: Denies suicidal ideations or self-injurious thoughts, no HI , no hallucinations, no delusions expressed   Suicidal Thoughts:  No she denies any suicidal or self injurious ideations, no HI   Homicidal Thoughts:  No  Memory:  Grossly intact   Judgement:  Good  Insight:  Fair  Psychomotor Activity:  NA  Concentration: grossly intact   Recall:  Good  Fund of Knowledge: Good  Language: Good  Akathisia:  Negative  Handed:  Right  AIMS (if indicated): not done  Assets:  Communication Skills Desire for Improvement Financial Resources/Insurance Housing Social Support Talents/Skills  ADL's:  Intact  Cognition: WNL  Sleep:  Fair    Assessment and Plan: 45 yo female with a history of chronic depression and anxiety.  She has been diagnosed with MDD and GAD.  Reviewed history, does not endorse any clear history of mania or hypomania.  Reports improving mood , states she has been feeling better and has noticed partially improved energy level and improving anhedonia. Denies SI and is future oriented . She does continue to report significant anxiety, described both as excessive worry/ anxious ruminations and panic attacks . These have been limiting and does describe some degree of agoraphobia. Zoloft was recently started and thus far has tolerated well without side effects ( describes intermittent nausea, but feels it may be related to anxiety rather than medication) . On Valium 10 mgr BID,  and reports she has been on some BZD (  Xanax, Klonopin in the past, more recently Valium ) for years . Denies abuse or misuse . We reviewed options - agrees to continue Zoloft dose titration and to taper Wellbutrin XL dose ( to minimize possible adverse effects re: anxiety and sleep) .  For now will continue Valium at same dose, but we reviewed potential goal of tapering off BZDs gradually .   Reviewed safety planning- patient reports family/husband are supportive . She has Crisis Hotline phone number in case she needs it and also agrees to go to ED or to [facility] if needed .     Dx: MDD with mixed features (vs bipolar disorder, unspecified, depressed); GAD ; Bereavement   Plan:  Will continue regimen as below- Continue Lamictal 200 mg daily Decrease Wellbutrin XL to 150 mgr QDAY  Increase Zoloft to 75  mgr QDAY for depression, anxiety Continue  Valium 10 mg Q 12 hours PRN for anxiety Next appointment in  3-4  weeks . Agrees to contact clinic sooner should to be any worsening or concern prior. Agrees to go to ED if SI emerge or needs acute treatment  Continue individual psychotherapy Consider IOP .     Jenne Campus, MD 07/04/2021, 8:31 AM  Patient ID: [place], female   DOB: 1976/05/17, 45 y.o.   MRN: [id number]

## 2021-07-05 ENCOUNTER — Telehealth (HOSPITAL_COMMUNITY): Payer: Self-pay | Admitting: Psychiatry

## 2021-07-05 ENCOUNTER — Telehealth (HOSPITAL_COMMUNITY): Payer: Self-pay

## 2021-07-05 NOTE — Telephone Encounter (Signed)
D:  Dr. Jama Flavors referred pt back to [facility] again.  Case manager had spoken to pt on 03-30-21, but pt had declined.  A:  Called pt to re-orient her, but there was no answer.  Left vm for pt to call the case manager back.  Inform Dr. Jama Flavors.

## 2021-07-06 NOTE — Telephone Encounter (Signed)
error 

## 2021-07-14 ENCOUNTER — Telehealth (HOSPITAL_COMMUNITY): Payer: Self-pay | Admitting: *Deleted

## 2021-07-14 NOTE — Telephone Encounter (Signed)
VM left 

## 2021-07-14 NOTE — Telephone Encounter (Signed)
Tell ger to decrease Zoloft to 50 mg for now, call Dr. Jama Flavors next week

## 2021-07-14 NOTE — Telephone Encounter (Signed)
Pt of Dr. Jama Flavors who recently started cross taper of decreasing Wellbutrin and starting c/o nausea and "feeling funny" since Zoloft increased to 75 mg. Wellbutrin currently at 150 mg XL. Pt has an upcoming appointment with Dr. Jama Flavors on 07/31/21. Please review. Thank you for your help.

## 2021-07-27 ENCOUNTER — Other Ambulatory Visit: Payer: Self-pay

## 2021-07-27 ENCOUNTER — Ambulatory Visit (INDEPENDENT_AMBULATORY_CARE_PROVIDER_SITE_OTHER): Payer: No Typology Code available for payment source | Admitting: Clinical

## 2021-07-27 DIAGNOSIS — F331 Major depressive disorder, recurrent, moderate: Secondary | ICD-10-CM | POA: Diagnosis not present

## 2021-07-27 DIAGNOSIS — F411 Generalized anxiety disorder: Secondary | ICD-10-CM | POA: Diagnosis not present

## 2021-07-27 NOTE — Progress Notes (Addendum)
° °  THERAPIST PROGRESS NOTE  Session Time: 10am  Participation Level: Active  Behavioral Response: AlertAnxious  Type of Therapy: Individual Therapy  Treatment Goals addressed: Coping  Interventions: Supportive Virtual Visit via Telephone Note  I connected with [employer] on 07/27/21 at 10:00 AM EST by telephone and verified that I am speaking with the correct person using two identifiers.  Location: Patient: home Provider: office   I discussed the limitations, risks, security and privacy concerns of performing an evaluation and management service by telephone and the availability of in person appointments. I also discussed with the patient that there may be a patient responsible charge related to this service. The patient expressed understanding and agreed to proceed.   I discussed the assessment and treatment plan with the patient. The patient was provided an opportunity to ask questions and all were answered. The patient agreed with the plan and demonstrated an understanding of the instructions.   The patient was advised to call back or seek an in-person evaluation if the symptoms worsen or if the condition fails to improve as anticipated.  I provided 45 minutes of non-face-to-face time during this encounter.  Summary:  Pt reports she has been practicing deep breathing and progressive muscle relaxation techniques introduced during last session. Pt states it is difficult for her to relax. Pt endorses the following sxs sad mood most of the day; irritability; decreased interest in pleasurable activities; poor sleep pattern(avgs 4 hrs); fatigue most days and feeling of worthlessness("I feel like I make it miserable for others") Nevertheless pt reports she has participated in social events that have caused increased anxiety for her but says deep breathing has been helpful to manage her anxiety. Additionally, pt reports she is bathing more frequently(every other day). Pt is working toward  achieving treatment goal of decreasing depressive and anxiety sxs.  Suicidal/Homicidal: Pt denies SI/HI no plan, intent or attempt to harm self or others reported.  Therapist Response: CSW assessed for changes in mood, behavior and daily functionig. CSW reviewed progressive muscle relaxation and assessed for any barriers. Verbally praised pt for homework completion and highlighted some of her strengths that have assisted her in managing anxiety and depression.  Plan: Return again in 2 weeks.  Diagnosis: Axis I: Major depressive disorder, recurrent, moderate Generalized anxiety disorder    Axis II: No diagnosis    Suzan Slick, LCSW 07/27/2021

## 2021-07-31 ENCOUNTER — Other Ambulatory Visit: Payer: Self-pay

## 2021-07-31 ENCOUNTER — Telehealth (HOSPITAL_COMMUNITY): Payer: No Typology Code available for payment source | Admitting: Psychiatry

## 2021-07-31 ENCOUNTER — Encounter (HOSPITAL_COMMUNITY): Payer: Self-pay | Admitting: Psychiatry

## 2021-07-31 ENCOUNTER — Telehealth (HOSPITAL_BASED_OUTPATIENT_CLINIC_OR_DEPARTMENT_OTHER): Payer: No Typology Code available for payment source | Admitting: Psychiatry

## 2021-07-31 DIAGNOSIS — F411 Generalized anxiety disorder: Secondary | ICD-10-CM

## 2021-07-31 DIAGNOSIS — F324 Major depressive disorder, single episode, in partial remission: Secondary | ICD-10-CM | POA: Diagnosis not present

## 2021-07-31 MED ORDER — DIAZEPAM 5 MG PO TABS: 5.0000 mg | ORAL_TABLET | Freq: Three times a day (TID) | ORAL | 1 refills | Status: DC | PRN

## 2021-07-31 MED ORDER — BUPROPION HCL ER (SR) 100 MG PO TB12: 100.0000 mg | ORAL_TABLET | Freq: Every day | ORAL | 1 refills | Status: DC

## 2021-07-31 MED ORDER — SERTRALINE HCL 100 MG PO TABS: 100.0000 mg | ORAL_TABLET | Freq: Every day | ORAL | 1 refills | Status: DC

## 2021-07-31 MED ORDER — LAMOTRIGINE 200 MG PO TABS: 200.0000 mg | ORAL_TABLET | Freq: Every day | ORAL | 1 refills | Status: DC

## 2021-07-31 NOTE — Progress Notes (Signed)
[place] MD/PA/NP OP Progress Note  07/31/2021 3:38 PM SECOYA CAMPEN  MRN:  [id number] Interview was conducted by phone and I verified that I was speaking with the correct person using two identifiers. I discussed the limitations of evaluation and management by telemedicine and  the availability of in person appointments.  Participants in the visit:  patient (location - home);  physician (location - clinic office). Duration 20 minutes  Chief Complaint:   Returns for medication management appointment  HPI: 46 yo married white female initially referred by her PCP for treatment of chronic depression and anxiety resistant to treatment. Reported history of chronic anxiety, depression over a period of years, with several antidepressant trials in the past that she did not feel worked.  She has no hx of mania, psychosis, alcohol or drug abuse.   She reports that she has been feeling " a little better" and acknowledges her mood has tended to improve compared to before . She describes improving anhedonia and has been more active overall . Describes partially improved mood and anxiety, although persistent, has also decreased partially . Denies suicidal ideations . She reports her sleep  wake cycle has been disorganized and continues to find it hard to sleep at times, but then once she falls asleep wakes up in the late morning .  Denies medication side effects at this time. May have experienced some GI symptoms /nausea when she first started Zoloft but at this time does not endorse             Visit Diagnosis:  No diagnosis found.  Past Psychiatric History: Please see intake H&P.  Past Medical History:  Past Medical History:  Diagnosis Date   Anemia    Anxiety    Asthma    smoker-uses nebulizer-not used in last year, albuterol rescue inhaler used in 11/13 preoperatively   Chronic bronchitis (HCC)    nebulizer and rescue inhaler   Depression    GERD (gastroesophageal reflux disease)     occasional -tums   Headache(784.0)    Hearing loss in left ear    Hypercholesteremia    Hyperlipidemia    Missed ab 01/09/2012   10weeks-used cytotec   Numbness and tingling    PONV (postoperative nausea and vomiting)    Preterm labor    Teeth decayed    lower , nothing loose-encouraged pt to seek dental care if necessary    Past Surgical History:  Procedure Laterality Date   CHOLECYSTECTOMY, LAPAROSCOPIC  12/06/2010   DILATION AND CURETTAGE OF UTERUS  06/11/2012   Procedure: DILATATION AND CURETTAGE;  Surgeon: Thurnell Lose, MD;  Location: [facility];  Service: Gynecology;  Laterality: N/A;  sec procedure start  [place]  01/16/2012   Procedure: DILATATION AND EVACUATION;  Surgeon: Thurnell Lose, MD;  Location: [facility];  Service: Gynecology;  Laterality: N/A;   LAPAROSCOPY  06/11/2012   Procedure: LAPAROSCOPY OPERATIVE;  Surgeon: Thurnell Lose, MD;  Location: [facility];  Service: Gynecology;  Laterality: N/A;   left ear drum rebuilt     x2   TYMPANOPLASTY Left 04/23/2016   Procedure: TYMPANOPLASTY LEFT EAR;  Surgeon: Izora Gala, MD;  Location: [facility];  Service: ENT;  Laterality: Left;   VAGINAL HYSTERECTOMY N/A 09/01/2012   Procedure: HYSTERECTOMY VAGINAL;  Surgeon: Thurnell Lose, MD;  Location: [facility];  Service: Gynecology;  Laterality: N/A;    Family Psychiatric History: Reviewed.  Family History:  Family History  Problem Relation Age of Onset  Cancer Other        family hx   Diabetes Other        family hx   Depression Other        family hx    Coronary artery disease Mother        also MVD   Hyperlipidemia Mother    Hypertension Mother    Hypertension Father    Diabetes Father    Diabetes Maternal Grandmother    Colon cancer Paternal Grandmother    Breast cancer Maternal Aunt    Anesthesia problems Neg Hx     Social History:  Social History   Socioeconomic History   Marital status: Married    Spouse name: Not on file    Number of children: 2   Years of education: GED   Highest education level: Not on file  Occupational History   Occupation: Housewife  Tobacco Use   Smoking status: Former    Packs/day: 0.25    Years: 8.00    Pack years: 2.00    Types: Cigarettes   Smokeless tobacco: Never   Tobacco comments:    up to 1/2 ppl x 8 years  Substance and Sexual Activity   Alcohol use: No   Drug use: No   Sexual activity: Yes    Birth control/protection: Surgical  Other Topics Concern   Not on file  Social History Narrative   Lives with daughter, son and husband in a one story home.  Has 2 children.  Does not work at this time.  Education: GED.    Right-handed.   4-5 twelve ounce cans per day.   Social Determinants of Health   Financial Resource Strain: Not on file  Food Insecurity: Not on file  Transportation Needs: Not on file  Physical Activity: Not on file  Stress: Not on file  Social Connections: Not on file    Allergies:  Allergies  Allergen Reactions   Hydrocodone-Acetaminophen Hives and Swelling    Hives   Penicillins Hives    Metabolic Disorder Labs: No results found for: HGBA1C, MPG No results found for: PROLACTIN No results found for: CHOL, TRIG, HDL, CHOLHDL, VLDL, LDLCALC No results found for: TSH  Therapeutic Level Labs: No results found for: LITHIUM No results found for: VALPROATE No components found for:  CBMZ  Current Medications: Current Outpatient Medications  Medication Sig Dispense Refill   buPROPion (WELLBUTRIN XL) 150 MG 24 hr tablet Take 1 tablet (150 mg total) by mouth daily. 30 tablet 1   ciprofloxacin-dexamethasone (CIPRODEX) otic suspension Place 3 drops into the left ear 3 (three) times daily. 7.5 mL 2   diazepam (VALIUM) 10 MG tablet Take 1 tablet (10 mg total) by mouth every 12 (twelve) hours as needed for anxiety. 60 tablet 1   diclofenac sodium (VOLTAREN) 1 % GEL Apply 4 g topically 4 (four) times daily as needed. 100 g 11   dicyclomine (BENTYL)  10 MG capsule Take 10 mg by mouth 4 (four) times daily -  before meals and at bedtime.     ibuprofen (ADVIL,MOTRIN) 800 MG tablet Take 1 tablet (800 mg total) by mouth every 8 (eight) hours as needed for pain. For pain 30 tablet 2   lamoTRIgine (LAMICTAL) 200 MG tablet Take 1 tablet (200 mg total) by mouth at bedtime. 30 tablet 1   meloxicam (MOBIC) 15 MG tablet Take 1 tablet (15 mg total) by mouth daily. One tab after meal 30 tablet 11   Multiple Vitamin (MULTIVITAMIN WITH  MINERALS) TABS Take 1 tablet by mouth daily.     pantoprazole (PROTONIX) 40 MG tablet Take 1 tablet (40 mg total) by mouth daily. 30 tablet 0   polyethylene glycol (MIRALAX / GLYCOLAX) packet Take 17 g by mouth daily.     promethazine (PHENERGAN) 25 MG tablet Take 1 tablet (25 mg total) by mouth 3 (three) times daily as needed for nausea or vomiting. 90 tablet 1   sertraline (ZOLOFT) 50 MG tablet Take 1.5 tablets (75 mg total) by mouth daily. 45 tablet 1   sucralfate (CARAFATE) 1 g tablet Take 1 tablet (1 g total) by mouth 4 (four) times daily -  with meals and at bedtime. 30 tablet 0   No current facility-administered medications for this visit.     Psychiatric Specialty Exam: Please take into account limitations in obtaining a full mental status in the context of phone communication Review of Systems  All other systems reviewed and are negative.   Last menstrual period 08/25/2012.There is no height or weight on file to calculate BMI.  General Appearance: NA  Eye Contact:  NA  Speech:  Clear and Coherent  Volume:  Normal  Mood: rshe describes partially improved mood and her affect appears improved, fuller in range   Affect:  more reactive    Thought Process:  Goal Directed  Orientation:  Full (Time, Place, and Person)  Thought Content: Denies suicidal ideations . No HI. No hallucinations. No delusions expressed   Suicidal Thoughts:  No she denies any suicidal thoughts / no HI   Homicidal Thoughts:  No  Memory:   Grossly intact   Judgement:  Good  Insight:  Fair  Psychomotor Activity:  NA  Concentration: grossly intact   Recall:  Good  Fund of Knowledge: Good  Language: Good  Akathisia:  Negative  Handed:  Right  AIMS (if indicated): not done  Assets:  Communication Skills Desire for Improvement Financial Resources/Insurance Housing Social Support Talents/Skills  ADL's:  Intact  Cognition: WNL  Sleep:  Fair    Assessment and Plan: 46 yo female with a history of chronic depression and anxiety.  She has been diagnosed with MDD and GAD.  Reviewed history, does not endorse any clear history of mania or hypomania.  Presents with/reports partially improved mood and it appears Zoloft trial has been helpful thus far . Initially had some GI symptoms associated , but currently tolerating well . She describes partially improved mood, less anhedonia, improved energy level . She also appears less anxious overall.  No SI . No psychotic symptoms. She does report she continues to have some difficulty sleeping , but once she falls asleep sleeps until late next AM.  Reviewed options  Agrees to continue Zoloft titration , would increase to 100 mgr QDAY  Continue to taper Wellbutrin down from 150 mgr QDAY currently to 100 mgr QDAY  Decrease Valium ( which she reports she takes regularly at 10 mgr BID, denies misusing or abusing ) to 5 mgr TID and as possible continue to taper gradually .  We reviewed sleep hygiene techniques to include trying to set a specific time to bed and up, avoiding electronic/screens later at night , avoiding caffeinated sodas in the afternoons/night time .  She is currently in individual psychotherapy as well    Reviewed safety planning- patient reports family/husband are supportive .  and also agrees to go to ED or to [facility] if needed .     Dx: MDD with mixed features (vs bipolar  disorder, unspecified, depressed); GAD ; Bereavement   Plan:  Will continue regimen as below- Continue  Lamictal 200 mg daily for mood disorder  Decrease  to Wellbutrin SR 100 mgr QDAY for depression  Increase Zoloft to 100   mgr QDAY for depression, anxiety Change  Valium to 5 mgr TID PRN  for anxiety Next appointment in  about 4   weeks . Agrees to contact clinic sooner should to be any worsening or concern prior. She has Crisis Hotline phone number in case she needs it Agrees to go to ED if SI emerge or needs acute treatment  Continue individual psychotherapy     Jenne Campus, MD 07/31/2021, 3:38 PM  Patient ID: [place], female   DOB: 1975/11/07, 46 y.o.   MRN: [id number]

## 2021-08-15 ENCOUNTER — Ambulatory Visit (HOSPITAL_COMMUNITY): Payer: No Typology Code available for payment source | Admitting: Clinical

## 2021-08-28 ENCOUNTER — Ambulatory Visit (HOSPITAL_COMMUNITY): Payer: No Typology Code available for payment source | Admitting: Clinical

## 2021-08-29 ENCOUNTER — Telehealth (HOSPITAL_BASED_OUTPATIENT_CLINIC_OR_DEPARTMENT_OTHER): Payer: No Typology Code available for payment source | Admitting: Psychiatry

## 2021-08-29 ENCOUNTER — Encounter (HOSPITAL_COMMUNITY): Payer: Self-pay | Admitting: Psychiatry

## 2021-08-29 ENCOUNTER — Other Ambulatory Visit: Payer: Self-pay

## 2021-08-29 DIAGNOSIS — F411 Generalized anxiety disorder: Secondary | ICD-10-CM | POA: Diagnosis not present

## 2021-08-29 MED ORDER — DIAZEPAM 5 MG PO TABS: 5.0000 mg | ORAL_TABLET | Freq: Three times a day (TID) | ORAL | 1 refills | Status: DC | PRN

## 2021-08-29 MED ORDER — BUPROPION HCL ER (SR) 100 MG PO TB12: 100.0000 mg | ORAL_TABLET | Freq: Every day | ORAL | 1 refills | Status: DC

## 2021-08-29 MED ORDER — LAMOTRIGINE 200 MG PO TABS: 200.0000 mg | ORAL_TABLET | Freq: Every day | ORAL | 1 refills | Status: DC

## 2021-08-29 MED ORDER — SERTRALINE HCL 100 MG PO TABS: 150.0000 mg | ORAL_TABLET | Freq: Every day | ORAL | 1 refills | Status: DC

## 2021-08-29 NOTE — Progress Notes (Signed)
[place] MD/PA/NP OP Progress Note  08/29/2021 3:39 PM [name]  MRN:  [id number] Interview was conducted by phone and I verified that I was speaking with the correct person using two identifiers. I discussed the limitations of evaluation and management by telemedicine and  the availability of in person appointments.  Participants in the visit:  patient (location - home);  physician (location - clinic office). Duration 20 minutes  Chief Complaint:   Returns for medication management appointment  HPI: 46 yo married white female initially referred by her PCP for treatment of chronic depression and anxiety resistant to treatment. Reported history of chronic anxiety, depression over a period of years, with several antidepressant trials in the past that she did not feel worked.  She has no hx of mania, psychosis, alcohol or drug abuse.   Monica Ballard reports partial but noticeable improvement. States that her mood has been better and that anxiety has decreased . Agoraphobia has improved partially, and , for example, states she has been able to leave the home to accompany her daughter to orthopedic appointments, which in the past she had not been able to do. ( Of note, describes anxiety Monica Ballard as chronic, present since high school but worse over recent years )  Endorses improved energy level .  No suicidal ideations.  She presents with improved range of affect and noted to laugh at times during session. Thus far tolerating medications well . She reports Zoloft titration has been helpful thus far . She did have some GI symptoms initially, but these have subsided .  She describes her immediate family/husband as very supportive .              Visit Diagnosis:  No diagnosis found.  Past Psychiatric History: Please see intake H&P.  Past Medical History:  Past Medical History:  Diagnosis Date   Anemia    Anxiety    Asthma    smoker-uses nebulizer-not used in last year, albuterol rescue  inhaler used in 11/13 preoperatively   Chronic bronchitis (HCC)    nebulizer and rescue inhaler   Depression    GERD (gastroesophageal reflux disease)    occasional -tums   Headache(784.0)    Hearing loss in left ear    Hypercholesteremia    Hyperlipidemia    Missed ab 01/09/2012   10weeks-used cytotec   Numbness and tingling    PONV (postoperative nausea and vomiting)    Preterm labor    Teeth decayed    lower , nothing loose-encouraged pt to seek dental care if necessary    Past Surgical History:  Procedure Laterality Date   CHOLECYSTECTOMY, LAPAROSCOPIC  12/06/2010   DILATION AND CURETTAGE OF UTERUS  06/11/2012   Procedure: DILATATION AND CURETTAGE;  Surgeon: Thurnell Lose, MD;  Location: [facility];  Service: Gynecology;  Laterality: N/A;  sec procedure start  [place] [facility]  01/16/2012   Procedure: DILATATION AND EVACUATION;  Surgeon: Thurnell Lose, MD;  Location: [facility];  Service: Gynecology;  Laterality: N/A;   LAPAROSCOPY  06/11/2012   Procedure: LAPAROSCOPY OPERATIVE;  Surgeon: Thurnell Lose, MD;  Location: [facility];  Service: Gynecology;  Laterality: N/A;   left ear drum rebuilt     x2   TYMPANOPLASTY Left 04/23/2016   Procedure: TYMPANOPLASTY LEFT EAR;  Surgeon: Izora Gala, MD;  Location: [facility];  Service: ENT;  Laterality: Left;   VAGINAL HYSTERECTOMY N/A 09/01/2012   Procedure: HYSTERECTOMY VAGINAL;  Surgeon: Thurnell Lose, MD;  Location:  [facility];  Service: Gynecology;  Laterality: N/A;    Family Psychiatric History: Reviewed.  Family History:  Family History  Problem Relation Age of Onset   Cancer Other        family hx   Diabetes Other        family hx   Depression Other        family hx    Coronary artery disease Mother        also MVD   Hyperlipidemia Mother    Hypertension Mother    Hypertension Father    Diabetes Father    Diabetes Maternal Grandmother    Colon cancer Paternal Grandmother    Breast cancer Maternal  Aunt    Anesthesia problems Neg Hx     Social History:  Social History   Socioeconomic History   Marital status: Married    Spouse name: Not on file   Number of children: 2   Years of education: GED   Highest education level: Not on file  Occupational History   Occupation: Housewife  Tobacco Use   Smoking status: Former    Packs/day: 0.25    Years: 8.00    Pack years: 2.00    Types: Cigarettes   Smokeless tobacco: Never   Tobacco comments:    up to 1/2 ppl x 8 years  Substance and Sexual Activity   Alcohol use: No   Drug use: No   Sexual activity: Yes    Birth control/protection: Surgical  Other Topics Concern   Not on file  Social History Narrative   Lives with daughter, son and husband in a one story home.  Has 2 children.  Does not work at this time.  Education: GED.    Right-handed.   4-5 twelve ounce cans per day.   Social Determinants of Health   Financial Resource Strain: Not on file  Food Insecurity: Not on file  Transportation Needs: Not on file  Physical Activity: Not on file  Stress: Not on file  Social Connections: Not on file    Allergies:  Allergies  Allergen Reactions   Hydrocodone-Acetaminophen Hives and Swelling    Hives   Penicillins Hives    Metabolic Disorder Labs: No results found for: HGBA1C, MPG No results found for: PROLACTIN No results found for: CHOL, TRIG, HDL, CHOLHDL, VLDL, LDLCALC No results found for: TSH  Therapeutic Level Labs: No results found for: LITHIUM No results found for: VALPROATE No components found for:  CBMZ  Current Medications: Current Outpatient Medications  Medication Sig Dispense Refill   buPROPion ER (WELLBUTRIN SR) 100 MG 12 hr tablet Take 1 tablet (100 mg total) by mouth daily. 30 tablet 1   ciprofloxacin-dexamethasone (CIPRODEX) otic suspension Place 3 drops into the left ear 3 (three) times daily. 7.5 mL 2   diazepam (VALIUM) 5 MG tablet Take 1 tablet (5 mg total) by mouth every 8 (eight) hours  as needed for anxiety. 90 tablet 1   diclofenac sodium (VOLTAREN) 1 % GEL Apply 4 g topically 4 (four) times daily as needed. 100 g 11   dicyclomine (BENTYL) 10 MG capsule Take 10 mg by mouth 4 (four) times daily -  before meals and at bedtime.     ibuprofen (ADVIL,MOTRIN) 800 MG tablet Take 1 tablet (800 mg total) by mouth every 8 (eight) hours as needed for pain. For pain 30 tablet 2   lamoTRIgine (LAMICTAL) 200 MG tablet Take 1 tablet (200 mg total) by mouth at bedtime. 30 tablet  1   meloxicam (MOBIC) 15 MG tablet Take 1 tablet (15 mg total) by mouth daily. One tab after meal 30 tablet 11   Multiple Vitamin (MULTIVITAMIN WITH MINERALS) TABS Take 1 tablet by mouth daily.     pantoprazole (PROTONIX) 40 MG tablet Take 1 tablet (40 mg total) by mouth daily. 30 tablet 0   polyethylene glycol (MIRALAX / GLYCOLAX) packet Take 17 g by mouth daily.     promethazine (PHENERGAN) 25 MG tablet Take 1 tablet (25 mg total) by mouth 3 (three) times daily as needed for nausea or vomiting. 90 tablet 1   sertraline (ZOLOFT) 100 MG tablet Take 1 tablet (100 mg total) by mouth daily. 30 tablet 1   sucralfate (CARAFATE) 1 g tablet Take 1 tablet (1 g total) by mouth 4 (four) times daily -  with meals and at bedtime. 30 tablet 0   No current facility-administered medications for this visit.     Psychiatric Specialty Exam: Please take into account limitations in obtaining a full mental status in the context of phone communication Review of Systems  All other systems reviewed and are negative.   Last menstrual period 08/25/2012.There is no height or weight on file to calculate BMI.  General Appearance: NA  Eye Contact:  NA  Speech:  Clear and Coherent  Volume:  Normal  Mood: improving mood, reports she has been feeling " better"  Affect:  reactive , fuller in range  Thought Process:  Goal Directed  Orientation:  Full (Time, Place, and Person)  Thought Content: Denies suicidal ideations . No HI. No  hallucinations.   Suicidal Thoughts:  No no suicidal or self injurious ideations.   Homicidal Thoughts:  No  Memory:  Grossly intact   Judgement:  Good  Insight:  Fair  Psychomotor Activity:  NA  Concentration: grossly intact   Recall:  Good  Fund of Knowledge: Good  Language: Good  Akathisia:  Negative  Handed:  Right  AIMS (if indicated): not done  Assets:  Communication Skills Desire for Improvement Financial Resources/Insurance Housing Social Support Talents/Skills  ADL's:  Intact  Cognition: WNL  Sleep:  Fair    Assessment and Plan: 46 yo female with a history of chronic depression and anxiety.  She has been diagnosed with MDD and GAD.  Reviewed history, does not endorse any clear history of mania or hypomania.  Currently reports feeling better , with improved mood and less anxiety. Has history of agoraphobia but states that recently has been able to leave her home more easily and for example has been accompanying her daughter to medical appointments. She is presenting more conversant /communicative and exhibiting a fuller range of affect . No SI, future oriented .  Tolerating medications well at this time, appears to be responding well to Zoloft . Will titrate further as tolerated .     Agrees to go to ED or to [facility] if needed .     Dx: MDD with mixed features (vs bipolar disorder, unspecified, depressed); GAD ; Bereavement    Reviewed Plan as below  Increase Zoloft to 150  mgr QDAY for depression, anxiety Continue Wellbutrin  SR  100 mgr QDAY for depression  Continue Valium  5 mgr TID for anxiety  Continue individual psychotherapy   Will see in 4-6 weeks     Jenne Campus, MD 08/29/2021, 3:39 PM  Patient ID: Monica Ballard, female   DOB: 01/23/1976, 46 y.o.   MRN: [id number]

## 2021-09-01 ENCOUNTER — Ambulatory Visit (INDEPENDENT_AMBULATORY_CARE_PROVIDER_SITE_OTHER): Payer: No Typology Code available for payment source | Admitting: Clinical

## 2021-09-01 ENCOUNTER — Other Ambulatory Visit: Payer: Self-pay

## 2021-09-01 DIAGNOSIS — F411 Generalized anxiety disorder: Secondary | ICD-10-CM

## 2021-09-01 DIAGNOSIS — F331 Major depressive disorder, recurrent, moderate: Secondary | ICD-10-CM

## 2021-09-01 NOTE — Progress Notes (Signed)
° °  THERAPIST PROGRESS NOTE  Session Time: 10am  Participation Level: Active  Behavioral Response: Alertpleasant  Type of Therapy: Individual Therapy  Treatment Goals addressed: Anxiety  Interventions: CBT Virtual Visit via Telephone Note  I connected with Monica Ballard on 09/01/21 at 10:00 AM EST by telephone and verified that I am speaking with the correct person using two identifiers.  Location: Patient: home Provider: office   I discussed the limitations, risks, security and privacy concerns of performing an evaluation and management service by telephone and the availability of in person appointments. I also discussed with the patient that there may be a patient responsible charge related to this service. The patient expressed understanding and agreed to proceed.   I discussed the assessment and treatment plan with the patient. The patient was provided an opportunity to ask questions and all were answered. The patient agreed with the plan and demonstrated an understanding of the instructions.   The patient was advised to call back or seek an in-person evaluation if the symptoms worsen or if the condition fails to improve as anticipated.  I provided 45 minutes of non-face-to-face time during this encounter.  Summary: Pt reports she has been experiencing social isolation, low mood , ruminating thoughts and anxiety but says she has been trying to be more active. Pt says her daughter had surgery and she has spent time taking her to dr visits and nursing her to health. Pt says she has felt overwhelmed but has found deep breathing to be helpful to manage anxiety inducing situations. Additionally, pt admits not being consistent in practicing intervention discussed during session. Pt states when she experiences unwanted thoughts and emotions she finds ways to  avoid stressor. Pt continues to work toward achieving treatment goal and improving overall healtlh and wellness  Suicidal/Homicidal:  Pt denies SI/HI no plan, intent or attempt to harm self or others reported.  Therapist Response: Discussed with pt ways to build sustainable happiness(gratitude, positive journaling, acts of kindness). Prompted pt to identify consequences of inconsistency as it relates to her goals. Processed with pt paying attention to positive events throughout her day vs negative aspects.  Plan: Return again in 4 weeks.  Diagnosis: Axis I: Major depressive disorder, recurrent, moderate Generalized anxiety disorder    Axis II: No diagnosis    Suzan Slick, LCSW 09/01/2021

## 2021-10-06 ENCOUNTER — Ambulatory Visit (HOSPITAL_COMMUNITY): Payer: No Typology Code available for payment source | Admitting: Clinical

## 2021-10-09 ENCOUNTER — Telehealth (HOSPITAL_BASED_OUTPATIENT_CLINIC_OR_DEPARTMENT_OTHER): Payer: No Typology Code available for payment source | Admitting: Psychiatry

## 2021-10-09 ENCOUNTER — Encounter (HOSPITAL_COMMUNITY): Payer: Self-pay | Admitting: Psychiatry

## 2021-10-09 ENCOUNTER — Other Ambulatory Visit: Payer: Self-pay

## 2021-10-09 DIAGNOSIS — F330 Major depressive disorder, recurrent, mild: Secondary | ICD-10-CM

## 2021-10-09 MED ORDER — LAMOTRIGINE 200 MG PO TABS: 200.0000 mg | ORAL_TABLET | Freq: Every day | ORAL | 1 refills | Status: DC

## 2021-10-09 MED ORDER — SERTRALINE HCL 100 MG PO TABS: 150.0000 mg | ORAL_TABLET | Freq: Every day | ORAL | 1 refills | Status: DC

## 2021-10-09 MED ORDER — BUPROPION HCL ER (XL) 150 MG PO TB24: 150.0000 mg | ORAL_TABLET | ORAL | 1 refills | Status: DC

## 2021-10-09 MED ORDER — DIAZEPAM 5 MG PO TABS: 5.0000 mg | ORAL_TABLET | Freq: Three times a day (TID) | ORAL | 1 refills | Status: DC | PRN

## 2021-10-09 NOTE — Progress Notes (Signed)
BH MD/PA/NP OP Progress Note ? ?10/09/2021 3:01 PM ?Danton Sewer Grunwald  ?MRN:  [id number] ?Interview was conducted by phone and I verified that I was speaking with the correct person using two identifiers. I discussed the limitations of evaluation and management by telemedicine and  the availability of in person appointments.  ?Participants in the visit:  ?patient (location - home);  ?physician (location - clinic office). ?Duration 25 minutes ? ?Chief Complaint:   Returns for medication management appointment ? ?HPI: 46 yo married white female initially referred by her PCP for treatment of chronic depression and anxiety resistant to treatment. Reported history of chronic anxiety, depression over a period of years, with several antidepressant trials in the past that she did not feel worked.  She has no hx of mania, psychosis, alcohol or drug abuse.  ? ?Ms. Huntsman reports she has had some increased depression recently.  She thinks this may be in part due to recently hurting her foot while she was walking.  She reports she has needed to wear a boot and has upcoming appointment to review progress.  This injury has made it more difficult for her to leave home and remain physically active. ?Of note , reports she is not currently taking any prescribed analgesics or opiate analgesics to address foot pain, states she is taking acetaminophen as needed. ? ?Medications reviewed  ?Currently on Zoloft 150 mg daily (was titrated up from 100 mg daily), Wellbutrin SR 100 mg daily ( Wellbutrin was tapered down from 150 mgr QDAY ) . She also continues to take Lamictal and Valium .  ?Side effects reviewed. Thus far tolerating medications well, does endorse minor bowel habit changes :soft stools (but not diarrhea) which could be related to SSRI.  Tolerating Wellbutrin well/ no history of seizures . No rash reported (on lamotrigine ) ? ?Does not endorse significant neuro-vegetative symptoms . Mild anhedonia. Denies suicidal ideations and  presents future oriented . No psychotic symptoms noted or endorsed.  ? ?Agoraphobia has improved compared to prior , but describes its been more difficult to go out /be active due to foot injury.  ? ? ? ? ? ? ? ? ? ? ? ?Visit Diagnosis:  ?No diagnosis found. ? ?Past Psychiatric History: Please see intake H&P. ? ?Past Medical History:  ?Past Medical History:  ?Diagnosis Date  ? Anemia   ? Anxiety   ? Asthma   ? smoker-uses nebulizer-not used in last year, albuterol rescue inhaler used in 11/13 preoperatively  ? Chronic bronchitis ([place])   ? nebulizer and rescue inhaler  ? Depression   ? GERD (gastroesophageal reflux disease)   ? occasional -tums  ? Headache(784.0)   ? Hearing loss in left ear   ? Hypercholesteremia   ? Hyperlipidemia   ? Missed ab 01/09/2012  ? 10weeks-used cytotec  ? Numbness and tingling   ? PONV (postoperative nausea and vomiting)   ? Preterm labor   ? Teeth decayed   ? lower , nothing loose-encouraged pt to seek dental care if necessary  ?  ?Past Surgical History:  ?Procedure Laterality Date  ? CHOLECYSTECTOMY, LAPAROSCOPIC  12/06/2010  ? DILATION AND CURETTAGE OF UTERUS  06/11/2012  ? Procedure: DILATATION AND CURETTAGE;  Surgeon: Thurnell Lose, MD;  Location: [facility];  Service: Gynecology;  Laterality: N/A;  sec procedure start  1241  ? DILATION AND EVACUATION  01/16/2012  ? Procedure: DILATATION AND EVACUATION;  Surgeon: Thurnell Lose, MD;  Location: [facility];  Service: Gynecology;  Laterality: N/A;  ?  LAPAROSCOPY  06/11/2012  ? Procedure: LAPAROSCOPY OPERATIVE;  Surgeon: Geryl RankinsEvelyn Varnado, MD;  Location: [facility];  Service: Gynecology;  Laterality: N/A;  ? left ear drum rebuilt    ? x2  ? TYMPANOPLASTY Left 04/23/2016  ? Procedure: TYMPANOPLASTY LEFT EAR;  Surgeon: Serena ColonelJefry Rosen, MD;  Location: [facility];  Service: ENT;  Laterality: Left;  ? VAGINAL HYSTERECTOMY N/A 09/01/2012  ? Procedure: HYSTERECTOMY VAGINAL;  Surgeon: Geryl RankinsEvelyn Varnado, MD;  Location: [facility];  Service: Gynecology;   Laterality: N/A;  ? ? ?Family Psychiatric History: Reviewed. ? ?Family History:  ?Family History  ?Problem Relation Age of Onset  ? Cancer Other   ?     family hx  ? Diabetes Other   ?     family hx  ? Depression Other   ?     family hx   ? Coronary artery disease Mother   ?     also MVD  ? Hyperlipidemia Mother   ? Hypertension Mother   ? Hypertension Father   ? Diabetes Father   ? Diabetes Maternal Grandmother   ? Colon cancer Paternal Grandmother   ? Breast cancer Maternal Aunt   ? Anesthesia problems Neg Hx   ? ? ?Social History:  ?Social History  ? ?Socioeconomic History  ? Marital status: Married  ?  Spouse name: Not on file  ? Number of children: 2  ? Years of education: GED  ? Highest education level: Not on file  ?Occupational History  ? Occupation: Housewife  ?Tobacco Use  ? Smoking status: Former  ?  Packs/day: 0.25  ?  Years: 8.00  ?  Pack years: 2.00  ?  Types: Cigarettes  ? Smokeless tobacco: Never  ? Tobacco comments:  ?  up to 1/2 ppl x 8 years  ?Substance and Sexual Activity  ? Alcohol use: No  ? Drug use: No  ? Sexual activity: Yes  ?  Birth control/protection: Surgical  ?Other Topics Concern  ? Not on file  ?Social History Narrative  ? Lives with daughter, son and husband in a one story home.  Has 2 children.  Does not work at this time.  Education: GED.   ? Right-handed.  ? 4-5 twelve ounce cans per day.  ? ?Social Determinants of Health  ? ?Financial Resource Strain: Not on file  ?Food Insecurity: Not on file  ?Transportation Needs: Not on file  ?Physical Activity: Not on file  ?Stress: Not on file  ?Social Connections: Not on file  ? ? ?Allergies:  ?Allergies  ?Allergen Reactions  ? Hydrocodone-Acetaminophen Hives and Swelling  ?  Hives  ? Penicillins Hives  ? ? ?Metabolic Disorder Labs: ?No results found for: HGBA1C, MPG ?No results found for: PROLACTIN ?No results found for: CHOL, TRIG, HDL, CHOLHDL, VLDL, LDLCALC ?No results found for: TSH ? ?Therapeutic Level Labs: ?No results found for:  LITHIUM ?No results found for: VALPROATE ?No components found for:  CBMZ ? ?Current Medications: ?Current Outpatient Medications  ?Medication Sig Dispense Refill  ? buPROPion ER (WELLBUTRIN SR) 100 MG 12 hr tablet Take 1 tablet (100 mg total) by mouth daily. 30 tablet 1  ? diazepam (VALIUM) 5 MG tablet Take 1 tablet (5 mg total) by mouth every 8 (eight) hours as needed for anxiety. 90 tablet 1  ? dicyclomine (BENTYL) 10 MG capsule Take 10 mg by mouth 4 (four) times daily -  before meals and at bedtime.    ? lamoTRIgine (LAMICTAL) 200 MG tablet Take  1 tablet (200 mg total) by mouth at bedtime. 30 tablet 1  ? Multiple Vitamin (MULTIVITAMIN WITH MINERALS) TABS Take 1 tablet by mouth daily.    ? pantoprazole (PROTONIX) 40 MG tablet Take 1 tablet (40 mg total) by mouth daily. 30 tablet 0  ? polyethylene glycol (MIRALAX / GLYCOLAX) packet Take 17 g by mouth daily.    ? promethazine (PHENERGAN) 25 MG tablet Take 1 tablet (25 mg total) by mouth 3 (three) times daily as needed for nausea or vomiting. 90 tablet 1  ? sertraline (ZOLOFT) 100 MG tablet Take 1.5 tablets (150 mg total) by mouth daily. 45 tablet 1  ? sucralfate (CARAFATE) 1 g tablet Take 1 tablet (1 g total) by mouth 4 (four) times daily -  with meals and at bedtime. 30 tablet 0  ? ?No current facility-administered medications for this visit.  ? ? ? ?Psychiatric Specialty Exam: Please take into account limitations in obtaining a full mental status in the context of phone communication ?Review of Systems  ?All other systems reviewed and are negative. ?  ?Last menstrual period 08/25/2012.There is no height or weight on file to calculate BMI.  ?General Appearance: NA  ?Eye Contact:  NA  ?Speech:  Clear and Coherent  ?Volume:  Normal  ?Mood: reports has felt more depressed recently   ?Affect:  remains reactive , full in range   ?Thought Process:  Goal Directed  ?Orientation:  Full (Time, Place, and Person)  ?Thought Content: Denies suicidal ideations , and presents  future oriented. No HI. No hallucinations. No delusions expressed  ?Suicidal Thoughts:  No no suicidal or self injurious ideations.   ?Homicidal Thoughts:  No  ?Memory:  Grossly intact   ?Judgement:  Good  ?Insight:  F

## 2021-11-06 ENCOUNTER — Encounter (HOSPITAL_COMMUNITY): Payer: Self-pay | Admitting: Psychiatry

## 2021-11-06 ENCOUNTER — Ambulatory Visit (HOSPITAL_COMMUNITY): Payer: No Typology Code available for payment source | Admitting: Psychiatry

## 2021-11-06 VITALS — BP 124/85 | HR 76 | Resp 18

## 2021-11-06 DIAGNOSIS — F331 Major depressive disorder, recurrent, moderate: Secondary | ICD-10-CM

## 2021-11-06 MED ORDER — BUPROPION HCL ER (XL) 150 MG PO TB24: 150.0000 mg | ORAL_TABLET | ORAL | 1 refills | Status: DC

## 2021-11-06 MED ORDER — DIAZEPAM 5 MG PO TABS: 5.0000 mg | ORAL_TABLET | Freq: Three times a day (TID) | ORAL | 1 refills | Status: DC | PRN

## 2021-11-06 MED ORDER — SERTRALINE HCL 100 MG PO TABS: 200.0000 mg | ORAL_TABLET | Freq: Every day | ORAL | 1 refills | Status: DC

## 2021-11-06 NOTE — Progress Notes (Signed)
BH MD/PA/NP OP Progress Note ? ?11/06/2021 3:11 PM ?Monica CoyerKelly R Fishman  ?MRN:  [id number] ?This was a face-to-face, in office visit. ? ?Duration 25 minutes ? ?Chief Complaint:   Returns for medication management appointment ? ?HPI: 46yo married white female initially referred by her PCP for treatment of chronic depression and anxiety resistant to treatment. Reported history of chronic anxiety, depression over a period of years, with several antidepressant trials in the past that she did not feel worked.  She has no hx of mania, psychosis, alcohol or drug abuse.  ? ?[name] reports she has been functioning well in daily activities, mainly focused on her home/family.   ?She does report some persistent depression and describes mild but persistent sense of anhedonia and low energy level. She denies having any suicidal ideations and presents future oriented . Denies hallucinations and no psychotic symptoms are noted or endorsed . ? ?She reports medications ( Wellbutrin/Zoloft ) have been well tolerated / no side effects. ? ?Reviewed psychiatric medication genetic testing done in 2021 . ? ? ?Visit Diagnosis:  ?No diagnosis found. ? ?Past Psychiatric History: Please see intake H&P. ? ?Past Medical History:  ?Past Medical History:  ?Diagnosis Date  ? Anemia   ? Anxiety   ? Asthma   ? smoker-uses nebulizer-not used in last year, albuterol rescue inhaler used in 11/13 preoperatively  ? Chronic bronchitis ([facility])   ? nebulizer and rescue inhaler  ? Depression   ? GERD (gastroesophageal reflux disease)   ? occasional -tums  ? Headache(784.0)   ? Hearing loss in left ear   ? Hypercholesteremia   ? Hyperlipidemia   ? Missed ab 01/09/2012  ? 10weeks-used cytotec  ? Numbness and tingling   ? PONV (postoperative nausea and vomiting)   ? Preterm labor   ? Teeth decayed   ? lower , nothing loose-encouraged pt to seek dental care if necessary  ?  ?Past Surgical History:  ?Procedure Laterality Date  ? CHOLECYSTECTOMY, LAPAROSCOPIC  12/06/2010   ? DILATION AND CURETTAGE OF UTERUS  06/11/2012  ? Procedure: DILATATION AND CURETTAGE;  Surgeon: Monica RankinsEvelyn Varnado, MD;  Location: [facility];  Service: Gynecology;  Laterality: N/A;  sec procedure start  1241  ? DILATION AND EVACUATION  01/16/2012  ? Procedure: DILATATION AND EVACUATION;  Surgeon: Monica RankinsEvelyn Varnado, MD;  Location: [facility];  Service: Gynecology;  Laterality: N/A;  ? LAPAROSCOPY  06/11/2012  ? Procedure: LAPAROSCOPY OPERATIVE;  Surgeon: Monica RankinsEvelyn Varnado, MD;  Location: [facility];  Service: Gynecology;  Laterality: N/A;  ? left ear drum rebuilt    ? x2  ? TYMPANOPLASTY Left 04/23/2016  ? Procedure: TYMPANOPLASTY LEFT EAR;  Surgeon: Monica ColonelJefry Rosen, MD;  Location: [facility];  Service: ENT;  Laterality: Left;  ? VAGINAL HYSTERECTOMY N/A 09/01/2012  ? Procedure: HYSTERECTOMY VAGINAL;  Surgeon: Monica RankinsEvelyn Varnado, MD;  Location: [facility];  Service: Gynecology;  Laterality: N/A;  ? ? ?Family Psychiatric History: Reviewed. ? ?Family History:  ?Family History  ?Problem Relation Age of Onset  ? Cancer Other   ?     family hx  ? Diabetes Other   ?     family hx  ? Depression Other   ?     family hx   ? Coronary artery disease Mother   ?     also MVD  ? Hyperlipidemia Mother   ? Hypertension Mother   ? Hypertension Father   ? Diabetes Father   ? Diabetes Maternal Grandmother   ? Colon  cancer Paternal Grandmother   ? Breast cancer Maternal Aunt   ? Anesthesia problems Neg Hx   ? ? ?Social History:  ?Social History  ? ?Socioeconomic History  ? Marital status: Married  ?  Spouse name: Not on file  ? Number of children: 2  ? Years of education: GED  ? Highest education level: Not on file  ?Occupational History  ? Occupation: Housewife  ?Tobacco Use  ? Smoking status: Former  ?  Packs/day: 0.25  ?  Years: 8.00  ?  Pack years: 2.00  ?  Types: Cigarettes  ? Smokeless tobacco: Never  ? Tobacco comments:  ?  up to 1/2 ppl x 8 years  ?Substance and Sexual Activity  ? Alcohol use: No  ? Drug use: No  ? Sexual activity: Yes  ?  Birth  control/protection: Surgical  ?Other Topics Concern  ? Not on file  ?Social History Narrative  ? Lives with daughter, son and husband in a one story home.  Has 2 children.  Does not work at this time.  Education: GED.   ? Right-handed.  ? 4-5 twelve ounce cans per day.  ? ?Social Determinants of Health  ? ?Financial Resource Strain: Not on file  ?Food Insecurity: Not on file  ?Transportation Needs: Not on file  ?Physical Activity: Not on file  ?Stress: Not on file  ?Social Connections: Not on file  ? ? ?Allergies:  ?Allergies  ?Allergen Reactions  ? Hydrocodone-Acetaminophen Hives and Swelling  ?  Hives  ? Penicillins Hives  ? ? ?Metabolic Disorder Labs: ?No results found for: HGBA1C, MPG ?No results found for: PROLACTIN ?No results found for: CHOL, TRIG, HDL, CHOLHDL, VLDL, LDLCALC ?No results found for: TSH ? ?Therapeutic Level Labs: ?No results found for: LITHIUM ?No results found for: VALPROATE ?No components found for:  CBMZ ? ?Current Medications: ?Current Outpatient Medications  ?Medication Sig Dispense Refill  ? buPROPion (WELLBUTRIN XL) 150 MG 24 hr tablet Take 1 tablet (150 mg total) by mouth every morning. 30 tablet 1  ? diazepam (VALIUM) 5 MG tablet Take 1 tablet (5 mg total) by mouth every 8 (eight) hours as needed for anxiety. 90 tablet 1  ? dicyclomine (BENTYL) 10 MG capsule Take 10 mg by mouth 4 (four) times daily -  before meals and at bedtime.    ? lamoTRIgine (LAMICTAL) 200 MG tablet Take 1 tablet (200 mg total) by mouth at bedtime. 30 tablet 1  ? Multiple Vitamin (MULTIVITAMIN WITH MINERALS) TABS Take 1 tablet by mouth daily.    ? pantoprazole (PROTONIX) 40 MG tablet Take 1 tablet (40 mg total) by mouth daily. 30 tablet 0  ? polyethylene glycol (MIRALAX / GLYCOLAX) packet Take 17 g by mouth daily.    ? promethazine (PHENERGAN) 25 MG tablet Take 1 tablet (25 mg total) by mouth 3 (three) times daily as needed for nausea or vomiting. 90 tablet 1  ? sertraline (ZOLOFT) 100 MG tablet Take 1.5 tablets  (150 mg total) by mouth daily. 45 tablet 1  ? sucralfate (CARAFATE) 1 g tablet Take 1 tablet (1 g total) by mouth 4 (four) times daily -  with meals and at bedtime. 30 tablet 0  ? ?No current facility-administered medications for this visit.  ? ? ? ?Psychiatric Specialty Exam:  ?Review of Systems  ?All other systems reviewed and are negative. ?  ?Last menstrual period 08/25/2012.There is no height or weight on file to calculate BMI.  ?General Appearance: well groomed, no agitation or  restlessness, calm, cooperative, makes good eye contact   ?Eye Contact:  good eye contact   ?Speech:  Clear and Coherent  ?Volume:  Normal  ?Mood: reports mood " about the same ", vaguely depressed   ?Affect:  vaguely constricted, but does smile at times during session  ?Thought Process:  Goal Directed  ?Orientation:  Full (Time, Place, and Person)  ?Thought Content: Denies suicidal ideations , and presents future oriented. No HI. No hallucinations. No delusions expressed  ?Suicidal Thoughts:  No  denies having any suicidal or self injurious ideations.   ?Homicidal Thoughts:  No  ?Memory:  Grossly intact   ?Judgement:  Good  ?Insight:  Fair  ?Psychomotor Activity:  NA  ?Concentration: grossly intact   ?Recall:  Good  ?Fund of Knowledge: Good  ?Language: Good  ?Akathisia:  Negative  ?Handed:  Right  ?AIMS (if indicated): not done  ?Assets:  Communication Skills ?Desire for Improvement ?Financial Resources/Insurance ?Housing ?Social Support ?Talents/Skills  ?ADL's:  Intact  ?Cognition: WNL  ?Sleep:  Fair  ? ? ?Assessment and Plan: 46yo female with a history of chronic depression and anxiety.  She has been diagnosed with MDD and GAD.  Reviewed history, does not endorse any clear history of mania or hypomania. ? ?Patient reports history of chronic depression.  She endorses some persistent depression and subjective feeling of anhedonia/low energy level although is functioning well in her daily activities/home life.  She denies having any  suicidal or self-injurious ideations.  There are no psychotic symptoms.  She is currently on a combination of Zoloft/Wellbutrin which she is tolerating well.  Denies side effects.  We discussed options, t

## 2021-11-10 ENCOUNTER — Other Ambulatory Visit: Payer: Self-pay | Admitting: Orthopedic Surgery

## 2021-11-10 DIAGNOSIS — M5416 Radiculopathy, lumbar region: Secondary | ICD-10-CM

## 2021-11-23 ENCOUNTER — Other Ambulatory Visit (HOSPITAL_COMMUNITY): Payer: Self-pay | Admitting: Psychiatry

## 2021-12-05 ENCOUNTER — Other Ambulatory Visit (HOSPITAL_COMMUNITY): Payer: Self-pay | Admitting: *Deleted

## 2021-12-05 ENCOUNTER — Encounter (HOSPITAL_COMMUNITY): Payer: Self-pay | Admitting: Psychiatry

## 2021-12-05 ENCOUNTER — Telehealth (HOSPITAL_BASED_OUTPATIENT_CLINIC_OR_DEPARTMENT_OTHER): Payer: No Typology Code available for payment source | Admitting: Psychiatry

## 2021-12-05 DIAGNOSIS — F324 Major depressive disorder, single episode, in partial remission: Secondary | ICD-10-CM

## 2021-12-05 DIAGNOSIS — F325 Major depressive disorder, single episode, in full remission: Secondary | ICD-10-CM | POA: Diagnosis not present

## 2021-12-05 MED ORDER — SERTRALINE HCL 100 MG PO TABS: 200.0000 mg | ORAL_TABLET | Freq: Every day | ORAL | 1 refills | Status: DC

## 2021-12-05 MED ORDER — LAMOTRIGINE 200 MG PO TABS: 200.0000 mg | ORAL_TABLET | Freq: Every day | ORAL | 1 refills | Status: DC

## 2021-12-05 MED ORDER — DIAZEPAM 5 MG PO TABS: 5.0000 mg | ORAL_TABLET | Freq: Three times a day (TID) | ORAL | 1 refills | Status: DC | PRN

## 2021-12-05 MED ORDER — BUPROPION HCL ER (XL) 300 MG PO TB24: 300.0000 mg | ORAL_TABLET | ORAL | 1 refills | Status: DC

## 2021-12-05 NOTE — Progress Notes (Signed)
BH MD/PA/NP OP Progress Note ? ?12/05/2021 10:32 AM ?Monica Ballard  ?MRN:  [id number] ?This was a phone based medication management appointment.  Patient's identity verified using 2 different identifiers.  Limitations associated with this type of communication have been reviewed. ? ?Location of parties ?Patient-Home ?Clinician-[facility]  ? ?Duration 25 minutes ? ?Chief Complaint:   Returns for medication management appointment ? ?HPI: 46yo married white female initially referred by her PCP for treatment of chronic depression and anxiety resistant to treatment. Reported history of chronic anxiety, depression over a period of years, with several antidepressant trials in the past that she did not feel worked.  She has no hx of mania, psychosis, alcohol or drug abuse.  ? ?She reports she is functioning " all right" in daily activities. Describes stable and supportive family relationships. ?She does report some persistent symptoms of depression, to include a vague sense of low energy level and some persistent anhedonia . States she prefers to stay at home and avoids leaving the house unless needed . She describes anxiety is generally well controlled in home environment but tends to feel more anxious in public situations.  ?Denies any suicidal ideations . No psychotic symptoms noted or endorsed. ? ?Denies medication side effects- Currently on Valium 5 mgr TID , which she reports she takes regularly, denies any BZD misuse or abuse . On Zoloft, which was increased to 200 mgr QDAY last month. Denies side effects and is tolerating current dose well , states dose increase may have helped " a little bit". On Wellbutrin XL, currently at 150 mgr QDAY ,which she reports she has been for a long time without side effects. Denies any history of seizures or head trauma. On Lamictal 200 mgr QDAY . Denies side effects or rash . ? ?We reviewed treatment options. ?Agrees to Wellbutrin XL dose titration . ?We also reviewed adding low dose  Abilify as augmentation strategy. She expresses concern about possible side effects , prefers not to add new medication at this time.  ?Does express interest in [facility] referral for management of persistent depression.  ? ? ? ?Visit Diagnosis:  ?No diagnosis found. ? ?Past Psychiatric History: Please see intake H&P. ? ?Past Medical History:  ?Past Medical History:  ?Diagnosis Date  ? Anemia   ? Anxiety   ? Asthma   ? smoker-uses nebulizer-not used in last year, albuterol rescue inhaler used in 11/13 preoperatively  ? Chronic bronchitis (HCC)   ? nebulizer and rescue inhaler  ? Depression   ? GERD (gastroesophageal reflux disease)   ? occasional -tums  ? Headache(784.0)   ? Hearing loss in left ear   ? Hypercholesteremia   ? Hyperlipidemia   ? Missed ab 01/09/2012  ? 10weeks-used cytotec  ? Numbness and tingling   ? PONV (postoperative nausea and vomiting)   ? Preterm labor   ? Teeth decayed   ? lower , nothing loose-encouraged pt to seek dental care if necessary  ?  ?Past Surgical History:  ?Procedure Laterality Date  ? CHOLECYSTECTOMY, LAPAROSCOPIC  12/06/2010  ? DILATION AND CURETTAGE OF UTERUS  06/11/2012  ? Procedure: DILATATION AND CURETTAGE;  Surgeon: Geryl Rankins, MD;  Location: [facility];  Service: Gynecology;  Laterality: N/A;  sec procedure start  1241  ? DILATION AND EVACUATION  01/16/2012  ? Procedure: DILATATION AND EVACUATION;  Surgeon: Geryl Rankins, MD;  Location: [facility];  Service: Gynecology;  Laterality: N/A;  ? LAPAROSCOPY  06/11/2012  ? Procedure: LAPAROSCOPY OPERATIVE;  Surgeon: Renea Ee  Dion BodyVarnado, MD;  Location: [facility];  Service: Gynecology;  Laterality: N/A;  ? left ear drum rebuilt    ? x2  ? TYMPANOPLASTY Left 04/23/2016  ? Procedure: TYMPANOPLASTY LEFT EAR;  Surgeon: Serena ColonelJefry Rosen, MD;  Location: [facility];  Service: ENT;  Laterality: Left;  ? VAGINAL HYSTERECTOMY N/A 09/01/2012  ? Procedure: HYSTERECTOMY VAGINAL;  Surgeon: Geryl RankinsEvelyn Varnado, MD;  Location: [facility];  Service: Gynecology;   Laterality: N/A;  ? ? ?Family Psychiatric History: Reviewed. ? ?Family History:  ?Family History  ?Problem Relation Age of Onset  ? Cancer Other   ?     family hx  ? Diabetes Other   ?     family hx  ? Depression Other   ?     family hx   ? Coronary artery disease Mother   ?     also MVD  ? Hyperlipidemia Mother   ? Hypertension Mother   ? Hypertension Father   ? Diabetes Father   ? Diabetes Maternal Grandmother   ? Colon cancer Paternal Grandmother   ? Breast cancer Maternal Aunt   ? Anesthesia problems Neg Hx   ? ? ?Social History:  ?Social History  ? ?Socioeconomic History  ? Marital status: Married  ?  Spouse name: Not on file  ? Number of children: 2  ? Years of education: GED  ? Highest education level: Not on file  ?Occupational History  ? Occupation: Housewife  ?Tobacco Use  ? Smoking status: Former  ?  Packs/day: 0.25  ?  Years: 8.00  ?  Pack years: 2.00  ?  Types: Cigarettes  ? Smokeless tobacco: Never  ? Tobacco comments:  ?  up to 1/2 ppl x 8 years  ?Substance and Sexual Activity  ? Alcohol use: No  ? Drug use: No  ? Sexual activity: Yes  ?  Birth control/protection: Surgical  ?Other Topics Concern  ? Not on file  ?Social History Narrative  ? Lives with daughter, son and husband in a one story home.  Has 2 children.  Does not work at this time.  Education: GED.   ? Right-handed.  ? 4-5 twelve ounce cans per day.  ? ?Social Determinants of Health  ? ?Financial Resource Strain: Not on file  ?Food Insecurity: Not on file  ?Transportation Needs: Not on file  ?Physical Activity: Not on file  ?Stress: Not on file  ?Social Connections: Not on file  ? ? ?Allergies:  ?Allergies  ?Allergen Reactions  ? Hydrocodone-Acetaminophen Hives and Swelling  ?  Hives  ? Penicillins Hives  ? ? ?Metabolic Disorder Labs: ?No results found for: HGBA1C, MPG ?No results found for: PROLACTIN ?No results found for: CHOL, TRIG, HDL, CHOLHDL, VLDL, LDLCALC ?No results found for: TSH ? ?Therapeutic Level Labs: ?No results found for:  LITHIUM ?No results found for: VALPROATE ?No components found for:  CBMZ ? ?Current Medications: ?Current Outpatient Medications  ?Medication Sig Dispense Refill  ? buPROPion (WELLBUTRIN XL) 150 MG 24 hr tablet Take 1 tablet (150 mg total) by mouth every morning. 30 tablet 1  ? diazepam (VALIUM) 5 MG tablet Take 1 tablet (5 mg total) by mouth every 8 (eight) hours as needed for anxiety. 90 tablet 1  ? dicyclomine (BENTYL) 10 MG capsule Take 10 mg by mouth 4 (four) times daily -  before meals and at bedtime.    ? lamoTRIgine (LAMICTAL) 200 MG tablet Take 1 tablet (200 mg total) by mouth at bedtime. 30 tablet  1  ? Multiple Vitamin (MULTIVITAMIN WITH MINERALS) TABS Take 1 tablet by mouth daily.    ? pantoprazole (PROTONIX) 40 MG tablet Take 1 tablet (40 mg total) by mouth daily. 30 tablet 0  ? polyethylene glycol (MIRALAX / GLYCOLAX) packet Take 17 g by mouth daily.    ? promethazine (PHENERGAN) 25 MG tablet Take 1 tablet (25 mg total) by mouth 3 (three) times daily as needed for nausea or vomiting. 90 tablet 1  ? sertraline (ZOLOFT) 100 MG tablet Take 2 tablets (200 mg total) by mouth daily. 60 tablet 1  ? sucralfate (CARAFATE) 1 g tablet Take 1 tablet (1 g total) by mouth 4 (four) times daily -  with meals and at bedtime. 30 tablet 0  ? ?No current facility-administered medications for this visit.  ? ? ? ?Psychiatric Specialty Exam: Please note limitations in obtaining a full mental status exam in the context of phone communication ?ROS - none endorsed ?  ?Last menstrual period 08/25/2012.There is no height or weight on file to calculate BMI.  ?General Appearance: NA  ?Eye Contact:  NA    ?Speech:  Clear and Coherent  ?Volume:  Normal  ?Mood:  reports mood as " same", endorses some persistent depression  ?Affect:  vaguely constricted, does become more reactive as session progresses   ?Thought Process:  Goal Directed  ?Orientation:  Full (Time, Place, and Person)  ?Thought Content: Denies suicidal ideations/self  injurious ideations . No HI. No hallucinations reported or endorsed, no delusions are expressed   ?Suicidal Thoughts:  No  denies having any suicidal or self injurious ideations.   ?Homicidal Thoughts:  No  ?Me

## 2021-12-06 ENCOUNTER — Telehealth (HOSPITAL_COMMUNITY): Payer: Self-pay | Admitting: *Deleted

## 2021-12-06 NOTE — Telephone Encounter (Signed)
Writer called and had to leave VM advising pt that orders for TSH have been sent to [facility] @ [address] in [place]. Pt advised to take insurance card and I.D. and that this is a non-fasting lab. Encouraged pt to call office with any questions or concerns. ?

## 2021-12-12 ENCOUNTER — Telehealth (HOSPITAL_COMMUNITY): Payer: Self-pay

## 2021-12-12 NOTE — Telephone Encounter (Signed)
Janesha Services Coordinator called to schedule [place] consult. Left call back information on voicemail. Will try again later.

## 2021-12-13 ENCOUNTER — Telehealth (HOSPITAL_COMMUNITY): Payer: Self-pay

## 2021-12-13 NOTE — Telephone Encounter (Signed)
Aurielle Services Coordinator called to schedule [place] consult. Left call back information on voicemail. Will try again later

## 2021-12-19 ENCOUNTER — Ambulatory Visit
Admission: RE | Admit: 2021-12-19 | Discharge: 2021-12-19 | Disposition: A | Discharge status: Home / Self Care | Payer: No Typology Code available for payment source | Source: Ambulatory Visit | Attending: Orthopedic Surgery | Admitting: Orthopedic Surgery

## 2021-12-19 DIAGNOSIS — M5416 Radiculopathy, lumbar region: Secondary | ICD-10-CM

## 2021-12-23 LAB — TSH: TSH: 3.27 u[IU]/mL (ref 0.450–4.500)

## 2021-12-25 ENCOUNTER — Telehealth (HOSPITAL_COMMUNITY): Payer: Self-pay | Admitting: *Deleted

## 2021-12-25 NOTE — Telephone Encounter (Signed)
TSH level received from [facility]. Resulted on 12/23/2021.  TSH @ 3.270 uIU/ml

## 2022-01-15 ENCOUNTER — Encounter (HOSPITAL_COMMUNITY): Payer: Self-pay | Admitting: Psychiatry

## 2022-01-15 ENCOUNTER — Telehealth (HOSPITAL_BASED_OUTPATIENT_CLINIC_OR_DEPARTMENT_OTHER): Payer: No Typology Code available for payment source | Admitting: Psychiatry

## 2022-01-15 DIAGNOSIS — F324 Major depressive disorder, single episode, in partial remission: Secondary | ICD-10-CM | POA: Diagnosis not present

## 2022-01-15 MED ORDER — LAMOTRIGINE 200 MG PO TABS: 200.0000 mg | ORAL_TABLET | Freq: Every day | ORAL | 1 refills | Status: DC

## 2022-01-15 MED ORDER — BUPROPION HCL ER (XL) 300 MG PO TB24: 300.0000 mg | ORAL_TABLET | ORAL | 1 refills | Status: DC

## 2022-01-15 MED ORDER — SERTRALINE HCL 100 MG PO TABS: 200.0000 mg | ORAL_TABLET | Freq: Every day | ORAL | 1 refills | Status: DC

## 2022-01-15 MED ORDER — DIAZEPAM 5 MG PO TABS
5.0000 mg | ORAL_TABLET | Freq: Three times a day (TID) | ORAL | 1 refills | Status: DC | PRN
Start: 2022-01-15 — End: 2022-03-12

## 2022-02-10 ENCOUNTER — Telehealth (HOSPITAL_COMMUNITY): Payer: Self-pay | Admitting: Psychiatry

## 2022-02-26 ENCOUNTER — Telehealth (HOSPITAL_COMMUNITY): Payer: Self-pay | Admitting: Psychiatry

## 2022-03-12 ENCOUNTER — Telehealth (HOSPITAL_BASED_OUTPATIENT_CLINIC_OR_DEPARTMENT_OTHER): Payer: No Typology Code available for payment source | Admitting: Psychiatry

## 2022-03-12 ENCOUNTER — Encounter (HOSPITAL_COMMUNITY): Payer: Self-pay | Admitting: Psychiatry

## 2022-03-12 DIAGNOSIS — F341 Dysthymic disorder: Secondary | ICD-10-CM

## 2022-03-12 MED ORDER — LAMOTRIGINE 200 MG PO TABS: 200.0000 mg | ORAL_TABLET | Freq: Every day | ORAL | 1 refills | Status: DC

## 2022-03-12 MED ORDER — SERTRALINE HCL 100 MG PO TABS: 200.0000 mg | ORAL_TABLET | Freq: Every day | ORAL | 1 refills | Status: DC

## 2022-03-12 MED ORDER — DIAZEPAM 5 MG PO TABS
5.0000 mg | ORAL_TABLET | Freq: Three times a day (TID) | ORAL | 1 refills | Status: DC | PRN
Start: 2022-03-12 — End: 2022-04-26

## 2022-03-12 MED ORDER — BUPROPION HCL ER (XL) 300 MG PO TB24
300.0000 mg | ORAL_TABLET | ORAL | 1 refills | Status: DC
Start: 2022-03-12 — End: 2022-04-26

## 2022-03-12 NOTE — Progress Notes (Signed)
BH MD/PA/NP OP Progress Note  03/12/2022 9:10 AM Monica Ballard  MRN:  [id number] This was a phone based medication management appointment.  Patient's identity verified using 2 different identifiers.  Limitations associated with this type of communication have been reviewed.  Location of parties Patient-Home [facility]   Duration 25 minutes  Chief Complaint:   Returns for medication management appointment  HPI: 46 yo married white female initially referred by her PCP for treatment of chronic depression and anxiety resistant to treatment. Reported history of chronic anxiety, depression over a period of years, with several antidepressant trials in the past that she did not feel worked.  She has no hx of mania, psychosis, alcohol or drug abuse.   Reports she has been doing "about the same".  She does state that current medication regimen has been well-tolerated and generally more effective /helpful than previous medications.  Denies medication side effects at this time.  We reviewed past psychiatric history again today.  She has a long history of anxiety, dating back to childhood/adolescence.  Describes symptoms of social anxiety dating back to school, describing for example that she had severe anxiety when needing to speak in front of classmates were present in front of the classroom.  She describes history of panic attacks and agoraphobia.  She prefers to stay at home where she feels more comfortable and if going to public places (for example a store) she prefers to go with family member .  She also endorses a history of depression.   At this time reports persistent symptoms of agoraphobia.  She states she feels uncomfortable in stores or other public places, prefers to stay home, and sometimes also feels anxious when interacting with others, even members of her extended family.  She describes some persistent depressive symptoms such as a mild sense of anhedonia but in general states  that her mood has been stable and states that current medication regimen has been helpful and more effective than previous ones.  Affect presents reactive.  She does report positive and strong family relationships.  She is invested in her children and states her husband is very supportive.  For example, states that her daughter will go with her to the supermarket later today , as she feels less anxious when she goes with a family member.  She also states she benefits from interacting with her pet dogs .     Recently explored the possibility of referral to [facility] for further management.  She states that unfortunately she is unable to afford at this time.  Denies medication side effects.  We have reviewed side effect profile to include risk of seizures on Wellbutrin (denies history of seizures or head trauma) , potential risk of sedation and habituation on diazepam (denies any misuse), potential risk of severe rash/ SJS on lamotrigine.     Visit Diagnosis:  No diagnosis found.  Past Psychiatric History: Please see intake H&P.  Past Medical History:  Past Medical History:  Diagnosis Date   Anemia    Anxiety    Asthma    smoker-uses nebulizer-not used in last year, albuterol rescue inhaler used in 11/13 preoperatively   Chronic bronchitis (HCC)    nebulizer and rescue inhaler   Depression    GERD (gastroesophageal reflux disease)    occasional -tums   Headache(784.0)    Hearing loss in left ear    Hypercholesteremia    Hyperlipidemia    Missed ab 01/09/2012   10weeks-used cytotec   Numbness and  tingling    PONV (postoperative nausea and vomiting)    Preterm labor    Teeth decayed    lower , nothing loose-encouraged pt to seek dental care if necessary    Past Surgical History:  Procedure Laterality Date   CHOLECYSTECTOMY, LAPAROSCOPIC  12/06/2010   DILATION AND CURETTAGE OF UTERUS  06/11/2012   Procedure: DILATATION AND CURETTAGE;  Surgeon: Geryl Rankins, MD;  Location: [facility];   Service: Gynecology;  Laterality: N/A;  sec procedure start  1241   DILATION AND EVACUATION  01/16/2012   Procedure: DILATATION AND EVACUATION;  Surgeon: Geryl Rankins, MD;  Location: [facility];  Service: Gynecology;  Laterality: N/A;   LAPAROSCOPY  06/11/2012   Procedure: LAPAROSCOPY OPERATIVE;  Surgeon: Geryl Rankins, MD;  Location: [facility];  Service: Gynecology;  Laterality: N/A;   left ear drum rebuilt     x2   TYMPANOPLASTY Left 04/23/2016   Procedure: TYMPANOPLASTY LEFT EAR;  Surgeon: Serena Colonel, MD;  Location: [facility];  Service: ENT;  Laterality: Left;   VAGINAL HYSTERECTOMY N/A 09/01/2012   Procedure: HYSTERECTOMY VAGINAL;  Surgeon: Geryl Rankins, MD;  Location: [facility];  Service: Gynecology;  Laterality: N/A;    Family Psychiatric History: Reviewed.  Family History:  Family History  Problem Relation Age of Onset   Cancer Other        family hx   Diabetes Other        family hx   Depression Other        family hx    Coronary artery disease Mother        also MVD   Hyperlipidemia Mother    Hypertension Mother    Hypertension Father    Diabetes Father    Diabetes Maternal Grandmother    Colon cancer Paternal Grandmother    Breast cancer Maternal Aunt    Anesthesia problems Neg Hx     Social History:  Social History   Socioeconomic History   Marital status: Married    Spouse name: Not on file   Number of children: 2   Years of education: GED   Highest education level: Not on file  Occupational History   Occupation: Housewife  Tobacco Use   Smoking status: Former    Packs/day: 0.25    Years: 8.00    Total pack years: 2.00    Types: Cigarettes   Smokeless tobacco: Never   Tobacco comments:    up to 1/2 ppl x 8 years  Substance and Sexual Activity   Alcohol use: No   Drug use: No   Sexual activity: Yes    Birth control/protection: Surgical  Other Topics Concern   Not on file  Social History Narrative   Lives with daughter, son and  husband in a one story home.  Has 2 children.  Does not work at this time.  Education: GED.    Right-handed.   4-5 twelve ounce cans per day.   Social Determinants of Health   Financial Resource Strain: Not on file  Food Insecurity: Not on file  Transportation Needs: Not on file  Physical Activity: Not on file  Stress: Not on file  Social Connections: Not on file    Allergies:  Allergies  Allergen Reactions   Hydrocodone-Acetaminophen Hives and Swelling    Hives   Penicillins Hives    Metabolic Disorder Labs: No results found for: "HGBA1C", "MPG" No results found for: "PROLACTIN" No results found for: "CHOL", "TRIG", "HDL", "CHOLHDL", "VLDL", "LDLCALC" Lab Results  Component Value Date   TSH 3.270 12/22/2021    Therapeutic Level Labs: No results found for: "LITHIUM" No results found for: "VALPROATE" No results found for: "CBMZ"  Current Medications: Current Outpatient Medications  Medication Sig Dispense Refill   buPROPion (WELLBUTRIN XL) 300 MG 24 hr tablet Take 1 tablet (300 mg total) by mouth every morning. 30 tablet 1   diazepam (VALIUM) 5 MG tablet Take 1 tablet (5 mg total) by mouth every 8 (eight) hours as needed for anxiety. 90 tablet 1   dicyclomine (BENTYL) 10 MG capsule Take 10 mg by mouth 4 (four) times daily -  before meals and at bedtime.     lamoTRIgine (LAMICTAL) 200 MG tablet Take 1 tablet (200 mg total) by mouth at bedtime. 30 tablet 1   Multiple Vitamin (MULTIVITAMIN WITH MINERALS) TABS Take 1 tablet by mouth daily.     pantoprazole (PROTONIX) 40 MG tablet Take 1 tablet (40 mg total) by mouth daily. 30 tablet 0   polyethylene glycol (MIRALAX / GLYCOLAX) packet Take 17 g by mouth daily.     promethazine (PHENERGAN) 25 MG tablet Take 1 tablet (25 mg total) by mouth 3 (three) times daily as needed for nausea or vomiting. 90 tablet 1   sertraline (ZOLOFT) 100 MG tablet Take 2 tablets (200 mg total) by mouth daily. 60 tablet 1   sucralfate (CARAFATE) 1 g  tablet Take 1 tablet (1 g total) by mouth 4 (four) times daily -  with meals and at bedtime. 30 tablet 0   No current facility-administered medications for this visit.     Psychiatric Specialty Exam: Please note limitations in obtaining a full mental status exam in the context of phone communication ROS - none endorsed   Last menstrual period 08/25/2012.There is no height or weight on file to calculate BMI.  General Appearance: NA  Eye Contact:  NA    Speech:  Clear and Coherent  Volume:  Normal  Mood:  reports mood as "same", in general stable  Affect: Presents reactive  Thought Process:  Goal Directed  Orientation:  Full (Time, Place, and Person)  Thought Content: No hallucinations endorsed.  No delusions expressed.  Denies suicidal or self-injurious ideations and describes love Richarda Overlie of responsibility for her family as a protective factor.  Suicidal Thoughts:  No  no suicidal or self injurious ideations.   Homicidal Thoughts:  No no violent or homicidal ideations.  Memory:  Grossly intact   Judgement:  Good  Insight:  Fair/improving  Psychomotor Activity:  NA  Concentration: grossly intact   Recall:  Good  Fund of Knowledge: Good  Language: Good  Akathisia:  Negative  Handed:  Right  AIMS (if indicated): not done  Assets:  Communication Skills Desire for Improvement Financial Resources/Insurance Housing Social Support Talents/Skills  ADL's:  Intact  Cognition: WNL  Sleep:  Fair    Assessment and Plan: 46 yo female with a history of chronic depression and anxiety.  She has been diagnosed with MDD and GAD.  Reviewed history, does not endorse any clear history of mania or hypomania.  Endorses long history of anxiety symptoms dating back to adolescence, to include symptoms of social anxiety and agoraphobia.  Reports feeling "the same".  She is functioning well in daily activities.  Describes some depressive symptoms, such as a vague sense of anhedonia.  Denies any  suicidal ideations.  Describes chronic anxiety symptoms, including agoraphobia.  She reports close and strong family relationships.  Tolerating medications well and states  that current combination regimen has been helpful and more effective than previous ones.  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  Will see in 4 weeks  Agrees to contact clinic sooner if any concerns or worsening prior, also has number for crisis hotline if needed     Craige Cotta, MD 03/12/2022, 9:10 AM  Patient ID: Monica Ballard, female   DOB: January 05, 1976, 46 y.o.   MRN: [id number]

## 2022-04-19 ENCOUNTER — Other Ambulatory Visit: Payer: Self-pay

## 2022-04-19 ENCOUNTER — Emergency Department (HOSPITAL_COMMUNITY)
Admission: EM | Admit: 2022-04-19 | Discharge: 2022-04-19 | Disposition: A | Discharge status: Home / Self Care | Payer: No Typology Code available for payment source | Attending: Emergency Medicine | Admitting: Emergency Medicine

## 2022-04-19 ENCOUNTER — Encounter (HOSPITAL_COMMUNITY): Payer: Self-pay

## 2022-04-19 ENCOUNTER — Emergency Department (HOSPITAL_COMMUNITY): Payer: No Typology Code available for payment source

## 2022-04-19 DIAGNOSIS — R079 Chest pain, unspecified: Secondary | ICD-10-CM | POA: Insufficient documentation

## 2022-04-19 DIAGNOSIS — R600 Localized edema: Secondary | ICD-10-CM | POA: Insufficient documentation

## 2022-04-19 DIAGNOSIS — R0602 Shortness of breath: Secondary | ICD-10-CM | POA: Insufficient documentation

## 2022-04-19 DIAGNOSIS — M79602 Pain in left arm: Secondary | ICD-10-CM | POA: Diagnosis not present

## 2022-04-19 DIAGNOSIS — I10 Essential (primary) hypertension: Secondary | ICD-10-CM | POA: Insufficient documentation

## 2022-04-19 HISTORY — DX: Essential (primary) hypertension: I10

## 2022-04-19 LAB — CBC
HCT: 44.2 % (ref 36.0–46.0)
Hemoglobin: 14 g/dL (ref 12.0–15.0)
MCH: 28.7 pg (ref 26.0–34.0)
MCHC: 31.7 g/dL (ref 30.0–36.0)
MCV: 90.6 fL (ref 80.0–100.0)
Platelets: 284 10*3/uL (ref 150–400)
RBC: 4.88 MIL/uL (ref 3.87–5.11)
RDW: 14.8 % (ref 11.5–15.5)
WBC: 8 10*3/uL (ref 4.0–10.5)
nRBC: 0 % (ref 0.0–0.2)

## 2022-04-19 LAB — BASIC METABOLIC PANEL
Anion gap: 10 (ref 5–15)
BUN: 9 mg/dL (ref 6–20)
CO2: 26 mmol/L (ref 22–32)
Calcium: 9.5 mg/dL (ref 8.9–10.3)
Chloride: 104 mmol/L (ref 98–111)
Creatinine, Ser: 0.86 mg/dL (ref 0.44–1.00)
GFR, Estimated: >60 mL/min (ref >60)
Glucose, Bld: 88 mg/dL (ref 70–99)
Potassium: 3.9 mmol/L (ref 3.5–5.1)
Sodium: 140 mmol/L (ref 135–145)

## 2022-04-19 LAB — TROPONIN I (HIGH SENSITIVITY)
Troponin I (High Sensitivity): 2 ng/L (ref <18)
Troponin I (High Sensitivity): 3 ng/L (ref <18)

## 2022-04-19 LAB — BRAIN NATRIURETIC PEPTIDE: B Natriuretic Peptide: 260 pg/mL — ABNORMAL HIGH (ref 0.0–100.0)

## 2022-04-19 LAB — POC URINE PREG, ED: Preg Test, Ur: NEGATIVE

## 2022-04-19 MED ORDER — NITROGLYCERIN 0.4 MG SL SUBL
0.4000 mg | SUBLINGUAL_TABLET | Freq: Once | SUBLINGUAL | Status: AC
  Administered 2022-04-19: 0.4 mg via SUBLINGUAL
  Filled 2022-04-19: qty 1

## 2022-04-19 MED ORDER — ASPIRIN 81 MG PO CHEW
324.0000 mg | CHEWABLE_TABLET | Freq: Once | ORAL | Status: AC
  Administered 2022-04-19: 324 mg via ORAL
  Filled 2022-04-19: qty 4

## 2022-04-19 NOTE — Discharge Instructions (Addendum)
You were seen in the emergency department for chest pain. Your work up here has been reassuring. Please reduce your stress, decreased caffeine intake, and relax over the next few days. Please take your lasix to decrease your leg swelling. Please call your primary care tomorrow morning for follow-up on your stress testing. Please return to the emergency department for worsening chest pain, shortness of breath or chest pain with nausea, sweating or passing out.

## 2022-04-19 NOTE — ED Notes (Signed)
Called lab to inform BNP was ordered on pt an hour ago and blood was drawn by phlebotomist earlier, this lab was added on. Lab processing now

## 2022-04-19 NOTE — ED Triage Notes (Signed)
Pt presents to ED with complaints of left sided chest pain started this morning, left arm weakness, nausea, SOB, dizziness

## 2022-04-19 NOTE — ED Provider Notes (Signed)
[facility] EMERGENCY DEPARTMENT Provider Note   CSN: [id number] Arrival date & time: 04/19/22  1537     History  Chief Complaint  Patient presents with   Chest Pain    Monica Ballard is a 46 y.o. female.  With past medical history of GERD, hyperlipidemia, hypertension who presents to the emergency department with chest pain.  Patient states that symptoms began this morning.  She describes having central chest pain that is cramping in nature and radiates through to her back.  She states that as the morning went on the chest pain "let up some," but then later returned and is worse.  She describes it now as a more left-sided chest pain that goes into her left shoulder.  She has had associated shortness of breath, and left arm pain.  She states that the left arm feels heavy.  She states that the symptoms are worse with ambulating.  Currently is 6/10.  She states that more remotely she has had ongoing shortness of breath and lower extremity swelling.  She states that she has been taking Lasix as needed for her symptoms.  States that she has been trying to see a cardiologist and was supposed to have a stress test done but this has yet to be completed.  She also has not treated for her hypertension.   Chest Pain Associated symptoms: shortness of breath   Associated symptoms: no diaphoresis, no nausea and no palpitations        Home Medications Prior to Admission medications   Medication Sig Start Date End Date Taking? Authorizing Provider  budesonide-formoterol (SYMBICORT) 160-4.5 MCG/ACT inhaler Inhale 2 puffs into the lungs 2 (two) times daily.   Yes [provider]  buPROPion (WELLBUTRIN XL) 300 MG 24 hr tablet Take 1 tablet (300 mg total) by mouth every morning. 03/12/22 03/12/23 Yes Cobos, Myer Peer, MD  cyanocobalamin 1000 MCG tablet Take 1,000 mcg by mouth daily.   Yes [provider]  diazepam (VALIUM) 5 MG tablet Take 1 tablet (5 mg total) by mouth every 8 (eight)  hours as needed for anxiety. 03/12/22 03/12/23 Yes Cobos, Myer Peer, MD  furosemide (LASIX) 20 MG tablet Take 20 mg by mouth daily as needed. 04/03/22  Yes [provider]  lamoTRIgine (LAMICTAL) 200 MG tablet Take 1 tablet (200 mg total) by mouth at bedtime. 03/12/22  Yes Cobos, Myer Peer, MD  meloxicam (MOBIC) 15 MG tablet Take 15 mg by mouth daily. 02/14/22  Yes [provider]  Multiple Vitamin (MULTIVITAMIN WITH MINERALS) TABS Take 1 tablet by mouth daily.   Yes [provider]  pantoprazole (PROTONIX) 40 MG tablet Take 1 tablet (40 mg total) by mouth daily. 07/04/17  Yes Rancour, Annie Main, MD  sertraline (ZOLOFT) 100 MG tablet Take 2 tablets (200 mg total) by mouth daily. 03/12/22 05/11/22 Yes Cobos, Myer Peer, MD  polyethylene glycol (MIRALAX / GLYCOLAX) packet Take 17 g by mouth daily.    [provider]  promethazine (PHENERGAN) 25 MG tablet Take 1 tablet (25 mg total) by mouth 3 (three) times daily as needed for nausea or vomiting. Patient not taking: Reported on 04/19/2022 08/22/20   Pucilowski, Marchia Bond, MD  sucralfate (CARAFATE) 1 g tablet Take 1 tablet (1 g total) by mouth 4 (four) times daily -  with meals and at bedtime. Patient not taking: Reported on 04/19/2022 07/04/17   Ezequiel Essex, MD      Allergies    Hydrocodone-acetaminophen and Penicillins    Review  of Systems   Review of Systems  Constitutional:  Negative for diaphoresis.  Respiratory:  Positive for chest tightness and shortness of breath.   Cardiovascular:  Positive for chest pain and leg swelling. Negative for palpitations.  Gastrointestinal:  Negative for nausea.  Neurological:  Positive for light-headedness.  All other systems reviewed and are negative.   Physical Exam Updated Vital Signs BP 132/83   Pulse 71   Temp 98.1 F (36.7 C) (Oral)   Resp 19   Ht 5\' 9"  (1.753 m)   Wt 106.7 kg   LMP 08/25/2012   SpO2 96%   BMI 34.75 kg/m  Physical Exam Vitals and nursing  note reviewed.  Constitutional:      General: She is not in acute distress.    Appearance: Normal appearance. She is well-developed. She is not ill-appearing or toxic-appearing.  HENT:     Head: Normocephalic and atraumatic.  Eyes:     General: No scleral icterus.    Extraocular Movements: Extraocular movements intact.     Pupils: Pupils are equal, round, and reactive to light.  Neck:     Vascular: No JVD.  Cardiovascular:     Rate and Rhythm: Normal rate and regular rhythm.     Pulses:          Radial pulses are 2+ on the right side and 2+ on the left side.       Posterior tibial pulses are 1+ on the right side and 1+ on the left side.     Heart sounds: Normal heart sounds. No murmur heard.    Comments: Trace bilateral pitting edema Pulmonary:     Effort: Pulmonary effort is normal. No tachypnea or respiratory distress.     Breath sounds: Normal breath sounds. No rales.  Abdominal:     General: Bowel sounds are normal.     Palpations: Abdomen is soft.  Musculoskeletal:     Cervical back: Neck supple.     Right lower leg: Edema present.     Left lower leg: Edema present.  Skin:    General: Skin is warm and dry.     Capillary Refill: Capillary refill takes less than 2 seconds.  Neurological:     General: No focal deficit present.     Mental Status: She is alert and oriented to person, place, and time.  Psychiatric:        Mood and Affect: Mood normal.        Behavior: Behavior normal.    ED Results / Procedures / Treatments   Labs (all labs ordered are listed, but only abnormal results are displayed) Labs Reviewed  BRAIN NATRIURETIC PEPTIDE - Abnormal; Notable for the following components:      Result Value   B Natriuretic Peptide 260.0 (*)    All other components within normal limits  BASIC METABOLIC PANEL  CBC  POC URINE PREG, ED  TROPONIN I (HIGH SENSITIVITY)  TROPONIN I (HIGH SENSITIVITY)   EKG EKG Interpretation  Date/Time:  Thursday April 19 2022  15:52:53 EDT Ventricular Rate:  73 PR Interval:  142 QRS Duration: 78 QT Interval:  424 QTC Calculation: 467 R Axis:   45 Text Interpretation: Normal sinus rhythm Low voltage QRS Borderline ECG When compared with ECG of 04-Jul-2017 04:47, PREVIOUS ECG IS PRESENT No acute changes No significant change since last tracing Confirmed by Varney Biles [phone]) on 04/19/2022 10:08:22 PM  Radiology No results found.  Procedures Procedures    Medications Ordered in ED Medications  aspirin chewable tablet 324 mg (324 mg Oral Given 04/19/22 2305)  nitroGLYCERIN (NITROSTAT) SL tablet 0.4 mg (0.4 mg Sublingual Given 04/19/22 2307)    ED Course/ Medical Decision Making/ A&P                            Medical Decision Making Amount and/or Complexity of Data Reviewed Labs: ordered. Radiology: ordered.  Risk OTC drugs. Prescription drug management.   This patient presents to the ED with chief complaint(s) of chest pain with pertinent past medical history of high pretension, hyperlipidemia which further complicates the presenting complaint. The complaint involves an extensive differential diagnosis and also carries with it a high risk of complications and morbidity.    The differential diagnosis includes Acute chest syndrome, stable angina, atypical angina, pulmonary embolism, pneumothorax, aortic dissection, pleural effusion, CHF, COPD, asthma, myocarditis, pericarditis, cardiac tamponade, chest wall pain    Additional history obtained: Additional history obtained from family Records reviewed Care Everywhere/External Records and Primary Care Documents  ED Course and Reassessment: 46 year old female who presents to the emergency department with chest pain.  Physical exam is notable for no adventitious cardiac sounds or lung sounds.  She is hemodynamically stable, nonseptic, nontoxic in appearance.  There is no JVD.  She does have some mild lower extremity swelling.  EKG is without  ischemia or infarction, troponin x2 negative, doubt ACS at this time. Chest x-ray without pulmonary edema. She does have some mild lower extremity swelling, BNP 260, which is not significantly elevated but noted.   CBC, BMP are unremarkable. Given aspirin a dose and nitroglycerin which did not seem to improve symptoms.  Consulted and spoke with Dr. Hassell Done, cardiology who does not feel that the patient needs further work-up at this time.  She feels that the patient should have follow-up with PCP for stress testing.  Symptoms are inconsistent with other etiologies of chest pain such as pneumothorax, pneumonia, pleural effusion as her chest x-ray is clear.  Symptoms are inconsistent with aortic dissection, myocarditis, pericarditis, tamponade.  I do think her chest symptoms are concerning for possible cardiac etiology and feel that she needs close follow-up for stress testing.  She verbalizes understanding of this.  Discussed that she should have follow-up soon with her PCP to schedule her stress testing and states that she will do this.  Discussed that she did have elevated BNP which is not significantly elevated and she is already on Lasix.  Instructed to continue this and she verbalized understanding.  Otherwise, she is safe for discharge at this time. Independent labs interpretation:  The following labs were independently interpreted: Troponin x2 negative  Independent visualization of imaging: - I independently visualized the following imaging with scope of interpretation limited to determining acute life threatening conditions related to emergency care: Chest x-ray, which revealed no acute findings  Consultation: - Consulted or discussed management/test interpretation w/ external professional: Dr. Hassell Done, cardiology who does not feel patient needs further work up or intervention/admission at this time. F/u with PCP for stress testing.   Consideration for admission or further workup: Not  indicated Social Determinants of health: None identified Final Clinical Impression(s) / ED Diagnoses Final diagnoses:  Chest pain, unspecified type    Rx / DC Orders ED Discharge Orders     None         Mickie Hillier, PA-C 04/25/22 1445    Varney Biles, MD 04/25/22 1555

## 2022-04-25 ENCOUNTER — Ambulatory Visit (HOSPITAL_COMMUNITY): Payer: Self-pay | Admitting: Psychiatry

## 2022-04-26 ENCOUNTER — Encounter (HOSPITAL_COMMUNITY): Payer: Self-pay | Admitting: Psychiatry

## 2022-04-26 ENCOUNTER — Telehealth (HOSPITAL_BASED_OUTPATIENT_CLINIC_OR_DEPARTMENT_OTHER): Payer: No Typology Code available for payment source | Admitting: Psychiatry

## 2022-04-26 DIAGNOSIS — F411 Generalized anxiety disorder: Secondary | ICD-10-CM

## 2022-04-26 MED ORDER — DIAZEPAM 5 MG PO TABS: 5.0000 mg | ORAL_TABLET | Freq: Three times a day (TID) | ORAL | 1 refills | Status: DC | PRN

## 2022-04-26 MED ORDER — LAMOTRIGINE 200 MG PO TABS: 200.0000 mg | ORAL_TABLET | Freq: Every day | ORAL | 1 refills | Status: DC

## 2022-04-26 MED ORDER — SERTRALINE HCL 100 MG PO TABS: 200.0000 mg | ORAL_TABLET | Freq: Every day | ORAL | 1 refills | Status: DC

## 2022-04-26 MED ORDER — BUPROPION HCL ER (XL) 150 MG PO TB24: 150.0000 mg | ORAL_TABLET | ORAL | 1 refills | Status: DC

## 2022-04-26 NOTE — Progress Notes (Signed)
BH MD/PA/NP OP Progress Note  04/26/2022 11:17 AM Monica Ballard  MRN:  [id number] This was a phone based medication management appointment.  Patient's identity verified using 2 different identifiers.  Limitations associated with this type of communication have been reviewed.  Location of parties Patient-[facility]   Duration 25 minutes  Chief Complaint:   Returns for medication management appointment  HPI: 46 yo married white female initially referred by her PCP for treatment of chronic depression and anxiety resistant to treatment. Reported history of chronic anxiety, depression over a period of years, with several antidepressant trials in the past that she did not feel worked.  She has no hx of mania, psychosis, alcohol or drug abuse.    Monica. Ballard reports she has been doing " better" recently , and describes some improvement in mood and less agoraphobia. She reports that (although she continues to prefer not leaving her home and still tends to feel uncomfortable outside of her home ) she has been better able to go to stores and other public venues in the company of family members, primarily her adult daughter , with less anxiety and no associated panic symptoms. She also describes partially improved mood .  Does not endorse significant neuro-vegetative symptoms at this time.  No SI, no anhedonia .  Denies medication side effects.  ( On Wellbuytrin XL, Zoloft, Valium, Lamictal ) .  No report or indication of BZD misuse at this time.   Of note, she recently went to ED due to chest pain. She also reports having some peripheral edema.  Troponin was negative . BNP was mildly elevated . She was not admitted and returned home. She reports she has had no further instances of chest pain and does not endorse dyspnea . She has an upcoming appointment for an echocardiogram ( next week) .   She reports her BP has been intermittently elevated  , and  reports it was 140/95 at home recently  . ( Was  132/83 in ED ) .        Visit Diagnosis:  No diagnosis found.  Past Psychiatric History: Please see intake H&P.  Past Medical History:  Past Medical History:  Diagnosis Date   Anemia    Anxiety    Asthma    smoker-uses nebulizer-not used in last year, albuterol rescue inhaler used in 11/13 preoperatively   Chronic bronchitis (HCC)    nebulizer and rescue inhaler   Depression    GERD (gastroesophageal reflux disease)    occasional -tums   Headache(784.0)    Hearing loss in left ear    Hypercholesteremia    Hyperlipidemia    Hypertension    Missed ab 01/09/2012   10weeks-used cytotec   Numbness and tingling    PONV (postoperative nausea and vomiting)    Preterm labor    Teeth decayed    lower , nothing loose-encouraged pt to seek dental care if necessary    Past Surgical History:  Procedure Laterality Date   ABDOMINAL HYSTERECTOMY     CHOLECYSTECTOMY     CHOLECYSTECTOMY, LAPAROSCOPIC  12/06/2010   DILATION AND CURETTAGE OF UTERUS  06/11/2012   Procedure: DILATATION AND CURETTAGE;  Surgeon: Geryl Rankins, MD;  Location: [facility];  Service: Gynecology;  Laterality: N/A;  sec procedure start  1241   DILATION AND EVACUATION  01/16/2012   Procedure: DILATATION AND EVACUATION;  Surgeon: Geryl Rankins, MD;  Location: [facility];  Service: Gynecology;  Laterality: N/A;   LAPAROSCOPY  06/11/2012  Procedure: LAPAROSCOPY OPERATIVE;  Surgeon: Thurnell Lose, MD;  Location: [facility];  Service: Gynecology;  Laterality: N/A;   left ear drum rebuilt     x2   TYMPANOPLASTY Left 04/23/2016   Procedure: TYMPANOPLASTY LEFT EAR;  Surgeon: Izora Gala, MD;  Location: [facility];  Service: ENT;  Laterality: Left;   VAGINAL HYSTERECTOMY N/A 09/01/2012   Procedure: HYSTERECTOMY VAGINAL;  Surgeon: Thurnell Lose, MD;  Location: [facility];  Service: Gynecology;  Laterality: N/A;    Family Psychiatric History: Reviewed.  Family History:  Family History  Problem Relation  Age of Onset   Cancer Other        family hx   Diabetes Other        family hx   Depression Other        family hx    Coronary artery disease Mother        also MVD   Hyperlipidemia Mother    Hypertension Mother    Hypertension Father    Diabetes Father    Diabetes Maternal Grandmother    Colon cancer Paternal Grandmother    Breast cancer Maternal Aunt    Anesthesia problems Neg Hx     Social History:  Social History   Socioeconomic History   Marital status: Married    Spouse name: Not on file   Number of children: 2   Years of education: GED   Highest education level: Not on file  Occupational History   Occupation: Housewife  Tobacco Use   Smoking status: Former    Packs/day: 0.25    Years: 8.00    Total pack years: 2.00    Types: Cigarettes   Smokeless tobacco: Never   Tobacco comments:    up to 1/2 ppl x 8 years  Substance and Sexual Activity   Alcohol use: No   Drug use: No   Sexual activity: Yes    Birth control/protection: Surgical  Other Topics Concern   Not on file  Social History Narrative   Lives with daughter, son and husband in a one story home.  Has 2 children.  Does not work at this time.  Education: GED.    Right-handed.   4-5 twelve ounce cans per day.   Social Determinants of Health   Financial Resource Strain: Not on file  Food Insecurity: Not on file  Transportation Needs: Not on file  Physical Activity: Not on file  Stress: Not on file  Social Connections: Not on file    Allergies:  Allergies  Allergen Reactions   Hydrocodone-Acetaminophen Hives and Swelling    Hives   Penicillins Hives    Metabolic Disorder Labs: No results found for: "HGBA1C", "MPG" No results found for: "PROLACTIN" No results found for: "CHOL", "TRIG", "HDL", "CHOLHDL", "VLDL", "LDLCALC" Lab Results  Component Value Date   TSH 3.270 12/22/2021    Therapeutic Level Labs: No results found for: "LITHIUM" No results found for: "VALPROATE" No results  found for: "CBMZ"  Current Medications: Current Outpatient Medications  Medication Sig Dispense Refill   budesonide-formoterol (SYMBICORT) 160-4.5 MCG/ACT inhaler Inhale 2 puffs into the lungs 2 (two) times daily.     buPROPion (WELLBUTRIN XL) 300 MG 24 hr tablet Take 1 tablet (300 mg total) by mouth every morning. 30 tablet 1   cyanocobalamin 1000 MCG tablet Take 1,000 mcg by mouth daily.     diazepam (VALIUM) 5 MG tablet Take 1 tablet (5 mg total) by mouth every 8 (eight) hours as needed for anxiety.  90 tablet 1   furosemide (LASIX) 20 MG tablet Take 20 mg by mouth daily as needed.     lamoTRIgine (LAMICTAL) 200 MG tablet Take 1 tablet (200 mg total) by mouth at bedtime. 30 tablet 1   meloxicam (MOBIC) 15 MG tablet Take 15 mg by mouth daily.     Multiple Vitamin (MULTIVITAMIN WITH MINERALS) TABS Take 1 tablet by mouth daily.     pantoprazole (PROTONIX) 40 MG tablet Take 1 tablet (40 mg total) by mouth daily. 30 tablet 0   polyethylene glycol (MIRALAX / GLYCOLAX) packet Take 17 g by mouth daily.     promethazine (PHENERGAN) 25 MG tablet Take 1 tablet (25 mg total) by mouth 3 (three) times daily as needed for nausea or vomiting. (Patient not taking: Reported on 04/19/2022) 90 tablet 1   sertraline (ZOLOFT) 100 MG tablet Take 2 tablets (200 mg total) by mouth daily. 60 tablet 1   sucralfate (CARAFATE) 1 g tablet Take 1 tablet (1 g total) by mouth 4 (four) times daily -  with meals and at bedtime. (Patient not taking: Reported on 04/19/2022) 30 tablet 0   No current facility-administered medications for this visit.     Psychiatric Specialty Exam: Please note limitations in obtaining a full mental status exam in the context of phone communication ROS - reports recent episode of chest pain for which she went to ED . Reports chest pain has resolved, does not endorse dyspnea .    Last menstrual period 08/25/2012.There is no height or weight on file to calculate BMI.  General Appearance: NA  Eye  Contact:  NA    Speech:  Clear and Coherent  Volume:  Normal  Mood:  reports mood as  improved, feeling " better"  Affect: Presents reactive  Thought Process:  Goal Directed  Orientation:  Full (Time, Place, and Person)  Thought Content: No hallucinations endorsed.  No delusions expressed.  No suicidal ideations, future oriented   Suicidal Thoughts:  No  no suicidal or self injurious ideations.   Homicidal Thoughts:  No no violent or homicidal ideations.  Memory:  Grossly intact   Judgement:  Good  Insight:  Fair/improving  Psychomotor Activity:  NA  Concentration: grossly intact   Recall:  Good  Fund of Knowledge: Good  Language: Good  Akathisia:  Negative  Handed:  Right  AIMS (if indicated): not done  Assets:  Communication Skills Desire for Improvement Financial Resources/Insurance Housing Social Support Talents/Skills  ADL's:  Intact  Cognition: WNL  Sleep:  Fair    Assessment and Plan: 46 yo female with a history of chronic depression and anxiety.  She has been diagnosed with MDD and GAD.  Reviewed history, does not endorse any clear history of mania or hypomania.  Endorses long history of anxiety symptoms dating back to adolescence, to include symptoms of social anxiety and agoraphobia.  Monica Ballard reports improvement . Describes mood as generally improved  and has been feeling " better', currently denies anhedonia or other significant neuro-vegetative symptoms . She has described a long history of social  anxiety symptoms, leading to preferring to stay home. She reports she has been better able to leave home and go to stores , etc in the company of family members , with less anxiety .   Recent episode of chest pain, now resolved, for which she went to ED. Of note, has a scheduled echocardiogram next week.  Reports BP has been intermittently elevated, but was unremarkable in ED ( 132/83 ) .  We discussed medication options and side effects, including potential  cardiovascular side effects of Wellbutrin , which may contribute to palpitations , elevated BP. She has taken Wellbutrin for a long time and denies side effects. She appears to be responding well to Zoloft, which was titrated up to 200 mgr QDAY earlier this year.   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  Will see in 4 weeks  Agrees to contact clinic sooner if any concerns or worsening prior, also has number for crisis hotline if needed     Craige Cotta, MD 04/26/2022, 11:17 AM  Patient ID: Monica Ballard, female   DOB: 07/04/1976, 46 y.o.   MRN: [id number]

## 2022-04-30 NOTE — Progress Notes (Signed)
Cardiology Office Note:    Date:  04/30/2022   ID:  Monica Ballard, DOB [birth year], MRN [id number]  PCP:  [facility]  Cardiologist:  None   Referring MD: Rosalee Kaufman, *   No chief complaint on file.   History of Present Illness:    Monica Ballard is a 46 y.o. female with a hx of anxiety, hypercholesterolemia, hypertension, who is being seen for chest pain.  In ER the following history was obtained: "She describes having central chest pain that is cramping in nature and radiates through to her back.  She states that as the morning went on the chest pain "let up some," but then later returned and is worse.  She describes it now as a more left-sided chest pain that goes into her left shoulder. "  4-month history of dyspnea on exertion, pressure in the chest that occurs randomly, and fluid retention.  When seen in the emergency room, Lasix was started at 20 mg to be used as needed.  She has been taking it every day and states that it helps her to feel better.  She has never had a myocardial infarction.  She was a smoker for 8 years but discontinued smoking approximately 10 years ago.  There is a family history of cardiovascular disease in her mother, and a sister died at age 51 of CHF.  She states that her primary doctor has been concerned about labile blood pressure recordings.  She is not diabetic, lipids are not significantly elevated, and she states that she sleeps well.  Past Medical History:  Diagnosis Date   Anemia    Anxiety    Asthma    smoker-uses nebulizer-not used in last year, albuterol rescue inhaler used in 11/13 preoperatively   Chronic bronchitis (HCC)    nebulizer and rescue inhaler   Depression    GERD (gastroesophageal reflux disease)    occasional -tums   Headache(784.0)    Hearing loss in left ear    Hypercholesteremia    Hyperlipidemia    Hypertension    Missed ab 01/09/2012   10weeks-used cytotec   Numbness and tingling    PONV  (postoperative nausea and vomiting)    Preterm labor    Teeth decayed    lower , nothing loose-encouraged pt to seek dental care if necessary    Past Surgical History:  Procedure Laterality Date   ABDOMINAL HYSTERECTOMY     CHOLECYSTECTOMY     CHOLECYSTECTOMY, LAPAROSCOPIC  12/06/2010   DILATION AND CURETTAGE OF UTERUS  06/11/2012   Procedure: DILATATION AND CURETTAGE;  Surgeon: Thurnell Lose, MD;  Location: [facility];  Service: Gynecology;  Laterality: N/A;  sec procedure start  [place]  01/16/2012   Procedure: DILATATION AND EVACUATION;  Surgeon: Thurnell Lose, MD;  Location: [facility];  Service: Gynecology;  Laterality: N/A;   LAPAROSCOPY  06/11/2012   Procedure: LAPAROSCOPY OPERATIVE;  Surgeon: Thurnell Lose, MD;  Location: [facility];  Service: Gynecology;  Laterality: N/A;   left ear drum rebuilt     x2   TYMPANOPLASTY Left 04/23/2016   Procedure: TYMPANOPLASTY LEFT EAR;  Surgeon: Izora Gala, MD;  Location: [facility];  Service: ENT;  Laterality: Left;   VAGINAL HYSTERECTOMY N/A 09/01/2012   Procedure: HYSTERECTOMY VAGINAL;  Surgeon: Thurnell Lose, MD;  Location: [facility];  Service: Gynecology;  Laterality: N/A;    Current Medications: No outpatient medications have been marked as taking for the 05/03/22 encounter (Appointment)  with Lyn Records, MD.     Allergies:   Hydrocodone-acetaminophen and Penicillins   Social History   Socioeconomic History   Marital status: Married    Spouse name: Not on file   Number of children: 2   Years of education: GED   Highest education level: Not on file  Occupational History   Occupation: Housewife  Tobacco Use   Smoking status: Former    Packs/day: 0.25    Years: 8.00    Total pack years: 2.00    Types: Cigarettes   Smokeless tobacco: Never   Tobacco comments:    up to 1/2 ppl x 8 years  Substance and Sexual Activity   Alcohol use: No   Drug use: No   Sexual activity: Yes    Birth  control/protection: Surgical  Other Topics Concern   Not on file  Social History Narrative   Lives with daughter, son and husband in a one story home.  Has 2 children.  Does not work at this time.  Education: GED.    Right-handed.   4-5 twelve ounce cans per day.   Social Determinants of Health   Financial Resource Strain: Not on file  Food Insecurity: Not on file  Transportation Needs: Not on file  Physical Activity: Not on file  Stress: Not on file  Social Connections: Not on file     Family History: The patient's family history includes Breast cancer in her maternal aunt; Cancer in an other family member; Colon cancer in her paternal grandmother; Coronary artery disease in her mother; Depression in an other family member; Diabetes in her father, maternal grandmother, and another family member; Hyperlipidemia in her mother; Hypertension in her father and mother. There is no history of Anesthesia problems.  ROS:   Please see the history of present illness.    No wheezing or history of asthma.  Does have anxiety issues.  All other systems reviewed and are negative.  EKGs/Labs/Other Studies Reviewed:    The following studies were reviewed today: No new imaging  EKG:  EKG  NSR with NSSTA  Recent Labs: 12/22/2021: TSH 3.270 04/19/2022: B Natriuretic Peptide 260.0; BUN 9; Creatinine, Ser 0.86; Hemoglobin 14.0; Platelets 284; Potassium 3.9; Sodium 140  Recent Lipid Panel No results found for: "CHOL", "TRIG", "HDL", "CHOLHDL", "VLDL", "LDLCALC", "LDLDIRECT"  Physical Exam:    VS:  LMP 08/25/2012     Wt Readings from Last 3 Encounters:  04/19/22 235 lb 5 oz (106.7 kg)  07/04/17 195 lb (88.5 kg)  11/12/16 195 lb (88.5 kg)     GEN: Obese. No acute distress HEENT: Normal NECK: No JVD. LYMPHATICS: No lymphadenopathy CARDIAC: No murmur. RRR no gallop, or edema. VASCULAR:  Normal Pulses. No bruits. RESPIRATORY:  Clear to auscultation without rales, wheezing or rhonchi  ABDOMEN:  Soft, non-tender, non-distended, No pulsatile mass, MUSCULOSKELETAL: No deformity  SKIN: Warm and dry NEUROLOGIC:  Alert and oriented x 3 PSYCHIATRIC:  Normal affect   ASSESSMENT:    1. Chest pain, unspecified type   2. Generalized anxiety disorder   3. Hypercholesterolemia   4. Primary hypertension    PLAN:    In order of problems listed above:  This complaint associated with dyspnea is of uncertain etiology.  Diastolic heart failure and coronary artery disease a possible although her age mitigates against that.  She could have microvascular disease.  She is already set to have a stress echo performed at [facility] which was scheduled by her primary physician, Dr.  Skillman.  We will await the results.  There is no obvious evidence of coronary disease or heart failure based upon today's exam. Did not seem anxious or inappropriate today. Lipids are not terribly elevated with the most recent LDL of 102 in March. Blood pressure is done on exam today, twice by me were 120/90 mmHg (sitting) and 128/78 mmHg (lying)   Await results of stress echo.  She will notify us when the study is completed and we will try to get the information and then speak with her concerning our thoughts about the findings.  This does cause this evaluation to be somewhat awkward and does not allow primary evaluation of the data set, but we will see how it goes.   Medication Adjustments/Labs and Tests Ordered: Current medicines are reviewed at length with the patient today.  Concerns regarding medicines are outlined above.  No orders of the defined types were placed in this encounter.  No orders of the defined types were placed in this encounter.   There are no Patient Instructions on file for this visit.   Signed, Lesleigh Noe, MD  04/30/2022 7:43 PM    [facility]

## 2022-05-03 ENCOUNTER — Encounter: Payer: Self-pay | Admitting: Interventional Cardiology

## 2022-05-03 ENCOUNTER — Ambulatory Visit
Payer: No Typology Code available for payment source | Attending: Interventional Cardiology | Admitting: Interventional Cardiology

## 2022-05-03 VITALS — BP 124/88 | HR 90 | Ht 68.0 in | Wt 232.0 lb

## 2022-05-03 DIAGNOSIS — E7800 Pure hypercholesterolemia, unspecified: Secondary | ICD-10-CM

## 2022-05-03 DIAGNOSIS — I10 Essential (primary) hypertension: Secondary | ICD-10-CM

## 2022-05-03 DIAGNOSIS — R079 Chest pain, unspecified: Secondary | ICD-10-CM | POA: Diagnosis not present

## 2022-05-03 DIAGNOSIS — F411 Generalized anxiety disorder: Secondary | ICD-10-CM | POA: Diagnosis not present

## 2022-05-03 NOTE — Patient Instructions (Signed)
Medication Instructions:  Your physician recommends that you continue on your current medications as directed. Please refer to the Current Medication list given to you today.  *If you need a refill on your cardiac medications before your next appointment, please call your pharmacy*  Lab Work: NONE  Testing/Procedures: Nuclear stress test at [facility], please call our office at [phone] after you have had this performed so that Dr. Tamala Julian may follow-up on results.  Follow-Up: Will be determined based on results of stress test.  Important Information About Sugar

## 2022-05-09 ENCOUNTER — Telehealth: Payer: Self-pay | Admitting: Interventional Cardiology

## 2022-05-09 NOTE — Telephone Encounter (Signed)
Spoke with the patient and gave advisement from Dr. Tamala Julian.

## 2022-05-09 NOTE — Telephone Encounter (Signed)
Spoke with the patient who states that she has been having on and off chest pain. She states she had a stress test and echo done at [facility] yesterday. She was having these symptoms while she was there. They did not see any indication to send her to the ER at that time. Patient states that it feels like cramping in her chest and pain in her back. She also has some shortness of breath. She reports swelling in her legs and has been taking her prn Lasix daily ever since she went to the ER a couple of weeks ago. Patient states symptoms are not as severe as when she went to the ER. Results from her testing yesterday are in [place] and pasted below:  FINDINGS:  Perfusion: No decreased activity in the left ventricle on stress  imaging to suggest reversible ischemia or infarction.   Wall Motion: Normal left ventricular wall motion. No left  ventricular dilation.   Left Ventricular Ejection Fraction: 66 %   End diastolic volume 86 ml   End systolic volume 29 ml   IMPRESSION:  1. No reversible ischemia or infarction.   2. Normal left ventricular wall motion.   3. Left ventricular ejection fraction 66%   4. Non invasive risk stratification*: Low    Will send to Dr. Tamala Julian for review. ER precautions also given to patient.

## 2022-05-09 NOTE — Telephone Encounter (Signed)
Pt c/o of Chest Pain: STAT if CP now or developed within 24 hours  1. Are you having CP right now? Yes  2. Are you experiencing any other symptoms (ex. SOB, nausea, vomiting, sweating)? SOB  3. How long have you been experiencing CP?  yesterday  4. Is your CP continuous or coming and going? Come and go  5. Have you taken Nitroglycerin? No  Pt is having pains in the middle of her back as well. Pt states that during Echo and Stress Test yesterday she was having these same issues. Please advise ?

## 2022-05-21 ENCOUNTER — Encounter (HOSPITAL_COMMUNITY): Payer: Self-pay | Admitting: Psychiatry

## 2022-05-21 ENCOUNTER — Telehealth (HOSPITAL_BASED_OUTPATIENT_CLINIC_OR_DEPARTMENT_OTHER): Payer: No Typology Code available for payment source | Admitting: Psychiatry

## 2022-05-21 DIAGNOSIS — F324 Major depressive disorder, single episode, in partial remission: Secondary | ICD-10-CM

## 2022-05-21 DIAGNOSIS — F411 Generalized anxiety disorder: Secondary | ICD-10-CM | POA: Diagnosis not present

## 2022-05-21 NOTE — Progress Notes (Signed)
BH MD/PA/NP OP Progress Note  05/21/2022 2:17 PM Monica Ballard  MRN:  [id number] This was a phone based medication management appointment.  Patient's identity verified using 2 different identifiers.  Limitations associated with this type of communication have been reviewed.  Location of [facility]   Duration 25 minutes  Chief Complaint:   Returns for medication management appointment  HPI: 46 yo married white female initially referred by her PCP for treatment of chronic depression and anxiety resistant to treatment. Reported history of chronic anxiety, depression over a period of years, with several antidepressant trials in the past that she did not feel worked.  She has no hx of mania, psychosis, alcohol or drug abuse.    Monica Ballard reports she has been doing better with regards to anxiety/ agoraphobia symptoms. She describes she has been better able to go out of her home, particularly in the company of her daughter . She feels that overall she has been less avoidant of leaving her home and interacting with others .  She reports her home life has been stable and describes supportive family system.  She recently saw cardiologist due to recent chest pain episodes and had an echocardiogram done 1-2 weeks ago.  Reports that she was given results verbally and was told was within normal . Per chart review , there is a notation from her PCP's office indicating results of Echo . Per this notation : wall motion normal, EF 66 % . -9/28 EKG - NSR QTc 467)  -10/12 BP readings 120/90 sitting , 128/78 lying down (as per PCP progress note in chart)    Monica Ballard indicates having anxiety about her cardiovascular health as she points out that her mother died at age 28 presumably from a cardiac event . She reports feeling somewhat reassured regarding ECHO results but would like to continue seeing cardiologist periodically for monitoring . She also has an established PCP.   With  regards to psychiatric medication regimen, denies side effects, and feels regimen has been well tolerated . She is now on Wellubtrin XL 150 mgr QDAY ( down from 300 mgr QDAY ) . We have reviewed options , including tapering off Wellbutrin altogether to decrease possible anxiogenic side effects .  Her preference at this time is to continue current regimen , and states Wellbutrin has been helpful for mood symptoms.   Provided support /encouragement .        Visit Diagnosis:  No diagnosis found.  Past Psychiatric History: Please see intake H&P.  Past Medical History:  Past Medical History:  Diagnosis Date   Anemia    Anxiety    Asthma    smoker-uses nebulizer-not used in last year, albuterol rescue inhaler used in 11/13 preoperatively   Bipolar 1 disorder (HCC)    BMI 35.0-35.9,adult    Cervical disc disease    Chest pain    Chronic bronchitis (HCC)    nebulizer and rescue inhaler   Depression    Edema    GERD (gastroesophageal reflux disease)    occasional -tums   Headache(784.0)    Hearing loss in left ear    Hypercholesteremia    Hyperlipidemia    Hypertension    Menopausal symptoms    Missed ab 01/09/2012   10weeks-used cytotec   Numbness and tingling    PONV (postoperative nausea and vomiting)    Preterm labor    Right upper quadrant abdominal pain    SOB (shortness of breath)  Teeth decayed    lower , nothing loose-encouraged pt to seek dental care if necessary    Past Surgical History:  Procedure Laterality Date   ABDOMINAL HYSTERECTOMY     CHOLECYSTECTOMY     CHOLECYSTECTOMY, LAPAROSCOPIC  12/06/2010   DILATION AND CURETTAGE OF UTERUS  06/11/2012   Procedure: DILATATION AND CURETTAGE;  Surgeon: Geryl Rankins, MD;  Location: [facility];  Service: Gynecology;  Laterality: N/A;  sec procedure start  1241   DILATION AND EVACUATION  01/16/2012   Procedure: DILATATION AND EVACUATION;  Surgeon: Geryl Rankins, MD;  Location: [facility];  Service: Gynecology;   Laterality: N/A;   LAPAROSCOPY  06/11/2012   Procedure: LAPAROSCOPY OPERATIVE;  Surgeon: Geryl Rankins, MD;  Location: [facility];  Service: Gynecology;  Laterality: N/A;   left ear drum rebuilt     x2   TYMPANOPLASTY Left 04/23/2016   Procedure: TYMPANOPLASTY LEFT EAR;  Surgeon: Serena Colonel, MD;  Location: [facility];  Service: ENT;  Laterality: Left;   VAGINAL HYSTERECTOMY N/A 09/01/2012   Procedure: HYSTERECTOMY VAGINAL;  Surgeon: Geryl Rankins, MD;  Location: [facility];  Service: Gynecology;  Laterality: N/A;    Family Psychiatric History: Reviewed.  Family History:  Family History  Problem Relation Age of Onset   Cancer Other        family hx   Diabetes Other        family hx   Depression Other        family hx    Coronary artery disease Mother        also MVD   Hyperlipidemia Mother    Hypertension Mother    Hypertension Father    Diabetes Father    Diabetes Maternal Grandmother    Colon cancer Paternal Grandmother    Breast cancer Maternal Aunt    Anesthesia problems Neg Hx     Social History:  Social History   Socioeconomic History   Marital status: Married    Spouse name: Not on file   Number of children: 2   Years of education: GED   Highest education level: Not on file  Occupational History   Occupation: Housewife  Tobacco Use   Smoking status: Former    Packs/day: 0.25    Years: 8.00    Total pack years: 2.00    Types: Cigarettes   Smokeless tobacco: Never   Tobacco comments:    up to 1/2 ppl x 8 years  Substance and Sexual Activity   Alcohol use: No   Drug use: No   Sexual activity: Yes    Birth control/protection: Surgical  Other Topics Concern   Not on file  Social History Narrative   Lives with daughter, son and husband in a one story home.  Has 2 children.  Does not work at this time.  Education: GED.    Right-handed.   4-5 twelve ounce cans per day.   Social Determinants of Health   Financial Resource Strain: Not on file   Food Insecurity: Not on file  Transportation Needs: Not on file  Physical Activity: Not on file  Stress: Not on file  Social Connections: Not on file    Allergies:  Allergies  Allergen Reactions   Penicillins Hives    Metabolic Disorder Labs: No results found for: "HGBA1C", "MPG" No results found for: "PROLACTIN" No results found for: "CHOL", "TRIG", "HDL", "CHOLHDL", "VLDL", "LDLCALC" Lab Results  Component Value Date   TSH 3.270 12/22/2021    Therapeutic Level Labs: No  results found for: "LITHIUM" No results found for: "VALPROATE" No results found for: "CBMZ"  Current Medications: Current Outpatient Medications  Medication Sig Dispense Refill   budesonide-formoterol (SYMBICORT) 160-4.5 MCG/ACT inhaler Inhale 2 puffs into the lungs 2 (two) times daily.     buPROPion (WELLBUTRIN XL) 150 MG 24 hr tablet Take 1 tablet (150 mg total) by mouth every morning. 30 tablet 1   cyanocobalamin 1000 MCG tablet Take 1,000 mcg by mouth daily.     diazepam (VALIUM) 5 MG tablet Take 1 tablet (5 mg total) by mouth every 8 (eight) hours as needed for anxiety. 90 tablet 1   furosemide (LASIX) 20 MG tablet Take 20 mg by mouth daily as needed.     lamoTRIgine (LAMICTAL) 200 MG tablet Take 1 tablet (200 mg total) by mouth at bedtime. 30 tablet 1   Multiple Vitamin (MULTIVITAMIN WITH MINERALS) TABS Take 1 tablet by mouth daily.     pantoprazole (PROTONIX) 40 MG tablet Take 1 tablet (40 mg total) by mouth daily. 30 tablet 0   sertraline (ZOLOFT) 100 MG tablet Take 2 tablets (200 mg total) by mouth daily. 60 tablet 1   No current facility-administered medications for this visit.     Psychiatric Specialty Exam: Please note limitations in obtaining a full mental status exam in the context of phone communication ROS - none reported    Last menstrual period 08/25/2012.There is no height or weight on file to calculate BMI.  General Appearance: NA  Eye Contact:  NA    Speech:  Clear and Coherent   Volume:  Normal  Mood:  describes mood as partially improved   Affect: Presents reactive, vaguely anxious   Thought Process:  Goal Directed  Orientation:  Full (Time, Place, and Person)  Thought Content: No hallucinations .  No delusions expressed.  No suicidal ideations, future oriented    Suicidal Thoughts:  No  no suicidal or self injurious ideations, presents future oriented   Homicidal Thoughts:  No no violent or homicidal ideations.  Memory:  Grossly intact   Judgement:  Good  Insight:  Fair/improving  Psychomotor Activity:  NA  Concentration: grossly intact   Recall:  Good  Fund of Knowledge: Good  Language: Good  Akathisia:  Negative  Handed:  Right  AIMS (if indicated): not done  Assets:  Communication Skills Desire for Improvement Financial Resources/Insurance Housing Social Support Talents/Skills  ADL's:  Intact  Cognition: WNL  Sleep:  Fair    Assessment and Plan: 46 yo female with a history of chronic depression and anxiety.  She has been diagnosed with MDD and GAD.    Endorses long history of anxiety symptoms dating back to adolescence, to include symptoms of social anxiety and agoraphobia.  Monica Ballard reports improvement- over recent weeks to months reports has been better able to leave home, particularly if accompanied by her daughter and has been less avoidant of being outside the house. Mood described as stable.  She had recent episodes of chest pain , and describes being anxious about this because her mother passed away at age 45.  She saw cardiologist and was worked up with EKG and Echo. She was informed of results via phone and are noted in chart by PCP -appear reassuring   Currently tolerating medications well. Her preference is to continue Wellbutrin at current dose ( 150 mgr QDAY ) rather than taper further, and states this medication has been helpful and well tolerated .   Continue Wellbutrin XL dose down  to 150 mgr QDAY , Zoloft 200 mgr QDAY , Valium 5  mgr TID Side effects have been reviewed .     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  Will see in 4-6 weeks  Agrees to contact clinic sooner if any concerns  or worsening prior, also has number for crisis hotline if needed     Craige Cotta, MD 05/21/2022, 2:17 PM  Patient ID: Monica Ballard, female   DOB: 05-Nov-1975, 46 y.o.   MRN: [id number]

## 2022-06-20 ENCOUNTER — Telehealth (HOSPITAL_COMMUNITY): Payer: No Typology Code available for payment source | Admitting: Psychiatry

## 2022-06-24 ENCOUNTER — Other Ambulatory Visit (HOSPITAL_COMMUNITY): Payer: Self-pay | Admitting: Psychiatry

## 2022-07-03 ENCOUNTER — Encounter (HOSPITAL_COMMUNITY): Payer: Self-pay | Admitting: Psychiatry

## 2022-07-03 ENCOUNTER — Telehealth (HOSPITAL_BASED_OUTPATIENT_CLINIC_OR_DEPARTMENT_OTHER): Payer: No Typology Code available for payment source | Admitting: Psychiatry

## 2022-07-03 DIAGNOSIS — F3341 Major depressive disorder, recurrent, in partial remission: Secondary | ICD-10-CM

## 2022-07-03 DIAGNOSIS — F411 Generalized anxiety disorder: Secondary | ICD-10-CM | POA: Diagnosis not present

## 2022-07-03 MED ORDER — BUPROPION HCL ER (XL) 150 MG PO TB24: 150.0000 mg | ORAL_TABLET | ORAL | 1 refills | Status: DC

## 2022-07-03 MED ORDER — SERTRALINE HCL 100 MG PO TABS: 200.0000 mg | ORAL_TABLET | Freq: Every day | ORAL | 1 refills | Status: DC

## 2022-07-03 MED ORDER — LAMOTRIGINE 200 MG PO TABS: 200.0000 mg | ORAL_TABLET | Freq: Every day | ORAL | 1 refills | Status: DC

## 2022-07-03 MED ORDER — DIAZEPAM 5 MG PO TABS: 5.0000 mg | ORAL_TABLET | Freq: Three times a day (TID) | ORAL | 1 refills | Status: DC | PRN
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
## 2022-07-23 ENCOUNTER — Other Ambulatory Visit (HOSPITAL_COMMUNITY): Payer: Self-pay | Admitting: Psychiatry

## 2022-07-30 ENCOUNTER — Encounter (HOSPITAL_COMMUNITY): Payer: Self-pay | Admitting: Psychiatry

## 2022-07-30 ENCOUNTER — Other Ambulatory Visit (HOSPITAL_COMMUNITY): Payer: Self-pay | Admitting: Psychiatry

## 2022-07-30 MED ORDER — DIAZEPAM 5 MG PO TABS: 5.0000 mg | ORAL_TABLET | Freq: Three times a day (TID) | ORAL | 1 refills | Status: DC | PRN

## 2022-07-30 NOTE — Progress Notes (Signed)
Patient ID: Monica Ballard, female   DOB: 1976/05/12, 47 y.o.   MRN: [id number] 07/30/2022 Phone documentation  Patient's pharmacy sent in request for Valium to be renewed. Valium was last refilled on patient's previous appointment.  I spoke with Ms Gros via phone .  Reports she is currently doing well/stable.  She reports she has been taking her medications, including Valium, as prescribed.  No side effects reported. She does express concern that her current Valium bottle has no refills indicated on it.  I have renewed Valium at same dose 5 mg every 8 hours as needed for anxiety #90.  Patient has an upcoming appointment later this month.  She agrees to contact clinic should there be any concerns or worsening prior.  Gabriel Earing, MD

## 2022-08-13 ENCOUNTER — Encounter (HOSPITAL_COMMUNITY): Payer: Self-pay | Admitting: Psychiatry

## 2022-08-13 ENCOUNTER — Telehealth (HOSPITAL_BASED_OUTPATIENT_CLINIC_OR_DEPARTMENT_OTHER): Payer: No Typology Code available for payment source | Admitting: Psychiatry

## 2022-08-13 DIAGNOSIS — F324 Major depressive disorder, single episode, in partial remission: Secondary | ICD-10-CM | POA: Diagnosis not present

## 2022-08-13 MED ORDER — LAMOTRIGINE 200 MG PO TABS: 200.0000 mg | ORAL_TABLET | Freq: Every day | ORAL | 1 refills | Status: DC

## 2022-08-13 MED ORDER — SERTRALINE HCL 100 MG PO TABS: 200.0000 mg | ORAL_TABLET | Freq: Every day | ORAL | 1 refills | Status: DC

## 2022-08-13 MED ORDER — BUPROPION HCL ER (XL) 300 MG PO TB24: 300.0000 mg | ORAL_TABLET | ORAL | 1 refills | Status: DC

## 2022-08-13 MED ORDER — DIAZEPAM 5 MG PO TABS: 5.0000 mg | ORAL_TABLET | Freq: Three times a day (TID) | ORAL | 1 refills | Status: DC | PRN

## 2022-08-13 NOTE — Progress Notes (Signed)
BH MD/PA/NP OP Progress Note  08/13/2022 1:18 PM Monica Ballard  MRN:  [id number] This was a phone based medication management appointment.  Patient's identity verified using 2 different identifiers.  Limitations associated with this type of communication have been reviewed.  Location of parties [facility]   Duration 25 minutes  Chief Complaint:   Returns for medication management appointment  HPI: 47 yo married white female initially referred by her PCP for treatment of chronic depression and anxiety resistant to treatment. Reported history of chronic anxiety, depression over a period of years, with several antidepressant trials in the past that she did not feel worked.  She has no hx of mania, psychosis, alcohol or drug abuse.     Monica Ballard reports she has been feeling vaguely depressed, " like in a funk".  She reports decreased energy level and motivation . She denies having any suicidal or self injurious ideations . She denies hallucinations and no psychotic symptoms are noted or reported. She endorses history of chronic anxiety, associated with agoraphobia. Prefers to stay home and if she goes somewhere she is more comfortable if her daughter or other family member are with her .  She does not endorse any new or recent stressors. She does feel that mood may be partially affected by winter /cold weather .  Anxiety symptoms are reported as " the same ".   We reviewed medication options . She is on Zoloft at 200 mgr QDAY and on Wellbutrin XL now at 150 mgr QDAY ( down from 300 mgr QHS a couple of months ago) . She had been tolerating Wellbutrin XL well and does not endorse side effects. No history of seizures  . We have reviewed potential anxiety  side effects of Wellbutrin, but states she has not yet  noticed significant difference in anxiety symptoms related to Wellbutrin dose being decreased . Based on above, decided to increase Wellbutrin XL to 300 mgr QDAY ,  continue Zoloft at same dose    Visit Diagnosis:  No diagnosis found.  Past Psychiatric History: Please see intake H&P.  Past Medical History:  Past Medical History:  Diagnosis Date   Anemia    Anxiety    Asthma    smoker-uses nebulizer-not used in last year, albuterol rescue inhaler used in 11/13 preoperatively   Bipolar 1 disorder (HCC)    BMI 35.0-35.9,adult    Cervical disc disease    Chest pain    Chronic bronchitis (HCC)    nebulizer and rescue inhaler   Depression    Edema    GERD (gastroesophageal reflux disease)    occasional -tums   Headache(784.0)    Hearing loss in left ear    Hypercholesteremia    Hyperlipidemia    Hypertension    Menopausal symptoms    Missed ab 01/09/2012   10weeks-used cytotec   Numbness and tingling    PONV (postoperative nausea and vomiting)    Preterm labor    Right upper quadrant abdominal pain    SOB (shortness of breath)    Teeth decayed    lower , nothing loose-encouraged pt to seek dental care if necessary    Past Surgical History:  Procedure Laterality Date   ABDOMINAL HYSTERECTOMY     CHOLECYSTECTOMY     CHOLECYSTECTOMY, LAPAROSCOPIC  12/06/2010   DILATION AND CURETTAGE OF UTERUS  06/11/2012   Procedure: DILATATION AND CURETTAGE;  Surgeon: Thurnell Lose, MD;  Location: [facility];  Service: Gynecology;  Laterality: N/A;  sec  procedure start  [place]  01/16/2012   Procedure: DILATATION AND EVACUATION;  Surgeon: Thurnell Lose, MD;  Location: [facility];  Service: Gynecology;  Laterality: N/A;   LAPAROSCOPY  06/11/2012   Procedure: LAPAROSCOPY OPERATIVE;  Surgeon: Thurnell Lose, MD;  Location: [facility];  Service: Gynecology;  Laterality: N/A;   left ear drum rebuilt     x2   TYMPANOPLASTY Left 04/23/2016   Procedure: TYMPANOPLASTY LEFT EAR;  Surgeon: Izora Gala, MD;  Location: [facility];  Service: ENT;  Laterality: Left;   VAGINAL HYSTERECTOMY N/A 09/01/2012   Procedure: HYSTERECTOMY  VAGINAL;  Surgeon: Thurnell Lose, MD;  Location: [facility];  Service: Gynecology;  Laterality: N/A;    Family Psychiatric History: Reviewed.  Family History:  Family History  Problem Relation Age of Onset   Cancer Other        family hx   Diabetes Other        family hx   Depression Other        family hx    Coronary artery disease Mother        also MVD   Hyperlipidemia Mother    Hypertension Mother    Hypertension Father    Diabetes Father    Diabetes Maternal Grandmother    Colon cancer Paternal Grandmother    Breast cancer Maternal Aunt    Anesthesia problems Neg Hx     Social History:  Social History   Socioeconomic History   Marital status: Married    Spouse name: Not on file   Number of children: 2   Years of education: GED   Highest education level: Not on file  Occupational History   Occupation: Housewife  Tobacco Use   Smoking status: Former    Packs/day: 0.25    Years: 8.00    Total pack years: 2.00    Types: Cigarettes   Smokeless tobacco: Never   Tobacco comments:    up to 1/2 ppl x 8 years  Substance and Sexual Activity   Alcohol use: No   Drug use: No   Sexual activity: Yes    Birth control/protection: Surgical  Other Topics Concern   Not on file  Social History Narrative   Lives with daughter, son and husband in a one story home.  Has 2 children.  Does not work at this time.  Education: GED.    Right-handed.   4-5 twelve ounce cans per day.   Social Determinants of Health   Financial Resource Strain: Not on file  Food Insecurity: Not on file  Transportation Needs: Not on file  Physical Activity: Not on file  Stress: Not on file  Social Connections: Not on file    Allergies:  Allergies  Allergen Reactions   Penicillins Hives    Metabolic Disorder Labs: No results found for: "HGBA1C", "MPG" No results found for: "PROLACTIN" No results found for: "CHOL", "TRIG", "HDL", "CHOLHDL", "VLDL", "LDLCALC" Lab Results  Component Value  Date   TSH 3.270 12/22/2021    Therapeutic Level Labs: No results found for: "LITHIUM" No results found for: "VALPROATE" No results found for: "CBMZ"  Current Medications: Current Outpatient Medications  Medication Sig Dispense Refill   budesonide-formoterol (SYMBICORT) 160-4.5 MCG/ACT inhaler Inhale 2 puffs into the lungs 2 (two) times daily.     buPROPion (WELLBUTRIN XL) 150 MG 24 hr tablet Take 1 tablet (150 mg total) by mouth every morning. 30 tablet 1   cyanocobalamin 1000 MCG tablet Take 1,000  mcg by mouth daily.     diazepam (VALIUM) 5 MG tablet Take 1 tablet (5 mg total) by mouth every 8 (eight) hours as needed for anxiety. 90 tablet 1   furosemide (LASIX) 20 MG tablet Take 20 mg by mouth daily as needed.     lamoTRIgine (LAMICTAL) 200 MG tablet Take 1 tablet (200 mg total) by mouth at bedtime. 30 tablet 1   Multiple Vitamin (MULTIVITAMIN WITH MINERALS) TABS Take 1 tablet by mouth daily.     pantoprazole (PROTONIX) 40 MG tablet Take 1 tablet (40 mg total) by mouth daily. 30 tablet 0   sertraline (ZOLOFT) 100 MG tablet Take 2 tablets (200 mg total) by mouth daily. 60 tablet 1   No current facility-administered medications for this visit.     Psychiatric Specialty Exam: Please note limitations in obtaining a full mental status exam in the context of phone communication ROS - none reported    Last menstrual period 08/25/2012.There is no height or weight on file to calculate BMI.  General Appearance: NA  Eye Contact:  NA    Speech:  Clear and Coherent  Volume:  Normal  Mood:  describes mood  as vaguely depressed , " in a funk"  Affect: Presents vaguely constricted, but does tend to improve partially during visit  Thought Process:  Goal Directed  Orientation:  Full (Time, Place, and Person)  Thought Content: No hallucinations .  No delusions expressed.  No suicidal ideations, future oriented    Suicidal Thoughts:  No  denies having any  suicidal or self injurious ideations,  presents future oriented   Homicidal Thoughts:  No no violent or homicidal ideations.  Memory:  Grossly intact   Judgement:  Good  Insight:  Fair/improving  Psychomotor Activity:  NA  Concentration: grossly intact   Recall:  Good  Fund of Knowledge: Good  Language: Good  Akathisia:  Negative  Handed:  Right  AIMS (if indicated): not done  Assets:  Communication Skills Desire for Improvement Financial Resources/Insurance Housing Social Support Talents/Skills  ADL's:  Intact  Cognition: WNL  Sleep:  Fair    Assessment and Plan: 48 yo female with a history of chronic depression and anxiety.  She has been diagnosed with MDD and GAD.    Endorses long history of anxiety symptoms dating back to adolescence, to include symptoms of social anxiety and agoraphobia.  Monica Briley reports she has been feeling vaguely depressed, with decreased energy level and motivation . She does not endorse worsening or recent stressors. She denies SI. Wellbutrin XL had been decreased from 300 mgr  QDAY to 150 mgr QDAY about two months ago, to minimize potential for anxiety symptoms to be worsened by buproprion.  Denies medication side effects.   Reviewed options - will increase Wellbutrin XL back up to 300 mgr QDAY and continue Zoloft/Valium/Lamictal . Side effects have been reviewed .   Encouraged her to consider individual psychotherapy    Termination issues reviewed . Clinician will be leaving clinic in March 2024 , after which patient may continue to follow with another [facility] clinic clinician . She has expressed understanding .      Dx: MDD, chronic, no psychotic features / Dysthymia , Agoraphobia   Plan:  Zoloft 200 mg daily Lamictal 200 mgr QDAY  Increase Wellbutrin XL to 300  mg daily Valium 5 mgr TID PRN for anxiety Will see in 4-6 weeks  Agrees to contact clinic sooner if any concerns  or worsening prior, also has  number for crisis hotline if needed     Jenne Campus, MD 08/13/2022,  1:18 PM  Patient ID: Monica Ballard, female   DOB: 02/06/1976, 47 y.o.   MRN: [id number]

## 2022-09-11 ENCOUNTER — Telehealth (HOSPITAL_COMMUNITY): Payer: No Typology Code available for payment source | Admitting: Psychiatry

## 2022-09-14 ENCOUNTER — Other Ambulatory Visit (HOSPITAL_COMMUNITY): Payer: Self-pay | Admitting: Psychiatry

## 2022-09-24 ENCOUNTER — Other Ambulatory Visit (HOSPITAL_COMMUNITY): Payer: Self-pay | Admitting: Psychiatry

## 2022-09-25 ENCOUNTER — Encounter (HOSPITAL_COMMUNITY): Payer: Self-pay | Admitting: Psychiatry

## 2022-09-25 ENCOUNTER — Telehealth (HOSPITAL_BASED_OUTPATIENT_CLINIC_OR_DEPARTMENT_OTHER): Payer: No Typology Code available for payment source | Admitting: Psychiatry

## 2022-09-25 DIAGNOSIS — F321 Major depressive disorder, single episode, moderate: Secondary | ICD-10-CM

## 2022-09-25 MED ORDER — SERTRALINE HCL 100 MG PO TABS: 200.0000 mg | ORAL_TABLET | Freq: Every day | ORAL | 1 refills | Status: DC

## 2022-09-25 MED ORDER — BUPROPION HCL ER (XL) 150 MG PO TB24: 150.0000 mg | ORAL_TABLET | ORAL | 1 refills | Status: DC

## 2022-09-25 MED ORDER — ARIPIPRAZOLE 2 MG PO TABS: 2.0000 mg | ORAL_TABLET | Freq: Every day | ORAL | 1 refills | Status: DC

## 2022-09-25 MED ORDER — LAMOTRIGINE 200 MG PO TABS: 200.0000 mg | ORAL_TABLET | Freq: Every day | ORAL | 1 refills | Status: DC

## 2022-09-25 MED ORDER — DIAZEPAM 5 MG PO TABS: 5.0000 mg | ORAL_TABLET | Freq: Three times a day (TID) | ORAL | 1 refills | Status: DC | PRN

## 2022-09-25 NOTE — Progress Notes (Signed)
BH MD/PA/NP OP Progress Note  09/25/2022 10:45 AM Monica Ballard  MRN:  [id number] This was a phone based medication management appointment.  Patient's identity verified using 2 different identifiers.  Limitations associated with this type of communication have been reviewed.  Location of parties [facility]   Duration 20 minutes  Chief Complaint:   Returns for medication management appointment  HPI: 47 yo married white female initially referred by her PCP for treatment of chronic depression and anxiety resistant to treatment. Reported history of chronic anxiety, depression over a period of years, with several antidepressant trials in the past that she did not feel worked.  She has no hx of mania, psychosis, alcohol or drug abuse.     Ms Pense reports she has continued to experience depression and anxiety. She reports a vague feeling of depression, being " in a funk", with decreased motivation and a sense of anhedonia. Sleep variable . She denies having any suicidal or self injurious ideations .  She has a long history of anxiety and has described long history of agoraphobia, describing she prefers to stay home as she feels more anxious in public places .  She states she had been going out more recently accompanied by her daughter , but that recently she has felt less motivated to do so.  No hallucinations endorsed and no psychotic symptoms noted or reported .   Reports her family ( husband, daughter ) are very supportive .  Had recently increased Wellbutrin XL ( long term medication- reports has been on for years  ) to 300 mgr QDAY . Reports thus far has not noted significant mood improvement but does report she may be experiencing some increased anxiety .   History reviewed -history of chronic depression/anxiety(excessive worrying, also describes some social anxiety and agoraphobia as above ) which started in 2003 , at which time her mother and grandmother had passed  away . She reports that over the years she has been tried  on several different antidepressants ( fluoxetine, sertraline, paroxetine, trazodone, citalopram, bupropion, venlafaxine, duloxetine) . Reports did do well for a period of time on citalopram but " then it stopped working ". She does not endorse history of hypomanic/ manic episodes. Previous psychiatrist had noted potential for Bipolar spectrum disorder based on family history and on history of antidepressant failures- started lamotrigine which she has tolerated well .   We reviewed medication options . Reports increase of Wellbutrin XL from 150 mgr to 300 mg QDAY has not significantly helped mood but may have increased anxiety symptoms partially. Past history of positive but temporary response to citalopram, reports did not respond well to several other antidepressants . Her preference at this time is not to switch to another antidepressant as tolerating current meds well . We reviewed augmentation strategies and agrees to Abilify trial / low dose . Has been on aripiprazole before , apparently stopped due to nausea but expresses interest in retrying  .   Medication side effects have been reviewed, including potential risk of seizures on Wellbutrin ( has no history of seizures , denies history of eating disorder ) . Aware of potential for sedation and habituation on Valium. Denies history of misuse or abuse/takes as prescribed.     Visit Diagnosis:  No diagnosis found.  Past Psychiatric History: Please see intake H&P.  Past Medical History:  Past Medical History:  Diagnosis Date   Anemia    Anxiety    Asthma    smoker-uses nebulizer-not  used in last year, albuterol rescue inhaler used in 11/13 preoperatively   Bipolar 1 disorder (HCC)    BMI 35.0-35.9,adult    Cervical disc disease    Chest pain    Chronic bronchitis (HCC)    nebulizer and rescue inhaler   Depression    Edema    GERD (gastroesophageal reflux disease)    occasional  -tums   Headache(784.0)    Hearing loss in left ear    Hypercholesteremia    Hyperlipidemia    Hypertension    Menopausal symptoms    Missed ab 01/09/2012   10weeks-used cytotec   Numbness and tingling    PONV (postoperative nausea and vomiting)    Preterm labor    Right upper quadrant abdominal pain    SOB (shortness of breath)    Teeth decayed    lower , nothing loose-encouraged pt to seek dental care if necessary    Past Surgical History:  Procedure Laterality Date   ABDOMINAL HYSTERECTOMY     CHOLECYSTECTOMY     CHOLECYSTECTOMY, LAPAROSCOPIC  12/06/2010   DILATION AND CURETTAGE OF UTERUS  06/11/2012   Procedure: DILATATION AND CURETTAGE;  Surgeon: Thurnell Lose, MD;  Location: [facility];  Service: Gynecology;  Laterality: N/A;  sec procedure start  [place]  01/16/2012   Procedure: DILATATION AND EVACUATION;  Surgeon: Thurnell Lose, MD;  Location: [facility];  Service: Gynecology;  Laterality: N/A;   LAPAROSCOPY  06/11/2012   Procedure: LAPAROSCOPY OPERATIVE;  Surgeon: Thurnell Lose, MD;  Location: [facility];  Service: Gynecology;  Laterality: N/A;   left ear drum rebuilt     x2   TYMPANOPLASTY Left 04/23/2016   Procedure: TYMPANOPLASTY LEFT EAR;  Surgeon: Izora Gala, MD;  Location: [facility];  Service: ENT;  Laterality: Left;   VAGINAL HYSTERECTOMY N/A 09/01/2012   Procedure: HYSTERECTOMY VAGINAL;  Surgeon: Thurnell Lose, MD;  Location: [facility];  Service: Gynecology;  Laterality: N/A;    Family Psychiatric History: Reviewed.  Family History:  Family History  Problem Relation Age of Onset   Cancer Other        family hx   Diabetes Other        family hx   Depression Other        family hx    Coronary artery disease Mother        also MVD   Hyperlipidemia Mother    Hypertension Mother    Hypertension Father    Diabetes Father    Diabetes Maternal Grandmother    Colon cancer Paternal Grandmother    Breast cancer Maternal Aunt     Anesthesia problems Neg Hx     Social History:  Social History   Socioeconomic History   Marital status: Married    Spouse name: Not on file   Number of children: 2   Years of education: GED   Highest education level: Not on file  Occupational History   Occupation: Housewife  Tobacco Use   Smoking status: Former    Packs/day: 0.25    Years: 8.00    Total pack years: 2.00    Types: Cigarettes   Smokeless tobacco: Never   Tobacco comments:    up to 1/2 ppl x 8 years  Substance and Sexual Activity   Alcohol use: No   Drug use: No   Sexual activity: Yes    Birth control/protection: Surgical  Other Topics Concern   Not on file  Social History Narrative  Lives with daughter, son and husband in a one story home.  Has 2 children.  Does not work at this time.  Education: GED.    Right-handed.   4-5 twelve ounce cans per day.   Social Determinants of Health   Financial Resource Strain: Not on file  Food Insecurity: Not on file  Transportation Needs: Not on file  Physical Activity: Not on file  Stress: Not on file  Social Connections: Not on file    Allergies:  Allergies  Allergen Reactions   Penicillins Hives    Metabolic Disorder Labs: No results found for: "HGBA1C", "MPG" No results found for: "PROLACTIN" No results found for: "CHOL", "TRIG", "HDL", "CHOLHDL", "VLDL", "LDLCALC" Lab Results  Component Value Date   TSH 3.270 12/22/2021    Therapeutic Level Labs: No results found for: "LITHIUM" No results found for: "VALPROATE" No results found for: "CBMZ"  Current Medications: Current Outpatient Medications  Medication Sig Dispense Refill   budesonide-formoterol (SYMBICORT) 160-4.5 MCG/ACT inhaler Inhale 2 puffs into the lungs 2 (two) times daily.     buPROPion (WELLBUTRIN XL) 300 MG 24 hr tablet Take 1 tablet (300 mg total) by mouth every morning. 30 tablet 1   cyanocobalamin 1000 MCG tablet Take 1,000 mcg by mouth daily.     diazepam (VALIUM) 5 MG  tablet Take 1 tablet (5 mg total) by mouth every 8 (eight) hours as needed for anxiety. 90 tablet 1   furosemide (LASIX) 20 MG tablet Take 20 mg by mouth daily as needed.     lamoTRIgine (LAMICTAL) 200 MG tablet Take 1 tablet (200 mg total) by mouth at bedtime. 30 tablet 1   Multiple Vitamin (MULTIVITAMIN WITH MINERALS) TABS Take 1 tablet by mouth daily.     pantoprazole (PROTONIX) 40 MG tablet Take 1 tablet (40 mg total) by mouth daily. 30 tablet 0   sertraline (ZOLOFT) 100 MG tablet Take 2 tablets (200 mg total) by mouth daily. 60 tablet 1   No current facility-administered medications for this visit.     Psychiatric Specialty Exam: Please note limitations in obtaining a full mental status exam in the context of phone communication ROS - none reported    Last menstrual period 08/25/2012.There is no height or weight on file to calculate BMI.  General Appearance: NA  Eye Contact:  NA    Speech:  Clear and Coherent  Volume:  Normal  Mood:  endorses depression  Affect: appropriate   Thought Process:  Goal Directed  Orientation:  Full (Time, Place, and Person)  Thought Content: No hallucinations .  No delusions expressed.    Suicidal Thoughts:  No  denies having any  suicidal or self injurious ideations  Homicidal Thoughts:  No no violent or homicidal ideations.  Memory:  Grossly intact   Judgement:  Good  Insight:  Fair/improving  Psychomotor Activity:  NA  Concentration: grossly intact   Recall:  Good  Fund of Knowledge: Good  Language: Good  Akathisia:  Negative  Handed:  Right  AIMS (if indicated): not done  Assets:  Communication Skills Desire for Improvement Financial Resources/Insurance Housing Social Support Talents/Skills  ADL's:  Intact  Cognition: WNL  Sleep:  Fair    Assessment and Plan: 47 yo female with a history of chronic depression and anxiety.  She has been diagnosed with MDD and GAD.       Ms Shrout endorses persistent depression and reports decreased  energy level and motivation . She denies SI and identifies family members (she  has a  close family support system ) as protective factor . No psychotic symptoms. She has been on multiple antidepressants in the past and describes Wellbutrin /Zoloft as well tolerated and until recently felt mood had improved and had been better able to go out of the house with her daughter .  On last visit increased Wellbutrin XL from 150 mgr QDAY to 300 mgr QDAY, but reports no significant improvement in mood and possibly some increased anxiety.   Options reviewed and agrees to augmentation strategy, which she prefers to switching antidepressants at this time. In the past was tried on Abilify augmentation but had developed some nausea at 5 mgr QDAY and was stopped . Interested in retrying . She had also had individual psychotherapy in the past, but stopped after therapist left . We reviewed potential benefits of psychotherapy in addition to pharmacotherapy .    Termination issues reviewed . Clinician is leaving in March , will  follow with another [facility] clinic clinician  . She has expressed understanding .      Dx: MDD, chronic, no psychotic features / Dysthymia , Agoraphobia   Plan:  Zoloft 200 mg daily Decrease Wellbutrin XL to 150 mgr QDAY  Lamictal 200 mgr QDAY  Valium 5 mgr TID PRN for anxiety Start Abilify 2 mgr QDAY for augmentation  Have asked front desk staff to refer for individual psychotherapy and reviewed IOP as an option .  Next appointment in about 3-4 weeks  She agrees to contact clinic sooner if any concerns  or worsening prior, also has number for crisis hotline if needed     Jenne Campus, MD 09/25/2022, 10:45 AM  Patient ID: Monica Ballard, female   DOB: September 18, 1975, 47 y.o.   MRN: [id number]

## 2022-09-26 ENCOUNTER — Telehealth (HOSPITAL_COMMUNITY): Payer: Self-pay | Admitting: Psychiatry

## 2022-10-12 ENCOUNTER — Telehealth (HOSPITAL_COMMUNITY): Payer: Self-pay | Admitting: *Deleted

## 2022-10-12 NOTE — Telephone Encounter (Signed)
Writer spoke with Monica Ballard yesterday (10/10/22) after leaving voicemail to check in for Dr. Parke Poisson. Monica Ballard called with c/o that the Abilify 2 mg was making her feel nauseous with "flu like symptoms" but denies any fever, coughing, or other signs of URI or flu. Writer then spoke with Dr. Parke Poisson who advised that Monica Ballard can d/c the Abilify 2 mg and will be reassesd at her upcoming appointment on 10/24/22. Monica Ballard verbalized understanding and agreed to call this nurse/office with any questions or concerns.

## 2022-10-24 ENCOUNTER — Encounter (HOSPITAL_COMMUNITY): Payer: Self-pay | Admitting: Student in an Organized Health Care Education/Training Program

## 2022-10-24 ENCOUNTER — Telehealth (HOSPITAL_BASED_OUTPATIENT_CLINIC_OR_DEPARTMENT_OTHER)
Payer: No Typology Code available for payment source | Admitting: Student in an Organized Health Care Education/Training Program

## 2022-10-24 DIAGNOSIS — F411 Generalized anxiety disorder: Secondary | ICD-10-CM | POA: Diagnosis not present

## 2022-10-24 DIAGNOSIS — F339 Major depressive disorder, recurrent, unspecified: Secondary | ICD-10-CM

## 2022-10-24 DIAGNOSIS — R110 Nausea: Secondary | ICD-10-CM | POA: Diagnosis not present

## 2022-10-24 DIAGNOSIS — F4001 Agoraphobia with panic disorder: Secondary | ICD-10-CM | POA: Diagnosis not present

## 2022-10-24 MED ORDER — BUPROPION HCL ER (XL) 150 MG PO TB24: 150.0000 mg | ORAL_TABLET | ORAL | 1 refills | Status: DC

## 2022-10-24 MED ORDER — LAMOTRIGINE 200 MG PO TABS: 200.0000 mg | ORAL_TABLET | Freq: Every day | ORAL | 1 refills | Status: DC

## 2022-10-24 MED ORDER — OLANZAPINE 2.5 MG PO TABS
2.5000 mg | ORAL_TABLET | Freq: Every day | ORAL | 0 refills | Status: DC
Start: 2022-10-24 — End: 2022-11-08

## 2022-10-24 MED ORDER — DIAZEPAM 5 MG PO TABS: 5.0000 mg | ORAL_TABLET | Freq: Three times a day (TID) | ORAL | 0 refills | Status: DC | PRN

## 2022-10-24 MED ORDER — SERTRALINE HCL 100 MG PO TABS: 200.0000 mg | ORAL_TABLET | Freq: Every day | ORAL | 1 refills | Status: DC

## 2022-10-24 MED ORDER — PROMETHAZINE HCL 25 MG PO TABS: 25.0000 mg | ORAL_TABLET | Freq: Four times a day (QID) | ORAL | 0 refills | Status: DC | PRN

## 2022-10-24 NOTE — Progress Notes (Signed)
[facility] MD/PA/NP OP Progress Note   Virtual Visit via Video Note  I connected with Monica Ballard on 10/24/22 at  8:00 AM EDT by a video enabled telemedicine application and verified that I am speaking with the correct person using two identifiers.  Location: Patient: Home Provider: [facility]   I discussed the limitations of evaluation and management by telemedicine and the availability of in person appointments. The patient expressed understanding and agreed to proceed.   10/24/2022 10:01 AM Monica Ballard  MRN:  [id number]  Chief Complaint:  Chief Complaint  Patient presents with   Follow-up   Anxiety   Depression   HPI:  Monica Ballard is a 47 yr old female who presents via Virtual Video Visit for Follow Up and Medication Management.  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  She reports that there has been no change in her depression or anxiety.  She confirms that the Abilify caused flulike symptoms and that when she stopped the Abilify the symptoms resolved.  She reports she does continue to have nausea.  When asked about IOP per Dr. Eston Esters last note she reports that talking about things in individual therapy did not help and triggered her and so she has not pursued it because she thinks talk about in group will still be as triggering.  She reports she stays tired all the time and that her emotions are all over the place.  She reports going out with her daughter is not as often as it had been because this is no longer reducing her anxiety.  She reports when going out she will relive her traumas and have panic attacks.  She reports her family has been noting more and more how she simply looks unhappy.  She reports it is easy for her to go to sleep but she will only sleep 30 minutes to an hour at most.  Discussed if she had ever pursued [facility].  She reports that in the past she had  been quoted over $100 co-pay per session and that it was too expensive.  Encouraged her to call her insurance to see if coverage had changed because given a number of failed medication trials she does have treatment resistant depression and TMS would be worth pursuing.  Discussed starting low-dose Zyprexa to augment her Zoloft and Wellbutrin.  Discuss potential risks and side effects and she was agreeable to the trial.  Confirmed with her that she is using the Valium 3 times a day almost every day.  She asked for a refill of her promethazine as she had been on it for a long time but it had fallen off of her chart and she did not know why.  Discussed that we could restart this as well.  She reports no SI, HI, or AVH.  She reports her sleep is poor.  She reports her appetite is poor.  She reports nausea, dizziness, and headaches and mild shortness of breath which are chronic but otherwise reports no other concerns at present.  She will return for follow-up in approximately 4 weeks.  Discussed with patient that Resident Provider would be transitioning their care to another Resident Provider starting July 2024. She reported understanding and no other concerns at present.    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 GAD (generalized anxiety disorder)  F41.1 sertraline (ZOLOFT) 100 MG tablet    lamoTRIgine (LAMICTAL) 200 MG tablet    buPROPion (WELLBUTRIN XL) 150 MG 24 hr tablet    OLANZapine (ZYPREXA) 2.5 MG tablet    3. Nausea without vomiting  R11.0 promethazine (PHENERGAN) 25 MG tablet    4. Panic disorder with agoraphobia  F40.01 diazepam (VALIUM) 5 MG tablet    OLANZapine (ZYPREXA) 2.5 MG tablet      Past Psychiatric History: Treatment Resistant Depression and Anxiety, and no  history of Suicide Attempts, Self Injurious Behavior, or Psychiatric Hospitalizations.  Past Medical History:  Past Medical History:  Diagnosis Date   Anemia    Anxiety    Asthma    smoker-uses nebulizer-not used in last year, albuterol rescue inhaler used in 11/13 preoperatively   Bipolar 1 disorder (HCC)    BMI 35.0-35.9,adult    Cervical disc disease    Chest pain    Chronic bronchitis (HCC)    nebulizer and rescue inhaler   Depression    Edema    GERD (gastroesophageal reflux disease)    occasional -tums   Headache(784.0)    Hearing loss in left ear    Hypercholesteremia    Hyperlipidemia    Hypertension    Menopausal symptoms    Missed ab 01/09/2012   10weeks-used cytotec   Numbness and tingling    PONV (postoperative nausea and vomiting)    Preterm labor    Right upper quadrant abdominal pain    SOB (shortness of breath)    Teeth decayed    lower , nothing loose-encouraged pt to seek dental care if necessary    Past Surgical History:  Procedure Laterality Date   ABDOMINAL HYSTERECTOMY     CHOLECYSTECTOMY     CHOLECYSTECTOMY, LAPAROSCOPIC  12/06/2010   DILATION AND CURETTAGE OF UTERUS  06/11/2012   Procedure: DILATATION AND CURETTAGE;  Surgeon: Thurnell Lose, MD;  Location: [facility];  Service: Gynecology;  Laterality: N/A;  sec procedure start  [place]  01/16/2012   Procedure: DILATATION AND EVACUATION;  Surgeon: Thurnell Lose, MD;  Location: [facility];  Service: Gynecology;  Laterality: N/A;   LAPAROSCOPY  06/11/2012   Procedure: LAPAROSCOPY OPERATIVE;  Surgeon: Thurnell Lose, MD;  Location: [facility];  Service: Gynecology;  Laterality: N/A;   left ear drum rebuilt     x2   TYMPANOPLASTY Left 04/23/2016   Procedure: TYMPANOPLASTY LEFT EAR;  Surgeon: Izora Gala, MD;  Location: [facility];  Service: ENT;  Laterality: Left;   VAGINAL HYSTERECTOMY N/A 09/01/2012   Procedure: HYSTERECTOMY VAGINAL;  Surgeon: Thurnell Lose, MD;   Location: [facility];  Service: Gynecology;  Laterality: N/A;    Family Psychiatric History: 26 brother with bipolar disorder/ADHD.   Family History:  Family History  Problem Relation Age of Onset   Cancer Other        family hx   Diabetes Other        family hx   Depression Other        family hx    Coronary artery disease Mother        also MVD   Hyperlipidemia Mother    Hypertension Mother    Hypertension Father    Diabetes Father    Diabetes Maternal Grandmother    Colon cancer  Paternal Grandmother    Breast cancer Maternal Aunt    Anesthesia problems Neg Hx     Social History:  Social History   Socioeconomic History   Marital status: Married    Spouse name: Not on file   Number of children: 2   Years of education: GED   Highest education level: Not on file  Occupational History   Occupation: Housewife  Tobacco Use   Smoking status: Former    Packs/day: 0.25    Years: 8.00    Additional pack years: 0.00    Total pack years: 2.00    Types: Cigarettes   Smokeless tobacco: Never   Tobacco comments:    up to 1/2 ppl x 8 years  Substance and Sexual Activity   Alcohol use: No   Drug use: No   Sexual activity: Yes    Birth control/protection: Surgical  Other Topics Concern   Not on file  Social History Narrative   Lives with daughter, son and husband in a one story home.  Has 2 children.  Does not work at this time.  Education: GED.    Right-handed.   4-5 twelve ounce cans per day.   Social Determinants of Health   Financial Resource Strain: Not on file  Food Insecurity: Not on file  Transportation Needs: Not on file  Physical Activity: Not on file  Stress: Not on file  Social Connections: Not on file    Allergies:  Allergies  Allergen Reactions   Penicillins Hives    Metabolic Disorder Labs: No results found for: "HGBA1C", "MPG" No results found for: "PROLACTIN" No results found for: "CHOL", "TRIG", "HDL", "CHOLHDL", "VLDL", "LDLCALC" Lab  Results  Component Value Date   TSH 3.270 12/22/2021    Therapeutic Level Labs: No results found for: "LITHIUM" No results found for: "VALPROATE" No results found for: "CBMZ"  Current Medications: Current Outpatient Medications  Medication Sig Dispense Refill   OLANZapine (ZYPREXA) 2.5 MG tablet Take 1 tablet (2.5 mg total) by mouth at bedtime. 30 tablet 0   promethazine (PHENERGAN) 25 MG tablet Take 1 tablet (25 mg total) by mouth every 6 (six) hours as needed for nausea or vomiting. 30 tablet 0   budesonide-formoterol (SYMBICORT) 160-4.5 MCG/ACT inhaler Inhale 2 puffs into the lungs 2 (two) times daily.     buPROPion (WELLBUTRIN XL) 150 MG 24 hr tablet Take 1 tablet (150 mg total) by mouth every morning. 30 tablet 1   cyanocobalamin 1000 MCG tablet Take 1,000 mcg by mouth daily.     diazepam (VALIUM) 5 MG tablet Take 1 tablet (5 mg total) by mouth every 8 (eight) hours as needed for anxiety. 90 tablet 0   furosemide (LASIX) 20 MG tablet Take 20 mg by mouth daily as needed.     lamoTRIgine (LAMICTAL) 200 MG tablet Take 1 tablet (200 mg total) by mouth at bedtime. 30 tablet 1   Multiple Vitamin (MULTIVITAMIN WITH MINERALS) TABS Take 1 tablet by mouth daily.     pantoprazole (PROTONIX) 40 MG tablet Take 1 tablet (40 mg total) by mouth daily. 30 tablet 0   sertraline (ZOLOFT) 100 MG tablet Take 2 tablets (200 mg total) by mouth daily. 60 tablet 1   No current facility-administered medications for this visit.     Musculoskeletal: Strength & Muscle Tone: within normal limits Gait & Station:  sitting during interview Patient leans: N/A  Psychiatric Specialty Exam: Review of Systems  Respiratory:  Positive for shortness of breath.   Cardiovascular:  Negative for chest pain.  Gastrointestinal:  Positive for nausea. Negative for abdominal pain, constipation, diarrhea and vomiting.  Neurological:  Positive for dizziness and headaches. Negative for weakness.  Psychiatric/Behavioral:   Positive for dysphoric mood and sleep disturbance. Negative for hallucinations. The patient is nervous/anxious.     Last menstrual period 08/25/2012.There is no height or weight on file to calculate BMI.  General Appearance: Casual and Fairly Groomed  Eye Contact:  Good  Speech:  Clear and Coherent and Normal Rate  Volume:  Normal  Mood:  Anxious and Dysphoric  Affect:  Congruent, Depressed, and Flat  Thought Process:  Coherent and Goal Directed  Orientation:  Full (Time, Place, and Person)  Thought Content: WDL and Logical   Suicidal Thoughts:  No  Homicidal Thoughts:  No  Memory:  Immediate;   Good Recent;   Good  Judgement:  Good  Insight:  Good  Psychomotor Activity:  Normal  Concentration:  Concentration: Good and Attention Span: Good  Recall:  Good  Fund of Knowledge: Good  Language: Good  Akathisia:  Negative  Handed:  Right  AIMS (if indicated): not done  Assets:  Communication Skills Desire for Improvement Financial Resources/Insurance Housing Resilience Social Support  ADL's:  Intact  Cognition: WNL  Sleep:  Poor   Screenings: Camera operator Row Counselor from 04/04/2021 in [place] at [facility] Video Visit from 09/26/2020 in [facility]  PHQ-2 Total Score 4 5  PHQ-9 Total Score 17 13      Flowsheet Row ED from 04/19/2022 in [facility] at [facility] Counselor from 04/04/2021 in [place] at [facility] Video Visit from 09/26/2020 in [facility] No Risk No Risk No Risk        Assessment and Plan:  Monica Ballard is a 47 yr old female who presents via Virtual Video Visit for Follow Up and Medication Management.  PPHx is significant for Treatment Resistant Depression and Anxiety, and no history of Suicide Attempts, Self Injurious Behavior, or Psychiatric  Hospitalizations.   Monica Ballard continues to be significantly impaired by her depression and anxiety.  As she was having side effects to the Abilify as she had already stopped it.  Given her numerous failed medication trials she would be a good candidate for [place], however, price was an issue in the past so she will be reaching out to her insurance to see if coverage has changed/improved.  We will trial low-dose Zyprexa to augment her other medications given how disruptive to her total life her symptoms are.  We will sned in a refill of her Phenergan We will not make any other changes to her medications at this time.  She will return for follow-up in approximately 4 weeks.   Treatment Resistant Depression and Anxiety -Continue Zoloft 200 mg daily for depression and anxiety.  60 (100 mg) tablets with 1 refill. -Continue Wellbutrin XL 150 mg daily for depression and anxiety.  30 tablets with 1 refill. -Continue Lamictal 200 mg daily for mood stability.  30 tablets with 1 refill. -Continue Valium 5 mgr TID PRN for anxiety.  90 tablets with 0 refills.  Per PDMP last fill 3/11. -Start Zyprexa 2.5 mg QHS for augmentation.   30 tablets with 1 refill. Already stopped Abilify.   Nausea: -Restart Phenergan 25 mg q6 PRN.  30 tablets with 0 refills.    Previous Med Trials:  fluoxetine, sertraline, paroxetine, trazodone, citalopram, bupropion, venlafaxine, duloxetine, Abilify    Collaboration of Care:   Patient/Guardian was advised Release of Information must be obtained prior to any record release in order to collaborate their care with an outside provider. Patient/Guardian was advised if they have not already done so to contact the registration department to sign all necessary forms in order for [place] to release information regarding their care.   Consent: Patient/Guardian gives verbal consent for treatment and assignment of benefits for services provided during this visit. Patient/Guardian expressed  understanding and agreed to proceed.    Monica Cedar, MD 10/24/2022, 10:01 AM   Follow Up Instructions:    I discussed the assessment and treatment plan with the patient. The patient was provided an opportunity to ask questions and all were answered. The patient agreed with the plan and demonstrated an understanding of the instructions.   The patient was advised to call back or seek an in-person evaluation if the symptoms worsen or if the condition fails to improve as anticipated.  I provided 50 minutes of non-face-to-face time during this encounter.   Monica Cedar, MD

## 2022-11-05 ENCOUNTER — Telehealth (HOSPITAL_COMMUNITY): Payer: Self-pay | Admitting: *Deleted

## 2022-11-05 NOTE — Telephone Encounter (Signed)
Called her back at, [phone].  She reports that since starting Zyprexa she has had headaches and dizziness.  She reports her nausea has also gotten worse.  Discussed with her to stop taking the Zyprexa but continue her other medications.  She reports she is going to see her PCP today because she is having ear pain.  Encouraged her to discuss this long-term nausea and vomiting with her PCP as further workup may be needed since this has been an ongoing issue.  Encouraged her to talk with her PCP about further prescriptions of antinausea medicine.  Discussed we would have an appointment to follow-up in office made issues agreeable with this.  She reports no SI or HI.  She reports no other concerns at present.   Changes: Stop Zyprexa     Arna Snipe MD Resident

## 2022-11-05 NOTE — Telephone Encounter (Signed)
Pt LVM stating that she was still having n/v from the Zyprexa now. Olanzapine 2.5 mg QHS started after last visit on 10/24/22. Pt is requesting refill of the Promethazine 25 mg Q 6 hours PRN for n/v. Rx e-scribed on 10/24/22. Pt does not have a f/u appointment scheduled. Attempts to contact pt unsuccessful as VM is full. FYI.

## 2022-11-08 ENCOUNTER — Telehealth (HOSPITAL_BASED_OUTPATIENT_CLINIC_OR_DEPARTMENT_OTHER)
Payer: No Typology Code available for payment source | Admitting: Student in an Organized Health Care Education/Training Program

## 2022-11-08 ENCOUNTER — Encounter (HOSPITAL_COMMUNITY): Payer: Self-pay | Admitting: Student in an Organized Health Care Education/Training Program

## 2022-11-08 DIAGNOSIS — F339 Major depressive disorder, recurrent, unspecified: Secondary | ICD-10-CM

## 2022-11-08 DIAGNOSIS — F411 Generalized anxiety disorder: Secondary | ICD-10-CM

## 2022-11-08 DIAGNOSIS — F4001 Agoraphobia with panic disorder: Secondary | ICD-10-CM

## 2022-11-08 MED ORDER — QUETIAPINE FUMARATE 50 MG PO TABS
50.0000 mg | ORAL_TABLET | Freq: Every day | ORAL | 1 refills | Status: DC
Start: 2022-11-19 — End: 2022-12-20

## 2022-11-08 NOTE — Progress Notes (Signed)
BH MD/PA/NP OP Progress Note  Virtual Visit via Video Note  I connected with Monica Ballard on 11/08/22 at  8:00 AM EDT by a video enabled telemedicine application and verified that I am speaking with the correct person using two identifiers.  Location: Patient: Home Provider: Ninfa Meeker   I discussed the limitations of evaluation and management by telemedicine and the availability of in person appointments. The patient expressed understanding and agreed to proceed.  11/08/2022 8:35 AM Monica Ballard  MRN:  [id number]  Chief Complaint:  Chief Complaint  Patient presents with   Follow-up   Medication Reaction   Depression   Anxiety   HPI:  Monica Ballard. Monica Ballard is a 47 yr old female who presents via Virtual Video Visit for Follow Up and Medication Management.  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   She reports that she is feeling pretty bad today because she has a sinus and ear infection.  She reports that due to all of the symptoms of that she is having poor sleep and poor appetite.  She reports that she is not sure of the difference since stopping Zyprexa on 4/15 because of all of the other issues going on right now.  She reports that she is currently taking cefazolin and is on her second of 10 days.  She reports that while she was on the Zyprexa she did have memory issues, dizziness, and felt like she was in a haze.  However, she does report that her depression and anxiety symptoms did improve and that her daughter even mentioned that there she noticed an improvement.  Discussed trialing Seroquel as this is related to Zyprexa but is less potent and can be given in a smaller equivalent dose than the Zyprexa.  Discussed with her that we would need to wait until she had completely finished the cefazolin and her symptoms resolved so that we can get a clear picture of her response to  the Seroquel.  She reported understanding of this plan.  Discussed potential risks and side effects of Seroquel and she was agreeable to the trial.  Discussed with her that she will start the Seroquel on 4/29 and she was agreeable with this.  Discussed we would not make any other changes to her medications at this time.  Did discuss potential medication reduction if the Seroquel is effective.  Discussed with her we would consider the Wellbutrin because that is a low-dose or the Valium but that no changes will be made right now.  She reports no SI, HI, or AVH.  She reports congestion, cough, shortness of breath, postnasal drip, sinus pressure, headaches, dizziness, and nausea from her sinus and ear infection.  She reports no other concerns at present.  She will return for follow-up in approximately 4 to 5 weeks.  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 GAD (generalized anxiety disorder)  F41.1 QUEtiapine (SEROQUEL) 50 MG tablet    3. Panic disorder with agoraphobia  F40.01 QUEtiapine (SEROQUEL) 50 MG tablet      Past Psychiatric History: Treatment Resistant Depression and Anxiety, and no history of Suicide Attempts, Self Injurious Behavior, or Psychiatric Hospitalizations.   Past Medical History:  Past Medical History:  Diagnosis Date   Anemia    Anxiety    Asthma    smoker-uses  nebulizer-not used in last year, albuterol rescue inhaler used in 11/13 preoperatively   Bipolar 1 disorder    BMI 35.0-35.9,adult    Cervical disc disease    Chest pain    Chronic bronchitis    nebulizer and rescue inhaler   Depression    Edema    GERD (gastroesophageal reflux disease)    occasional -tums   Headache(784.0)    Hearing loss in left ear    Hypercholesteremia    Hyperlipidemia    Hypertension    Menopausal symptoms    Missed ab 01/09/2012   10weeks-used cytotec   Numbness and tingling    PONV  (postoperative nausea and vomiting)    Preterm labor    Right upper quadrant abdominal pain    SOB (shortness of breath)    Teeth decayed    lower , nothing loose-encouraged pt to seek dental care if necessary    Past Surgical History:  Procedure Laterality Date   ABDOMINAL HYSTERECTOMY     CHOLECYSTECTOMY     CHOLECYSTECTOMY, LAPAROSCOPIC  12/06/2010   DILATION AND CURETTAGE OF UTERUS  06/11/2012   Procedure: DILATATION AND CURETTAGE;  Surgeon: Geryl Rankins, MD;  Location: [facility];  Service: Gynecology;  Laterality: N/A;  sec procedure start  1241   DILATION AND EVACUATION  01/16/2012   Procedure: DILATATION AND EVACUATION;  Surgeon: Geryl Rankins, MD;  Location: [facility];  Service: Gynecology;  Laterality: N/A;   LAPAROSCOPY  06/11/2012   Procedure: LAPAROSCOPY OPERATIVE;  Surgeon: Geryl Rankins, MD;  Location: [facility];  Service: Gynecology;  Laterality: N/A;   left ear drum rebuilt     x2   TYMPANOPLASTY Left 04/23/2016   Procedure: TYMPANOPLASTY LEFT EAR;  Surgeon: Serena Colonel, MD;  Location: [facility];  Service: ENT;  Laterality: Left;   VAGINAL HYSTERECTOMY N/A 09/01/2012   Procedure: HYSTERECTOMY VAGINAL;  Surgeon: Geryl Rankins, MD;  Location: [facility];  Service: Gynecology;  Laterality: N/A;    Family Psychiatric History: Half brother- Bipolar disorder, ADHD.   Family History:  Family History  Problem Relation Age of Onset   Cancer Other        family hx   Diabetes Other        family hx   Depression Other        family hx    Coronary artery disease Mother        also MVD   Hyperlipidemia Mother    Hypertension Mother    Hypertension Father    Diabetes Father    Diabetes Maternal Grandmother    Colon cancer Paternal Grandmother    Breast cancer Maternal Aunt    Anesthesia problems Neg Hx     Social History:  Social History   Socioeconomic History   Marital status: Married    Spouse name: Not on file   Number of children: 2   Years of  education: GED   Highest education level: Not on file  Occupational History   Occupation: Housewife  Tobacco Use   Smoking status: Former    Packs/day: 0.25    Years: 8.00    Additional pack years: 0.00    Total pack years: 2.00    Types: Cigarettes   Smokeless tobacco: Never   Tobacco comments:    up to 1/2 ppl x 8 years  Substance and Sexual Activity   Alcohol use: No   Drug use: No   Sexual activity: Yes    Birth control/protection: Surgical  Other Topics  Concern   Not on file  Social History Narrative   Lives with daughter, son and husband in a one story home.  Has 2 children.  Does not work at this time.  Education: GED.    Right-handed.   4-5 twelve ounce cans per day.   Social Determinants of Health   Financial Resource Strain: Not on file  Food Insecurity: Not on file  Transportation Needs: Not on file  Physical Activity: Not on file  Stress: Not on file  Social Connections: Not on file    Allergies:  Allergies  Allergen Reactions   Penicillins Hives    Metabolic Disorder Labs: No results found for: "HGBA1C", "MPG" No results found for: "PROLACTIN" No results found for: "CHOL", "TRIG", "HDL", "CHOLHDL", "VLDL", "LDLCALC" Lab Results  Component Value Date   TSH 3.270 12/22/2021    Therapeutic Level Labs: No results found for: "LITHIUM" No results found for: "VALPROATE" No results found for: "CBMZ"  Current Medications: Current Outpatient Medications  Medication Sig Dispense Refill   [START ON 11/19/2022] QUEtiapine (SEROQUEL) 50 MG tablet Take 1 tablet (50 mg total) by mouth at bedtime. 30 tablet 1   budesonide-formoterol (SYMBICORT) 160-4.5 MCG/ACT inhaler Inhale 2 puffs into the lungs 2 (two) times daily.     buPROPion (WELLBUTRIN XL) 150 MG 24 hr tablet Take 1 tablet (150 mg total) by mouth every morning. 30 tablet 1   cyanocobalamin 1000 MCG tablet Take 1,000 mcg by mouth daily.     diazepam (VALIUM) 5 MG tablet Take 1 tablet (5 mg total) by  mouth every 8 (eight) hours as needed for anxiety. 90 tablet 0   furosemide (LASIX) 20 MG tablet Take 20 mg by mouth daily as needed.     lamoTRIgine (LAMICTAL) 200 MG tablet Take 1 tablet (200 mg total) by mouth at bedtime. 30 tablet 1   Multiple Vitamin (MULTIVITAMIN WITH MINERALS) TABS Take 1 tablet by mouth daily.     pantoprazole (PROTONIX) 40 MG tablet Take 1 tablet (40 mg total) by mouth daily. 30 tablet 0   promethazine (PHENERGAN) 25 MG tablet Take 1 tablet (25 mg total) by mouth every 6 (six) hours as needed for nausea or vomiting. 30 tablet 0   sertraline (ZOLOFT) 100 MG tablet Take 2 tablets (200 mg total) by mouth daily. 60 tablet 1   No current facility-administered medications for this visit.     Musculoskeletal: Strength & Muscle Tone: within normal limits Gait & Station:  laying in bed Patient leans: N/A  Psychiatric Specialty Exam: Review of Systems  HENT:  Positive for congestion, postnasal drip and sinus pressure.   Respiratory:  Positive for cough and shortness of breath.   Cardiovascular:  Negative for chest pain.  Gastrointestinal:  Positive for nausea. Negative for abdominal pain, constipation, diarrhea and vomiting.  Neurological:  Positive for dizziness and headaches. Negative for weakness.  Psychiatric/Behavioral:  Positive for dysphoric mood and sleep disturbance. Negative for hallucinations and suicidal ideas. The patient is nervous/anxious.     Last menstrual period 08/25/2012.There is no height or weight on file to calculate BMI.  General Appearance: Disheveled sick  Eye Contact:  Good  Speech:  Clear and Coherent and Normal Rate  Volume:  Normal  Mood:   "not so good"  Affect:  Congruent  Thought Process:  Coherent and Goal Directed  Orientation:  Full (Time, Place, and Person)  Thought Content: WDL and Logical   Suicidal Thoughts:  No  Homicidal Thoughts:  No  Memory:  Immediate;   Good Recent;   Good  Judgement:  Good  Insight:  Good   Psychomotor Activity:  Normal  Concentration:  Concentration: Good and Attention Span: Good  Recall:  Good  Fund of Knowledge: Good  Language: Good  Akathisia:  Negative  Handed:  Right  AIMS (if indicated): not done  Assets:  Communication Skills Desire for Improvement Financial Resources/Insurance Housing Resilience Social Support  ADL's:  Intact  Cognition: WNL  Sleep:  Poor   Screenings: Oceanographer Row Counselor from 04/04/2021 in [facility] Video Visit from 09/26/2020 in [facility]  PHQ-2 Total Score 4 5  PHQ-9 Total Score 17 13      Flowsheet Row ED from 04/19/2022 in [facility] Counselor from 04/04/2021 in [facility] Video Visit from 09[facility]26/2020 in [facility]  C-SSRS RISK CATEGORY No Risk No Risk No Risk        Assessment and Plan:  Monica Ballard. Monica Ballard is a 47 yr old female who presents via Virtual Video Visit for Follow Up and Medication Management.  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    Monica Ballard had worsening of her nausea and significant side effects to the Zyprexa and so it was stopped on 4/15.  However, she did notice an improvement in her depression and anxiety but the side effects were to severe so we will trial low-dose Seroquel.  Currently she has a sinus and ear infection and is on cefazolin for another 8 days, therefore, she will start the Seroquel on 4/29 to give [place] a clear picture of her response to the Seroquel.  We will not make any other changes to her medications at this time.  She will return for follow-up in approximately 4 to 5 weeks.   Treatment Resistant Depression and Anxiety -Continue Zoloft 200 mg daily for  depression and anxiety.  No refills sent at this time.  -Continue Wellbutrin XL 150 mg daily for depression and anxiety.  No refills sent at this time.  -Continue Lamictal 200 mg daily for mood stability.  No refills sent at this time.  -Continue Valium 5 mgr TID PRN for anxiety.  No refills sent at this time.  -Start Seroquel 50 mg QHS for augmentation.  No refills sent at this time.  -Stopped Zyprexa       Previous Med Trials:  fluoxetine, sertraline, paroxetine, trazodone, citalopram, bupropion, venlafaxine, duloxetine, Abilify, Zyprexa    Collaboration of Care:   Patient/Guardian was advised Release of Information must be obtained prior to any record release in order to collaborate their care with an outside provider. Patient/Guardian was advised if they have not already done so to contact the registration department to sign all necessary forms in order for [place] to release information regarding their care.   Consent: Patient/Guardian gives verbal consent for treatment and assignment of benefits for services provided during this visit. Patient/Guardian expressed understanding and agreed to proceed.    Lauro Franklin, MD 11/08/2022, 8:35 AM   Follow Up Instructions:    I discussed the assessment and treatment plan with the patient. The patient was provided an opportunity to ask questions and all were answered. The patient agreed with the plan and demonstrated an understanding of the instructions.   The patient was advised to  call back or seek an in-person evaluation if the symptoms worsen or if the condition fails to improve as anticipated.  I provided 22 minutes of non-face-to-face time during this encounter.   Lauro Franklin, MD

## 2022-11-13 ENCOUNTER — Encounter: Payer: Self-pay | Admitting: Gastroenterology

## 2022-11-14 ENCOUNTER — Telehealth (HOSPITAL_COMMUNITY)
Payer: No Typology Code available for payment source | Admitting: Student in an Organized Health Care Education/Training Program

## 2022-11-25 NOTE — Progress Notes (Unsigned)
GI Office Note    Referring Provider: Donetta Potts, MD Primary Care Physician:  Donetta Potts, MD  Primary Gastroenterologist: Gerrit Friends.Rourk, MD  Chief Complaint   Chief Complaint  Patient presents with   Nausea    Nausea a lot. Has vomited a couple of times. Has a numb pain on upper left side. IBS.    History of Present Illness   Monica Ballard is a 47 y.o. female presenting today at the request of Donetta Potts, MD for nausea, abdominal pain, IBS constipation.  Colonoscopy in 2003 by Dr. Arlyce Dice: -Completely normal colonoscopy  EGD with [facility] July 2019: -Normal esophagus -Normal duodenum -Gastritis s/p biopsy -Hiatal hernia 2 cm -Advised PPI daily -Pathology report not provided.  Advised to perform colonoscopy in the near future.  Patient seen by [facility] gastroenterology in January 2020 with report of lower and generalized crampy abdominal pain usually relieved by bowel movement.  Well-controlled with Bentyl.  Does not constipation and gets bloated with MiraLAX twice daily.  History of diaphragmatic hernia in 2019.  History of reflux with primarily nocturnal symptoms despite Protonix.  Noted family history of colon cancer in her paternal great grandmother and paternal grandmother.  Also with complaints of nausea.  She was advised to take mag citrate for 1 day and then start MiraLAX 17 g at bedtime for 30 days.  Dicyclomine refilled.  Advised to continue pantoprazole.  Given Phenergan to use as needed for nausea.  Also advised famotidine 20 mg at bedtime.  GERD diet discussed.  Labs in August 2023 with hemoglobin 14.6, platelets 347, normal CMP.  Normal TSH.  Low B12 at 227  At patient's last PCP visit 11/05/2022 she reportedly had been seeing GI for nausea but her physician retired therefore she requested referral to new GI.  History of cholecystectomy in 2002.   Today: Last colonoscopy - possibly in 2020 or at time of EGD. Unsure when she is due for  repeat. Does not believe she has had polyps.   Has dealt with chronic nausea for a few years but reports it has worsened in the last year or so. States he has had an ultrasound due to tenderness under her left rib cage almost numb and under her right side she feels liek something has cramped under there and hurts in the middle of her back more on the left side and at times she needs dicyclomine and gas ex with it (has to rock back and forth and apply heat and once medications kick in she will burp and pass gas and then it eases off some). Sometimes the pain will occur right after eating and then she feels like she needs to go but then unable to go. Has bristol 1 stools. Has been up to 10 days without a BM. Denies any prior injuries to her back or ribs. Does have DDD however. Zofran has not been helpful but does get relief from phenergan. Has been eating a probiotic yogurt daily. Has not had good relief with miralax in the past and has tried benefiber and metamucil. Was given Linzess in the past and had diarrhea. She in unsure the dose.   Has been on pantoprazole 40 mg once daily for years. Recently increased to twice daily but does believe it is helping. Wakes up with regurgitation. Was taking phenergan regularly. Denies family history of celiac or gastroparesis.   Denies NSAIDs. Quit smoking years ago. No alcohol use. No vaping.    Reports  history of anxiety and when she is anxious she gets bristol 4 stools that keep coming  within a 30 min to an hour time frame would have multiple larger BMs. Sometimes abdominal pain improves with bowel movements. Her left sided pain has woken her up. Feels more bloated in the upper abdomen.   Mostly stools are dark but reports it has a different medicine type smell and sometimes sticky. No brbpr. Unsure if history of hemorrhoids.   Paternal great grandmother and possibly paternal grandmother with colon cancer. Unaware if her father has had polyps. Maternal grandmother  had polyps.   Reports recent blood work done at [facility].    Current Outpatient Medications  Medication Sig Dispense Refill   atorvastatin (LIPITOR) 20 MG tablet Take 20 mg by mouth daily.     budesonide-formoterol (SYMBICORT) 160-4.5 MCG/ACT inhaler Inhale 2 puffs into the lungs 2 (two) times daily.     buPROPion (WELLBUTRIN XL) 150 MG 24 hr tablet Take 1 tablet (150 mg total) by mouth every morning. 30 tablet 1   cholecalciferol (VITAMIN D3) 25 MCG (1000 UNIT) tablet Take 1,000 Units by mouth daily.     cyanocobalamin 1000 MCG tablet Take 1,000 mcg by mouth daily.     diazepam (VALIUM) 5 MG tablet Take 1 tablet (5 mg total) by mouth every 8 (eight) hours as needed for anxiety. 90 tablet 0   dicyclomine (BENTYL) 10 MG capsule Take by mouth.     furosemide (LASIX) 20 MG tablet Take 20 mg by mouth daily as needed.     lamoTRIgine (LAMICTAL) 200 MG tablet Take 1 tablet (200 mg total) by mouth at bedtime. 30 tablet 1   Multiple Vitamin (MULTIVITAMIN WITH MINERALS) TABS Take 1 tablet by mouth daily.     NURTEC 75 MG TBDP Take 1 tablet by mouth as directed.     pantoprazole (PROTONIX) 40 MG tablet Take 1 tablet (40 mg total) by mouth daily. (Patient taking differently: Take 40 mg by mouth 2 (two) times daily.) 30 tablet 0   promethazine (PHENERGAN) 25 MG tablet Take 1 tablet (25 mg total) by mouth every 6 (six) hours as needed for nausea or vomiting. 30 tablet 0   QUEtiapine (SEROQUEL) 50 MG tablet Take 1 tablet (50 mg total) by mouth at bedtime. 30 tablet 1   sertraline (ZOLOFT) 100 MG tablet Take 2 tablets (200 mg total) by mouth daily. 60 tablet 1   No current facility-administered medications for this visit.    Past Medical History:  Diagnosis Date   Anemia    Anxiety    Asthma    smoker-uses nebulizer-not used in last year, albuterol rescue inhaler used in 11/13 preoperatively   Bipolar 1 disorder (HCC)    BMI 35.0-35.9,adult    Cervical disc disease    Chest pain    Chronic  bronchitis (HCC)    nebulizer and rescue inhaler   Depression    Edema    GERD (gastroesophageal reflux disease)    occasional -tums   Headache(784.0)    Hearing loss in left ear    Hypercholesteremia    Hyperlipidemia    Hypertension    Menopausal symptoms    Missed ab 01/09/2012   10weeks-used cytotec   Numbness and tingling    PONV (postoperative nausea and vomiting)    Preterm labor    Right upper quadrant abdominal pain    SOB (shortness of breath)    Teeth decayed    lower , nothing loose-encouraged pt to  seek dental care if necessary    Past Surgical History:  Procedure Laterality Date   ABDOMINAL HYSTERECTOMY     CHOLECYSTECTOMY     CHOLECYSTECTOMY, LAPAROSCOPIC  12/06/2010   DILATION AND CURETTAGE OF UTERUS  06/11/2012   Procedure: DILATATION AND CURETTAGE;  Surgeon: Geryl Rankins, MD;  Location: [facility];  Service: Gynecology;  Laterality: N/A;  sec procedure start  1241   DILATION AND EVACUATION  01/16/2012   Procedure: DILATATION AND EVACUATION;  Surgeon: Geryl Rankins, MD;  Location: [facility];  Service: Gynecology;  Laterality: N/A;   LAPAROSCOPY  06/11/2012   Procedure: LAPAROSCOPY OPERATIVE;  Surgeon: Geryl Rankins, MD;  Location: [facility];  Service: Gynecology;  Laterality: N/A;   left ear drum rebuilt     x2   TYMPANOPLASTY Left 04/23/2016   Procedure: TYMPANOPLASTY LEFT EAR;  Surgeon: Serena Colonel, MD;  Location: [facility];  Service: ENT;  Laterality: Left;   VAGINAL HYSTERECTOMY N/A 09/01/2012   Procedure: HYSTERECTOMY VAGINAL;  Surgeon: Geryl Rankins, MD;  Location: [facility];  Service: Gynecology;  Laterality: N/A;    Family History  Problem Relation Age of Onset   Cancer Other        family hx   Diabetes Other        family hx   Depression Other        family hx    Coronary artery disease Mother        also MVD   Hyperlipidemia Mother    Hypertension Mother    Hypertension Father    Diabetes Father    Diabetes Maternal  Grandmother    Colon cancer Paternal Grandmother    Breast cancer Maternal Aunt    Anesthesia problems Neg Hx     Allergies as of 11/26/2022 - Review Complete 11/26/2022  Allergen Reaction Noted   Penicillins Hives 01/25/2010    Social History   Socioeconomic History   Marital status: Married    Spouse name: Not on file   Number of children: 2   Years of education: GED   Highest education level: Not on file  Occupational History   Occupation: Housewife  Tobacco Use   Smoking status: Former    Packs/day: 0.25    Years: 8.00    Additional pack years: 0.00    Total pack years: 2.00    Types: Cigarettes   Smokeless tobacco: Never   Tobacco comments:    up to 1/2 ppl x 8 years  Substance and Sexual Activity   Alcohol use: No   Drug use: No   Sexual activity: Yes    Birth control/protection: Surgical  Other Topics Concern   Not on file  Social History Narrative   Lives with daughter, son and husband in a one story home.  Has 2 children.  Does not work at this time.  Education: GED.    Right-handed.   4-5 twelve ounce cans per day.   Social Determinants of Health   Financial Resource Strain: Not on file  Food Insecurity: Not on file  Transportation Needs: Not on file  Physical Activity: Not on file  Stress: Not on file  Social Connections: Not on file  Intimate Partner Violence: Not on file     Review of Systems   Gen: + fatigue. Denies any fever, chills,weight loss, lack of appetite.  CV: Denies chest pain, heart palpitations, peripheral edema, syncope.  Resp: Denies shortness of breath at rest or with exertion. Denies wheezing or cough.  GI:  see HPI GU : Denies urinary burning, urinary frequency, urinary hesitancy MS: Denies joint pain, muscle weakness, cramps, or limitation of movement.  Derm: Denies rash, itching, dry skin Psych: Denies depression, anxiety, memory loss, and confusion Heme: Denies bruising, bleeding, and enlarged lymph nodes.  Physical  Exam   BP 127/83 (BP Location: Right Arm, Patient Position: Sitting, Cuff Size: Normal)   Pulse 93   Temp 97.6 F (36.4 C) (Temporal)   Ht 5\' 7"  (1.702 m)   Wt 242 lb 3.2 oz (109.9 kg)   LMP 08/25/2012   SpO2 97%   BMI 37.93 kg/m   General:   Alert and oriented. Pleasant and cooperative. Well-nourished and well-developed.  Head:  Normocephalic and atraumatic. Eyes:  Without icterus, sclera clear and conjunctiva pink.  Ears:  Normal auditory acuity. Mouth:  No deformity or lesions, oral mucosa pink.  Lungs:  Clear to auscultation bilaterally. No wheezes, rales, or rhonchi. No distress.  Heart:  S1, S2 present without murmurs appreciated.  Abdomen:  +BS, soft, non-distended. Generalized ttp throughout. No HSM noted. No guarding or rebound. No masses appreciated. Rectal:  Deferred  Msk:  Symmetrical without gross deformities. Normal posture. Extremities:  Without edema. Neurologic:  Alert and  oriented x4;  grossly normal neurologically. Skin:  Intact without significant lesions or rashes. Psych:  Alert and cooperative. Normal mood and affect.   Assessment   Monica Ballard is a 47 y.o. female with a history of IBS, cholecystectomy, anemia, anxiety, depression, asthma, GERD, HTN, HLD, chronic nausea presenting today to establish care and further evaluation of nausea/vomiting and IBS.  GERD, nausea, LUQ abdominal pain: EGD in 2019 with gastritis, pathology report unavailable. Reports chronic nausea however worsening over the last year and needing phenergan frequently. Zofran previously not helpful. Has been on pantoprazole once daily for years and recently increased to BID and does not see improvement. Denies NSAIDs or alcohol use. Report some dark stools but denies black.tarry stools. Denies hematochezia. Having upper abdominal pain primary on her left side and radiates to her back but also feels a fullness to her upper abdomen. Pain wakes her up at times. Denies family history of celiac.  At times gas ex and dicyclomine eases off pain. DDD could also be contributing to patients left sided pain. Will check TSH, celiac labs, B12, and iron panel if not already performed by PCP. Differentials include celiac, gastroparesis, poorly controlled GERD, SIBO. Advised to continue probiotic yogurt and we will stop pantoprazole and trial Nexium as she has previously tried omeprazole. Also recommended famotidine nightly. Will request prior pathology records but if no improvement with change in PPI we will consider repeating EGD and possibly obtaining GES to assess for gastroparesis. Could consider SIBO testing or trial of Xifaxin to help with bloating. Refilled phenergan for nausea.   IBS, constipation: Has constipation accompanied by abdominal pain. Usually bristol 1 stools and has been 10 days without a BM in the past. Has tried Linzess in the past but unsure what dose and reported diarrhea with this. At times has urgency and will have multiple bristol 4 stools in a day after a few days without a BM. Advised to continue probiotic yogurt and use dicyclomine sparingly as this can cause constipation. Will trial low dose Linzess and if not effective or having too much diarrhea we will likely trial Amitiza. She is too call with a progress report on Linzess and advised on administration and washout period.   PLAN   Linzess 72 mcg daily.  Samples provided Stop pantoprazole and start Nexium 40 mg BID Famotidine nightly as needed Phenergan 25 mg q6h as needed for nausea. Refilled today Use gas ex as needed.  Continue to use dicyclomine as needed for abdominal pain Continue probiotic yogurt.  GERD diet Request prior colonoscopy report and path reports.  Request labs from [facility] - need TSH, celiac, iron panel, B12, folate May do EGD and possibly colonoscopy pending review of records and labs.  Follow up in 8 weeks.    Brooke Bonito, MSN, FNP-BC, AGACNP-BC [facility]

## 2022-11-26 ENCOUNTER — Encounter: Payer: Self-pay | Admitting: Gastroenterology

## 2022-11-26 ENCOUNTER — Ambulatory Visit (INDEPENDENT_AMBULATORY_CARE_PROVIDER_SITE_OTHER): Payer: No Typology Code available for payment source | Admitting: Gastroenterology

## 2022-11-26 VITALS — BP 127/83 | HR 93 | Temp 97.6°F | Ht 67.0 in | Wt 242.2 lb

## 2022-11-26 DIAGNOSIS — R112 Nausea with vomiting, unspecified: Secondary | ICD-10-CM

## 2022-11-26 DIAGNOSIS — R109 Unspecified abdominal pain: Secondary | ICD-10-CM | POA: Diagnosis not present

## 2022-11-26 DIAGNOSIS — R110 Nausea: Secondary | ICD-10-CM | POA: Diagnosis not present

## 2022-11-26 DIAGNOSIS — K581 Irritable bowel syndrome with constipation: Secondary | ICD-10-CM | POA: Diagnosis not present

## 2022-11-26 DIAGNOSIS — K219 Gastro-esophageal reflux disease without esophagitis: Secondary | ICD-10-CM

## 2022-11-26 MED ORDER — PROMETHAZINE HCL 25 MG PO TABS
25.0000 mg | ORAL_TABLET | Freq: Four times a day (QID) | ORAL | 1 refills | Status: DC | PRN
Start: 2022-11-26 — End: 2022-12-31

## 2022-11-26 MED ORDER — ESOMEPRAZOLE MAGNESIUM 40 MG PO PACK: 40.0000 mg | PACK | Freq: Two times a day (BID) | ORAL | 5 refills | Status: DC

## 2022-11-26 NOTE — Patient Instructions (Addendum)
Please fill out a records request at the front desk to get your most recent labs from [facility] as well as a separate form to request your prior colonoscopy and pathology records from [facility]  I currently has not samples of Linzess 72 mcg today.  Please take this over the next 1-2 weeks.  Be mindful that it is common to have diarrhea after taking this for the first couple weeks.  If it is effective then we will send in prescription and monitor for any ongoing diarrhea.  You may continue eating your probiotic yogurt.  He may continue to use Gas-X as needed for bloating/pain as well as the dicyclomine.  Try to use the dicyclomine sparingly as it commonly can cause constipation.  You will stop taking pantoprazole and start Nexium 40 mg twice daily.  For any breakthrough symptoms you may use famotidine (Pepcid) nightly.   Follow a GERD diet:  Avoid fried, fatty, greasy, spicy, citrus foods. Avoid caffeine and carbonated beverages. Avoid chocolate. Try eating 4-6 small meals a day rather than 3 large meals. Do not eat within 3 hours of laying down. Prop head of bed up on wood or bricks to create a 6 inch incline.  I have refilled your Phenergan for you.  Pending how you do with your Nexium we may proceed with an upper endoscopy in the near future and obtain some additional lab workup after I receive your records from [facility].  We will follow-up in 8 weeks.  Please feel free to reach out via MyChart with a progress report in about 10 to 14 days.  It was a pleasure to see you today. I want to create trusting relationships with patients. If you receive a survey regarding your visit,  I greatly appreciate you taking time to fill this out on paper or through your MyChart. I value your feedback.  Brooke Bonito, MSN, FNP-BC, AGACNP-BC [facility]

## 2022-11-27 ENCOUNTER — Encounter: Payer: Self-pay | Admitting: Gastroenterology

## 2022-12-05 ENCOUNTER — Telehealth: Payer: Self-pay | Admitting: *Deleted

## 2022-12-05 NOTE — Telephone Encounter (Signed)
Pt called and states she has had about 10 loose watery stools today. She is taking Linzess . Please advise. You can send her a MyChart message with recommendations.

## 2022-12-05 NOTE — Telephone Encounter (Signed)
Noted  

## 2022-12-13 ENCOUNTER — Telehealth (HOSPITAL_COMMUNITY)
Payer: No Typology Code available for payment source | Admitting: Student in an Organized Health Care Education/Training Program

## 2022-12-20 ENCOUNTER — Encounter (HOSPITAL_COMMUNITY): Payer: Self-pay | Admitting: Student in an Organized Health Care Education/Training Program

## 2022-12-20 ENCOUNTER — Telehealth (HOSPITAL_BASED_OUTPATIENT_CLINIC_OR_DEPARTMENT_OTHER)
Payer: No Typology Code available for payment source | Admitting: Student in an Organized Health Care Education/Training Program

## 2022-12-20 DIAGNOSIS — F339 Major depressive disorder, recurrent, unspecified: Secondary | ICD-10-CM

## 2022-12-20 DIAGNOSIS — F411 Generalized anxiety disorder: Secondary | ICD-10-CM | POA: Diagnosis not present

## 2022-12-20 DIAGNOSIS — F4001 Agoraphobia with panic disorder: Secondary | ICD-10-CM | POA: Diagnosis not present

## 2022-12-20 MED ORDER — LAMOTRIGINE 200 MG PO TABS
200.0000 mg | ORAL_TABLET | Freq: Every day | ORAL | 1 refills | Status: DC
Start: 2022-12-20 — End: 2023-01-28

## 2022-12-20 MED ORDER — BUPROPION HCL ER (XL) 150 MG PO TB24
150.0000 mg | ORAL_TABLET | ORAL | 1 refills | Status: DC
Start: 2022-12-20 — End: 2023-01-28

## 2022-12-20 MED ORDER — DIAZEPAM 5 MG PO TABS
5.0000 mg | ORAL_TABLET | Freq: Three times a day (TID) | ORAL | 0 refills | Status: DC | PRN
Start: 2022-12-20 — End: 2023-01-28

## 2022-12-20 MED ORDER — QUETIAPINE FUMARATE 150 MG PO TABS
75.00 mg | ORAL_TABLET | Freq: Every day | ORAL | 1 refills | Status: DC
Start: 2022-12-20 — End: 2023-01-28

## 2022-12-20 MED ORDER — SERTRALINE HCL 100 MG PO TABS
200.00 mg | ORAL_TABLET | Freq: Every day | ORAL | 1 refills | Status: DC
Start: 2022-12-20 — End: 2023-01-28

## 2022-12-20 NOTE — Addendum Note (Signed)
Addended by: Kewana Sanon on: 12/20/2022 11:45 AM   Modules accepted: Level of Service  

## 2022-12-20 NOTE — Progress Notes (Signed)
BH MD/PA/NP OP Progress Note  Virtual Visit via Video Note  I connected with Monica Ballard on 12/20/22 at  8:30 AM EDT by a video enabled telemedicine application and verified that I am speaking with the correct person using two identifiers.  Location: Patient: Home Provider: Ninfa Meeker   I discussed the limitations of evaluation and management by telemedicine and the availability of in person appointments. The patient expressed understanding and agreed to proceed.  12/20/2022 8:48 AM Heron Nay  MRN:  [id number]  Chief Complaint:  Chief Complaint  Patient presents with   Treatment Resisant Depression   HPI:  Monica Ballard. [name] is a 47 yr old female who presents via Virtual Video Visit for Follow Up and Medication Management.  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   Due to technical issues on her end she was unable to have video and so this interview was audio only.  She reports she has noticed some slight improvement since starting the Seroquel.  She reports some improvement in her depression though her mood continues to be very low.  She reports there has been some very slight improvement in her anxiety.  She reports she is able to get out of the house for very brief periods of time which she was unable to do prior.  He reports she continues to have a cough and is unsure if this is due to the Seroquel or not.  She reports that she started the Seroquel after her sinus/ear infection and that the pollen count has been very high.  She reports she is not sure if the Seroquel that caused her to still have a cough but she did want to mention it.  Discussed with her most likely it was due to the pollen and her recent infections but that if it gets worse we we would make changes.  Discussed with her that we would not make any other changes to her medications at this time.  She reports  no SI, HI, or AVH.  She reports her sleep has improved.  She reports her appetite has improved.  She reports having headaches and occasional dizziness.  She reports continuing to have nausea but that her GI doctor is working this up and she might possibly need an endoscopy.  He reports no other concerns at present.  Discussed with patient that Resident Provider would be transitioning their care to another Resident Provider, Dr. Alfonse Flavors, starting July 2024.  She reported understanding and had no concerns.  Visit Diagnosis:    ICD-10-CM   1. Panic disorder with agoraphobia  F40.01 QUEtiapine 150 MG TABS    diazepam (VALIUM) 5 MG tablet    2. 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   Past Medical History:  Past Medical History:  Diagnosis Date   Anemia    Anxiety  Asthma    smoker-uses nebulizer-not used in last year, albuterol rescue inhaler used in 11/13 preoperatively   Bipolar 1 disorder (HCC)    BMI 35.0-35.9,adult    Cervical disc disease    Chest pain    Chronic bronchitis (HCC)    nebulizer and rescue inhaler   Depression    Edema    GERD (gastroesophageal reflux disease)    occasional -tums   Headache(784.0)    Hearing loss in left ear    Hypercholesteremia    Hyperlipidemia    Hypertension    Menopausal symptoms    Missed ab 01/09/2012   10weeks-used cytotec   Numbness and tingling    PONV (postoperative nausea and vomiting)     Preterm labor    Right upper quadrant abdominal pain    SOB (shortness of breath)    Teeth decayed    lower , nothing loose-encouraged pt to seek dental care if necessary    Past Surgical History:  Procedure Laterality Date   ABDOMINAL HYSTERECTOMY     CHOLECYSTECTOMY     CHOLECYSTECTOMY, LAPAROSCOPIC  12/06/2010   DILATION AND CURETTAGE OF UTERUS  06/11/2012   Procedure: DILATATION AND CURETTAGE;  Surgeon: Geryl Rankins, MD;  Location: [facility];  Service: Gynecology;  Laterality: N/A;  sec procedure start  1241   DILATION AND EVACUATION  01/16/2012   Procedure: DILATATION AND EVACUATION;  Surgeon: Geryl Rankins, MD;  Location: [facility];  Service: Gynecology;  Laterality: N/A;   LAPAROSCOPY  06/11/2012   Procedure: LAPAROSCOPY OPERATIVE;  Surgeon: Geryl Rankins, MD;  Location: [facility];  Service: Gynecology;  Laterality: N/A;   left ear drum rebuilt     x2   TYMPANOPLASTY Left 04/23/2016   Procedure: TYMPANOPLASTY LEFT EAR;  Surgeon: Serena Colonel, MD;  Location: [facility];  Service: ENT;  Laterality: Left;   VAGINAL HYSTERECTOMY N/A 09/01/2012   Procedure: HYSTERECTOMY VAGINAL;  Surgeon: Geryl Rankins, MD;  Location: [facility];  Service: Gynecology;  Laterality: N/A;    Family Psychiatric History: Half brother- Bipolar disorder, ADHD.   Family History:  Family History  Problem Relation Age of Onset   Cancer Other        family hx   Diabetes Other        family hx   Depression Other        family hx    Coronary artery disease Mother        also MVD   Hyperlipidemia Mother    Hypertension Mother    Hypertension Father    Diabetes Father    Diabetes Maternal Grandmother    Colon cancer Paternal Grandmother    Breast cancer Maternal Aunt    Anesthesia problems Neg Hx     Social History:  Social History   Socioeconomic History   Marital status: Married    Spouse name: Not on file   Number of children: 2   Years of education: GED   Highest education level:  Not on file  Occupational History   Occupation: Housewife  Tobacco Use   Smoking status: Former    Packs/day: 0.25    Years: 8.00    Additional pack years: 0.00    Total pack years: 2.00    Types: Cigarettes   Smokeless tobacco: Never   Tobacco comments:    up to 1/2 ppl x 8 years  Vaping Use   Vaping Use: Never used  Substance and Sexual Activity   Alcohol use: No   Drug  use: No   Sexual activity: Yes    Birth control/protection: Surgical  Other Topics Concern   Not on file  Social History Narrative   Lives with daughter, son and husband in a one story home.  Has 2 children.  Does not work at this time.  Education: GED.    Right-handed.   4-5 twelve ounce cans per day.   Social Determinants of Health   Financial Resource Strain: Not on file  Food Insecurity: Not on file  Transportation Needs: Not on file  Physical Activity: Not on file  Stress: Not on file  Social Connections: Not on file    Allergies:  Allergies  Allergen Reactions   Penicillins Hives    Metabolic Disorder Labs: No results found for: "HGBA1C", "MPG" No results found for: "PROLACTIN" No results found for: "CHOL", "TRIG", "HDL", "CHOLHDL", "VLDL", "LDLCALC" Lab Results  Component Value Date   TSH 3.270 12/22/2021    Therapeutic Level Labs: No results found for: "LITHIUM" No results found for: "VALPROATE" No results found for: "CBMZ"  Current Medications: Current Outpatient Medications  Medication Sig Dispense Refill   atorvastatin (LIPITOR) 20 MG tablet Take 20 mg by mouth daily.     budesonide-formoterol (SYMBICORT) 160-4.5 MCG/ACT inhaler Inhale 2 puffs into the lungs 2 (two) times daily.     buPROPion (WELLBUTRIN XL) 150 MG 24 hr tablet Take 1 tablet (150 mg total) by mouth every morning. 30 tablet 1   cholecalciferol (VITAMIN D3) 25 MCG (1000 UNIT) tablet Take 1,000 Units by mouth daily.     cyanocobalamin 1000 MCG tablet Take 1,000 mcg by mouth daily.     diazepam (VALIUM) 5 MG  tablet Take 1 tablet (5 mg total) by mouth every 8 (eight) hours as needed for anxiety. 90 tablet 0   dicyclomine (BENTYL) 10 MG capsule Take by mouth.     esomeprazole (NEXIUM) 40 MG packet Take 40 mg by mouth in the morning and at bedtime. 60 each 5   furosemide (LASIX) 20 MG tablet Take 20 mg by mouth daily as needed.     lamoTRIgine (LAMICTAL) 200 MG tablet Take 1 tablet (200 mg total) by mouth at bedtime. 30 tablet 1   Multiple Vitamin (MULTIVITAMIN WITH MINERALS) TABS Take 1 tablet by mouth daily.     NURTEC 75 MG TBDP Take 1 tablet by mouth as directed.     promethazine (PHENERGAN) 25 MG tablet Take 1 tablet (25 mg total) by mouth every 6 (six) hours as needed for nausea or vomiting. 80 tablet 1   QUEtiapine 150 MG TABS Take 75 mg by mouth at bedtime. 15 tablet 1   sertraline (ZOLOFT) 100 MG tablet Take 2 tablets (200 mg total) by mouth daily. 60 tablet 1   No current facility-administered medications for this visit.     Musculoskeletal: Strength & Muscle Tone: within normal limits Gait & Station:  laying in bed Patient leans: N/A  Psychiatric Specialty Exam: Review of Systems  Respiratory:  Negative for shortness of breath.   Cardiovascular:  Negative for chest pain.  Gastrointestinal:  Positive for nausea. Negative for abdominal pain, constipation, diarrhea and vomiting.  Neurological:  Positive for dizziness (occasional) and headaches. Negative for weakness.  Psychiatric/Behavioral:  Positive for dysphoric mood and sleep disturbance (improving). Negative for hallucinations and suicidal ideas. The patient is nervous/anxious.     Last menstrual period 08/25/2012.There is no height or weight on file to calculate BMI.  General Appearance: Unable to assess due to camera issues  Eye Contact:  Unable to assess due to camera issues  Speech:  Clear and Coherent and Normal Rate  Volume:  Normal  Mood:   "not so good"  Affect:  Congruent  Thought Process:  Coherent and Goal Directed   Orientation:  Full (Time, Place, and Person)  Thought Content: WDL and Logical   Suicidal Thoughts:  No  Homicidal Thoughts:  No  Memory:  Immediate;   Good Recent;   Good  Judgement:  Good  Insight:  Good  Psychomotor Activity:  Unable to assess due to camera issues  Concentration:  Concentration: Good and Attention Span: Good  Recall:  Good  Fund of Knowledge: Good  Language: Good  Akathisia:  Unable to assess due to camera issues  Handed:  Right  AIMS (if indicated): not done  Assets:  Communication Skills Desire for Improvement Financial Resources/Insurance Housing Resilience Social Support  ADL's:  Intact  Cognition: WNL  Sleep:   improved   Screenings: Oceanographer Row Counselor from 04/04/2021 in [facility] Video Visit from 09/26/2020 in [facility]  PHQ-2 Total Score 4 5  PHQ-9 Total Score 17 13      Flowsheet Row ED from 04/19/2022 in [facility] at [facility] Counselor from 04/04/2021 in [facility] Video Visit from 09/26/2020 in [facility]  C-SSRS RISK CATEGORY No Risk No Risk No Risk        Assessment and Plan:  Felicity Coyer. Gessel is a 47 yr old female who presents via Virtual Video Visit for Follow Up and Medication Management.  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    Monica Ballard has had some improvement with starting Seroquel.  She does report continuing to have a cough but given her recent sinus infection and high pollen count could be the cause of this.  Since she did have a positive response we will trial a further increase in the Seroquel.  We will not make any other changes to her medication at this time.  She will return for follow-up in  approximately 5 to 6 weeks.   Treatment Resistant Depression and Anxiety -Continue Zoloft 200 mg daily for depression and anxiety.  60 (100 mg) tablets with 1 refill.  -Continue Wellbutrin XL 150 mg daily for depression and anxiety.  30 tablets with 1 refill.  -Continue Lamictal 200 mg daily for mood stability.  30 tablets with 1 refill.  -Continue Valium 5 mgr TID PRN for anxiety.  No refills sent at this time.  -Increase Seroquel to 75 mg QHS for augmentation.  15 (150 mg) tablets with 1 refill.       Previous Med Trials:  fluoxetine, sertraline, paroxetine, trazodone, citalopram, bupropion, venlafaxine, duloxetine, Abilify, Zyprexa    Collaboration of Care:   Patient/Guardian was advised Release of Information must be obtained prior to any record release in order to collaborate their care with an outside provider. Patient/Guardian was advised if they have not already done so to contact the registration department to sign all necessary forms in order for [place] to release information regarding their care.   Consent: Patient/Guardian gives verbal consent for treatment and assignment of benefits for services provided during this visit. Patient/Guardian expressed understanding and agreed to proceed.    Lauro Franklin, MD 12/20/2022, 8:48 AM  Follow Up Instructions:    I discussed the assessment and treatment plan with the patient. The patient was provided an opportunity to ask questions and all were answered. The patient agreed with the plan and demonstrated an understanding of the instructions.   The patient was advised to call back or seek an in-person evaluation if the symptoms worsen or if the condition fails to improve as anticipated.  I provided 16 minutes of non-face-to-face time during this encounter.   Lauro Franklin, MD

## 2022-12-23 ENCOUNTER — Other Ambulatory Visit (HOSPITAL_COMMUNITY): Payer: Self-pay | Admitting: Psychiatry

## 2022-12-23 DIAGNOSIS — F4001 Agoraphobia with panic disorder: Secondary | ICD-10-CM

## 2022-12-29 ENCOUNTER — Other Ambulatory Visit: Payer: Self-pay | Admitting: Gastroenterology

## 2022-12-29 DIAGNOSIS — R110 Nausea: Secondary | ICD-10-CM

## 2023-01-21 ENCOUNTER — Encounter: Payer: Self-pay | Admitting: Gastroenterology

## 2023-01-21 ENCOUNTER — Ambulatory Visit (INDEPENDENT_AMBULATORY_CARE_PROVIDER_SITE_OTHER): Payer: No Typology Code available for payment source | Admitting: Gastroenterology

## 2023-01-21 VITALS — BP 138/84 | HR 94 | Temp 97.7°F | Ht 69.0 in | Wt 250.2 lb

## 2023-01-21 DIAGNOSIS — K219 Gastro-esophageal reflux disease without esophagitis: Secondary | ICD-10-CM | POA: Diagnosis not present

## 2023-01-21 DIAGNOSIS — R110 Nausea: Secondary | ICD-10-CM | POA: Diagnosis not present

## 2023-01-21 DIAGNOSIS — R140 Abdominal distension (gaseous): Secondary | ICD-10-CM

## 2023-01-21 DIAGNOSIS — K581 Irritable bowel syndrome with constipation: Secondary | ICD-10-CM

## 2023-01-21 DIAGNOSIS — R109 Unspecified abdominal pain: Secondary | ICD-10-CM

## 2023-01-21 MED ORDER — PROMETHAZINE HCL 25 MG PO TABS
ORAL_TABLET | ORAL | 1 refills | Status: DC
Start: 2023-01-21 — End: 2023-03-11

## 2023-01-21 MED ORDER — FAMOTIDINE 20 MG PO TABS: 20.0000 mg | ORAL_TABLET | Freq: Every day | ORAL | 1 refills | Status: DC

## 2023-01-21 NOTE — Progress Notes (Signed)
GI Office Note    Referring Provider: Donetta Potts, MD Primary Care Physician:  Donetta Potts, MD Primary Gastroenterologist: Gerrit Friends.Rourk, MD   Date:  01/21/2023  ID:  Monica Ballard, DOB Nov 08, 1975, MRN [id number]   Chief Complaint   Chief Complaint  Patient presents with   Follow-up    Pt states everything is the same. BM's are firmer with more mucus. The IBS flips flops   History of Present Illness  Monica Ballard is a 46 y.o. female with a history of IBS, anemia, anxiety/depression, asthma, GERD, HTN, HLD, chronic nausea and cholecystectomy presenting today for follow up with not much change in symptoms.   Colonoscopy in 2003 by Dr. Arlyce Dice: -Completely normal colonoscopy   EGD with [facility] July 2019: -Normal esophagus -Normal duodenum -Gastritis s/p biopsy -Hiatal hernia 2 cm -Advised PPI daily -Pathology report not provided.  Advised to perform colonoscopy in the near future.  Patient seen by [facility] gastroenterology in January 2020 with report of lower and generalized crampy abdominal pain usually relieved by bowel movement. Well-controlled with Bentyl. Does not constipation and gets bloated with MiraLAX twice daily. History of diaphragmatic hernia in 2019. History of reflux with primarily nocturnal symptoms despite Protonix. Noted family history of colon cancer in her paternal great grandmother and paternal grandmother. Also with complaints of nausea. She was advised to take mag citrate for 1 day and then start MiraLAX 17 g at bedtime for 30 days. Dicyclomine refilled. Advised to continue pantoprazole. Given Phenergan to use as needed for nausea. Also advised famotidine 20 mg at bedtime. GERD diet discussed.  Office visit 11/26/22.  Still with chronic nausea also with tenderness on the left rib cage that feels numb at times.  Also with some right upper quadrant cramping at times.  Has to apply heat once medication kicks in and she burps and passes gas and is  still some pain usually occurs after eating and then at times feels like she has to go but is unable to go.  Was having Bristol 1 stools and not going for up to 10 days at a time and is in her stool.  Has been trying a probiotic yogurt on a daily basis.  No relief with MiraLAX in the past or with Benefiber or Metamucil.  I been on pantoprazole 40 mg once daily for years and recently increased to twice daily and felt like it was possibly working but wakes up with regurgitation.  Taking Phenergan regularly.  Denies any NSAID use. Linzess provided. Stop pantoprazole and start nexium. Famotidine nightly as neded. Phenergan refilled to use as needed. Gas ex as needed. Dicyclomine as needed for abdominal pain. Probiotic yogurt. Request prior colonoscopy records and labs.   Linzess caused significant diarrhea even at low dose so advised to stop and then start fiber supplementation.   Labs April 2024: WBC 11, hemoglobin 14, alk phos 124, CMP otherwise unremarkable.  Lipid panel with LDL.  TSH normal.  B12 and folate normal.   Today:  Going a little bit more recently with taking 1 metamucil tablet nightly for the last week. Linzess was still too much. Stools are still sometimes small and hard balls. Still having some periumbilical pain and mostly lower abdominal pain. Still feeling bloated in the RUQ and she feels like it is always there but gets worse at time (has feeling of something feeling folded in). Sometimes has to move and push on her RUQ to feel a little bit of relief.  Still having a lot of gas but feels like it is increasing recently. Passing gas from below much more than burping but does both regularly.   No prior celiac testing.  Sometimes nausea is worse than others but it is about the same as well. Sometimes has some regurgitation of acid and gagging but no vomiting.   Has been taking phenergan regularly since her last visit. Has been tacking it at least 2-3 times per day. Thinks she has had  a little bit of improvement with the nexium.  Every now and then she will take the dicyclomine for the abdominal cramping.    Current Outpatient Medications  Medication Sig Dispense Refill   atorvastatin (LIPITOR) 20 MG tablet Take 20 mg by mouth daily.     budesonide-formoterol (SYMBICORT) 160-4.5 MCG/ACT inhaler Inhale 2 puffs into the lungs 2 (two) times daily.     buPROPion (WELLBUTRIN XL) 150 MG 24 hr tablet Take 1 tablet (150 mg total) by mouth every morning. 30 tablet 1   cholecalciferol (VITAMIN D3) 25 MCG (1000 UNIT) tablet Take 1,000 Units by mouth daily.     diazepam (VALIUM) 5 MG tablet Take 1 tablet (5 mg total) by mouth every 8 (eight) hours as needed for anxiety. 90 tablet 0   dicyclomine (BENTYL) 10 MG capsule Take by mouth.     esomeprazole (NEXIUM) 40 MG packet Take 40 mg by mouth in the morning and at bedtime. 60 each 5   furosemide (LASIX) 20 MG tablet Take 20 mg by mouth daily as needed.     lamoTRIgine (LAMICTAL) 200 MG tablet Take 1 tablet (200 mg total) by mouth at bedtime. 30 tablet 1   Multiple Vitamin (MULTIVITAMIN WITH MINERALS) TABS Take 1 tablet by mouth daily.     promethazine (PHENERGAN) 25 MG tablet TAKE ONE TABLET BY MOUTH EVERY 6 HOURS AS NEEDED FOR NAUSEA AND VOMITING 100 tablet 0   QUEtiapine 150 MG TABS Take 75 mg by mouth at bedtime. 15 tablet 1   sertraline (ZOLOFT) 100 MG tablet Take 2 tablets (200 mg total) by mouth daily. 60 tablet 1   cyanocobalamin 1000 MCG tablet Take 1,000 mcg by mouth daily. (Patient not taking: Reported on 01/21/2023)     NURTEC 75 MG TBDP Take 1 tablet by mouth as directed. (Patient not taking: Reported on 01/21/2023)     No current facility-administered medications for this visit.    Past Medical History:  Diagnosis Date   Anemia    Anxiety    Asthma    smoker-uses nebulizer-not used in last year, albuterol rescue inhaler used in 11/13 preoperatively   Bipolar 1 disorder (HCC)    BMI 35.0-35.9,adult    Cervical disc  disease    Chest pain    Chronic bronchitis (HCC)    nebulizer and rescue inhaler   Depression    Edema    GERD (gastroesophageal reflux disease)    occasional -tums   Headache(784.0)    Hearing loss in left ear    Hypercholesteremia    Hyperlipidemia    Hypertension    Menopausal symptoms    Missed ab 01/09/2012   10weeks-used cytotec   Numbness and tingling    PONV (postoperative nausea and vomiting)    Preterm labor    Right upper quadrant abdominal pain    SOB (shortness of breath)    Teeth decayed    lower , nothing loose-encouraged pt to seek dental care if necessary    Past Surgical History:  Procedure Laterality Date   ABDOMINAL HYSTERECTOMY     CHOLECYSTECTOMY     CHOLECYSTECTOMY, LAPAROSCOPIC  12/06/2010   DILATION AND CURETTAGE OF UTERUS  06/11/2012   Procedure: DILATATION AND CURETTAGE;  Surgeon: Geryl Rankins, MD;  Location: [facility];  Service: Gynecology;  Laterality: N/A;  sec procedure start  1241   DILATION AND EVACUATION  01/16/2012   Procedure: DILATATION AND EVACUATION;  Surgeon: Geryl Rankins, MD;  Location: [facility];  Service: Gynecology;  Laterality: N/A;   LAPAROSCOPY  06/11/2012   Procedure: LAPAROSCOPY OPERATIVE;  Surgeon: Geryl Rankins, MD;  Location: [facility];  Service: Gynecology;  Laterality: N/A;   left ear drum rebuilt     x2   TYMPANOPLASTY Left 04/23/2016   Procedure: TYMPANOPLASTY LEFT EAR;  Surgeon: Serena Colonel, MD;  Location: [facility];  Service: ENT;  Laterality: Left;   VAGINAL HYSTERECTOMY N/A 09/01/2012   Procedure: HYSTERECTOMY VAGINAL;  Surgeon: Geryl Rankins, MD;  Location: [facility];  Service: Gynecology;  Laterality: N/A;    Family History  Problem Relation Age of Onset   Cancer Other        family hx   Diabetes Other        family hx   Depression Other        family hx    Coronary artery disease Mother        also MVD   Hyperlipidemia Mother    Hypertension Mother    Hypertension Father    Diabetes  Father    Diabetes Maternal Grandmother    Colon cancer Paternal Grandmother    Breast cancer Maternal Aunt    Anesthesia problems Neg Hx     Allergies as of 01/21/2023 - Review Complete 01/21/2023  Allergen Reaction Noted   Penicillins Hives 01/25/2010    Social History   Socioeconomic History   Marital status: Married    Spouse name: Not on file   Number of children: 2   Years of education: GED   Highest education level: Not on file  Occupational History   Occupation: Housewife  Tobacco Use   Smoking status: Former    Packs/day: 0.25    Years: 8.00    Additional pack years: 0.00    Total pack years: 2.00    Types: Cigarettes   Smokeless tobacco: Never   Tobacco comments:    up to 1/2 ppl x 8 years  Vaping Use   Vaping Use: Never used  Substance and Sexual Activity   Alcohol use: No   Drug use: No   Sexual activity: Yes    Birth control/protection: Surgical  Other Topics Concern   Not on file  Social History Narrative   Lives with daughter, son and husband in a one story home.  Has 2 children.  Does not work at this time.  Education: GED.    Right-handed.   4-5 twelve ounce cans per day.   Social Determinants of Health   Financial Resource Strain: Not on file  Food Insecurity: Not on file  Transportation Needs: Not on file  Physical Activity: Not on file  Stress: Not on file  Social Connections: Not on file     Review of Systems   Gen: Denies fever, chills, anorexia. Denies fatigue, weakness, weight loss.  CV: Denies chest pain, palpitations, syncope, peripheral edema, and claudication. Resp: Denies dyspnea at rest, cough, wheezing, coughing up blood, and pleurisy. GI: See HPI Derm: Denies rash, itching, dry skin Psych: Denies depression, anxiety, memory loss,  confusion. No homicidal or suicidal ideation.  Heme: Denies bruising, bleeding, and enlarged lymph nodes.  Physical Exam   BP 138/84   Pulse 94   Temp 97.7 F (36.5 C)   Ht 5\' 9"  (1.753  m)   Wt 250 lb 3.2 oz (113.5 kg)   LMP 08/25/2012   BMI 36.95 kg/m   General:   Alert and oriented. No distress noted. Pleasant and cooperative.  Head:  Normocephalic and atraumatic. Eyes:  Conjuctiva clear without scleral icterus. Mouth:  Oral mucosa pink and moist. Good dentition. No lesions. Lungs:  Clear to auscultation bilaterally. No wheezes, rales, or rhonchi. No distress.  Heart:  S1, S2 present without murmurs appreciated.  Abdomen:  +BS, soft, non-distended. TTP to LUQ. No rebound or guarding. No HSM or masses noted. Rectal: deferred Msk:  Symmetrical without gross deformities. Normal posture. Extremities:  Without edema. Neurologic:  Alert and  oriented x4 Psych:  Alert and cooperative. Normal mood and affect.  Assessment  Monica Ballard is a 47 y.o. female with a history of IBS, anemia, anxiety/depression, asthma, GERD, HTN, HLD, chronic nausea and cholecystectomy presenting today for follow up of chronic GI issues  GERD, Nausea, LUQ pain: Continues to have daily nausea with need for daily phenergan. Has noticed some mild improvement in GERD and nausea with change in PPI to nexium. Takes dicyclomine sparingly for cramping. Discussed possible need for EGD if current testing negative and she continues to experience symptoms. Gastroparesis unlikely but remains within differential. Could consider GES in the fuutre.   IBS Constipation, Bloating: Failed Linzess due to excessive diarrhea. Has been taking 1 metamucil tablet nightly for last week and having good result in bowel function but continues to have significant bloating in the RUQ and frequent flatulence. Discussed possible dietary factors and advised lactaid as well as testing for sucrase deficiency to identify potential cause of bloating. Will also check celiac labs and alpha gal as well per patient request. Will request prior TCS records again to see if repeat TCS needed.   PLAN   Lactaid supplement before dairy products.   Avoid lactose products Sucraid test for bloating.  Celiac labs and alpha gal testing.  Continue nexium 40 mg BID Famotidine nightly as needed Continue dicyclomine as needed for pain.  Continue metamucil and increase slowly.  Add stool softener daily (colace/docusate sodium) Phenergan every 6 hours as needed, refilled today.  May consider repeat EGD and/or GES in the future Try to get colonoscopy report from [facility]. If no recent colonoscopy will need to repeat. Follow up in 3 months     Brooke Bonito, MSN, FNP-BC, AGACNP-BC [facility]

## 2023-01-21 NOTE — Patient Instructions (Addendum)
Please have blood work completed at [facility].  We will call you with results once they have been received. Please allow 3-5 business days for review. 2 locations for [facility] in [place]:              1. [address], [place]              2. [address] [place], [place]   Lactaid supplement before dairy products. Avoid lactose products as much as possible.   Sucraid test for bloating. Please follow directions on the box.   Continue nexium 40 mg BID.  Famotidine nightly as needed.  Continue dicyclomine as needed for pain.   Continue metamucil and increase slowly.  Add stool softener daily (colace/docusate sodium) for your constipation.   Phenergan every 6 hours as needed, refilled today.   We may need to consider an upper endoscopy for further evaluation of your nausea or consider gastric emptying study.  It was a pleasure to see you today. I want to create trusting relationships with patients. If you receive a survey regarding your visit,  I greatly appreciate you taking time to fill this out on paper or through your MyChart. I value your feedback.  Brooke Bonito, MSN, FNP-BC, AGACNP-BC [facility]

## 2023-01-22 LAB — GLIA (IGA/G) + TTG IGA
Gliadin IgG: 4 units (ref 0–19)
Transglutaminase IgA: <2 U/mL (ref 0–3)

## 2023-01-25 LAB — ALPHA-GAL PANEL
Allergen Lamb IgE: <0.1 kU/L
Beef IgE: <0.1 kU/L
IgE (Immunoglobulin E), Serum: 10 [IU]/mL (ref 6–495)
O215-IgE Alpha-Gal: <0.1 kU/L
Pork IgE: <0.1 kU/L

## 2023-01-25 LAB — IGA: IgA/Immunoglobulin A, Serum: 268 mg/dL (ref 87–352)

## 2023-01-25 LAB — GLIA (IGA/G) + TTG IGA: Antigliadin Abs, IgA: 5 U (ref 0–19)

## 2023-01-28 ENCOUNTER — Encounter: Payer: Self-pay | Admitting: *Deleted

## 2023-01-28 ENCOUNTER — Ambulatory Visit (HOSPITAL_BASED_OUTPATIENT_CLINIC_OR_DEPARTMENT_OTHER): Payer: No Typology Code available for payment source | Admitting: Student

## 2023-01-28 VITALS — BP 121/81 | HR 87 | Ht 67.0 in | Wt 244.0 lb

## 2023-01-28 DIAGNOSIS — F411 Generalized anxiety disorder: Secondary | ICD-10-CM | POA: Diagnosis not present

## 2023-01-28 DIAGNOSIS — F4001 Agoraphobia with panic disorder: Secondary | ICD-10-CM

## 2023-01-28 DIAGNOSIS — F339 Major depressive disorder, recurrent, unspecified: Secondary | ICD-10-CM | POA: Diagnosis not present

## 2023-01-28 MED ORDER — QUETIAPINE FUMARATE 25 MG PO TABS: 50.0000 mg | ORAL_TABLET | Freq: Every day | ORAL | 1 refills | Status: DC

## 2023-01-28 MED ORDER — DIAZEPAM 2 MG PO TABS: 2.0000 mg | ORAL_TABLET | Freq: Every day | ORAL | 0 refills | Status: AC | PRN

## 2023-01-28 MED ORDER — SERTRALINE HCL 100 MG PO TABS
200.0000 mg | ORAL_TABLET | Freq: Every day | ORAL | 1 refills | Status: AC
Start: 2023-01-28 — End: 2023-07-11

## 2023-01-28 MED ORDER — LAMOTRIGINE 200 MG PO TABS
200.00 mg | ORAL_TABLET | Freq: Every day | ORAL | 1 refills | Status: AC
Start: 2023-01-28 — End: ?

## 2023-01-28 MED ORDER — DIAZEPAM 5 MG PO TABS
5.00 mg | ORAL_TABLET | Freq: Two times a day (BID) | ORAL | 0 refills | Status: AC | PRN
Start: 2023-01-28 — End: 2023-02-27

## 2023-01-28 MED ORDER — BUPROPION HCL ER (XL) 150 MG PO TB24
150.0000 mg | ORAL_TABLET | ORAL | 1 refills | Status: AC
Start: 2023-01-28 — End: 2023-07-11

## 2023-01-28 MED ORDER — PROPRANOLOL HCL 10 MG PO TABS: 10.0000 mg | ORAL_TABLET | Freq: Two times a day (BID) | ORAL | 0 refills | Status: DC

## 2023-01-28 NOTE — Progress Notes (Unsigned)
BH MD Outpatient Progress Note  01/28/2023 3:04 PM Monica Ballard  MRN:  [id number]  Assessment:  Monica Ballard presents for follow-up evaluation in-person. Today, 01/28/23, patient reports that her anxiety and depressive symptoms are unchanged from previous visit, at which time she noted improvement in anxiety with initiation of Seroquel.  Today, patient reports that since starting Seroquel, she has noted weight gain, swelling in bilateral lower extremities, and dizziness in the mornings.  Identifying Information: Monica Ballard is a 47 y.o. female with a history of panic disorder with agoraphobia, GAD, and treatment resistant MDD who is an established patient with [facility] for management of her anxiety and treatment resistant depression.   Risk Assessment: An assessment of suicide and violence risk factors was performed as part of this evaluation and is not significantly changed from the last visit.             While future psychiatric events cannot be accurately predicted, the patient does not currently require acute inpatient psychiatric care and does not currently meet [facility] involuntary commitment criteria.          Plan:  # Treatment resistant depression Past medication trials: See below Status of problem: Severe, unmanaged Interventions: -- Continue Wellbutrin 150 mg daily for depression -- Continue Lamictal 200 mg daily for mood stabilization -- Sertraline 200 mg daily  --Hurt topic.   # Panic disorder with agoraphobia Past medication trials:  Status of problem: Severe, unmanaged Interventions: -- -- Decrease to Valium 5 mg BID and 2 mg every morning PRN.  Although prescribed as as needed, advised patient to take scheduled, as she has been taking previous dosage scheduled.  -- START propranolol 10 mg twice daily for anxiety --  Decrease to Seroquel 50 mg qHS; as this dosage helped with anxiety, will continue but decrease dosage due to adverse  effects.  # Symptoms c/w CHF Past medication trials:  Status of problem: inadequately managed Interventions: -- Advised to follow-up with PCP.  Will reach out to PCP and recommend cardiology follow-up.  Return to care in 4 weeks  Patient was given contact information for behavioral health clinic and was instructed to call 911 for emergencies.    Patient and plan of care will be discussed with the Attending MD ,Dr. Mercy Riding, who agrees with the above statement and plan.   Subjective:  Chief Complaint: No chief complaint on file.   Interval History: Had a fall w6/6, felt more dizzy, noticed blood pressure changes, and weight gain. Wants to address depression and anxiety. No changes but has not worsened. Seroquel a little better with helping her to get out of home, 2-3 times per week.     Valium TID, needing each time. Around the anniversary of sister's death. Does not work at this dosage.   Gried counseling. Things brought up to to surface. Deaths and anger about.   Sleep: Not sleeping well, feels groggy. Feels beter with husband.  Appetite: increased at night, yogurt, snacks. One meal daily Energy/<otivation: Low. Hysterectomy in 2013, unsure of menopause Anhedonia:  Last time enjoyed spomething, no longer enjoying experience.  SI: Sometimes questions life AH/VH: No  Joy: Being with family, when everyone gets along.   Can spend    Racing thoughts. With burst of energy multiple tasks, chores. Few hours only. Weight gain since 3 years 150 lbs max. Mood behan to decline, able to function. When sister passed three years ago, declined.   Business, [employer], no longer  with interest.   Visit Diagnosis:    ICD-10-CM   1. Recurrent major depression resistant to treatment (HCC)  F33.9 buPROPion (WELLBUTRIN XL) 150 MG 24 hr tablet    lamoTRIgine (LAMICTAL) 200 MG tablet    sertraline (ZOLOFT) 100 MG tablet    2. GAD (generalized anxiety disorder)  F41.1 buPROPion (WELLBUTRIN XL)  150 MG 24 hr tablet    lamoTRIgine (LAMICTAL) 200 MG tablet    sertraline (ZOLOFT) 100 MG tablet    3. Panic disorder with agoraphobia  F40.01 diazepam (VALIUM) 5 MG tablet      Past Psychiatric History:  Diagnoses: Treatment Resistant Depression and Anxiety, and no history of Suicide Attempts, Self Injurious Behavior, or Psychiatric Hospitalizations  Medication trials: fluoxetine, sertraline, paroxetine, trazodone, citalopram, bupropion, venlafaxine, duloxetine, Abilify, Zyprexa, Lamictal, Seroquel, Wellbutrin Previous psychiatrist/therapist: *** Hospitalizations: *** Suicide attempts: *** SIB: *** Hx of violence towards others: *** Current access to guns: *** Hx of trauma/abuse: *** Substance use: ***  Past Medical History:  Past Medical History:  Diagnosis Date   Anemia    Anxiety    Asthma    smoker-uses nebulizer-not used in last year, albuterol rescue inhaler used in 11/13 preoperatively   Bipolar 1 disorder (HCC)    BMI 35.0-35.9,adult    Cervical disc disease    Chest pain    Chronic bronchitis (HCC)    nebulizer and rescue inhaler   Depression    Edema    GERD (gastroesophageal reflux disease)    occasional -tums   Headache(784.0)    Hearing loss in left ear    Hypercholesteremia    Hyperlipidemia    Hypertension    Menopausal symptoms    Missed ab 01/09/2012   10weeks-used cytotec   Numbness and tingling    PONV (postoperative nausea and vomiting)    Preterm labor    Right upper quadrant abdominal pain    SOB (shortness of breath)    Teeth decayed    lower , nothing loose-encouraged pt to seek dental care if necessary    Past Surgical History:  Procedure Laterality Date   ABDOMINAL HYSTERECTOMY     CHOLECYSTECTOMY     CHOLECYSTECTOMY, LAPAROSCOPIC  12/06/2010   DILATION AND CURETTAGE OF UTERUS  06/11/2012   Procedure: DILATATION AND CURETTAGE;  Surgeon: Geryl Rankins, MD;  Location: [facility];  Service: Gynecology;  Laterality: N/A;  sec procedure  start  1241   DILATION AND EVACUATION  01/16/2012   Procedure: DILATATION AND EVACUATION;  Surgeon: Geryl Rankins, MD;  Location: [facility];  Service: Gynecology;  Laterality: N/A;   LAPAROSCOPY  06/11/2012   Procedure: LAPAROSCOPY OPERATIVE;  Surgeon: Geryl Rankins, MD;  Location: [facility];  Service: Gynecology;  Laterality: N/A;   left ear drum rebuilt     x2   TYMPANOPLASTY Left 04/23/2016   Procedure: TYMPANOPLASTY LEFT EAR;  Surgeon: Serena Colonel, MD;  Location: [facility];  Service: ENT;  Laterality: Left;   VAGINAL HYSTERECTOMY N/A 09/01/2012   Procedure: HYSTERECTOMY VAGINAL;  Surgeon: Geryl Rankins, MD;  Location: [facility];  Service: Gynecology;  Laterality: N/A;    Family Psychiatric History: Half brother with bipolar disorder/ADHD.   Family History:  Family History  Problem Relation Age of Onset   Cancer Other        family hx   Diabetes Other        family hx   Depression Other        family hx    Coronary artery disease  Mother        also MVD   Hyperlipidemia Mother    Hypertension Mother    Hypertension Father    Diabetes Father    Diabetes Maternal Grandmother    Colon cancer Paternal Grandmother    Breast cancer Maternal Aunt    Anesthesia problems Neg Hx     Social History:  Academic/Vocational: Unemployed Social History   Socioeconomic History   Marital status: Married    Spouse name: Not on file   Number of children: 2   Years of education: GED   Highest education level: Not on file  Occupational History   Occupation: Housewife  Tobacco Use   Smoking status: Former    Packs/day: 0.25    Years: 8.00    Additional pack years: 0.00    Total pack years: 2.00    Types: Cigarettes   Smokeless tobacco: Never   Tobacco comments:    up to 1/2 ppl x 8 years  Vaping Use   Vaping Use: Never used  Substance and Sexual Activity   Alcohol use: No   Drug use: No   Sexual activity: Yes    Birth control/protection: Surgical  Other Topics  Concern   Not on file  Social History Narrative   Lives with daughter, son and husband in a one story home.  Has 2 children.  Does not work at this time.  Education: GED.    Right-handed.   4-5 twelve ounce cans per day.   Social Determinants of Health   Financial Resource Strain: Not on file  Food Insecurity: Not on file  Transportation Needs: Not on file  Physical Activity: Not on file  Stress: Not on file  Social Connections: Not on file    Allergies:  Allergies  Allergen Reactions   Penicillins Hives    Current Medications: Current Outpatient Medications  Medication Sig Dispense Refill   atorvastatin (LIPITOR) 20 MG tablet Take 20 mg by mouth daily.     budesonide-formoterol (SYMBICORT) 160-4.5 MCG/ACT inhaler Inhale 2 puffs into the lungs 2 (two) times daily.     cholecalciferol (VITAMIN D3) 25 MCG (1000 UNIT) tablet Take 1,000 Units by mouth daily.     diazepam (VALIUM) 2 MG tablet Take 1 tablet (2 mg total) by mouth daily as needed for anxiety (With 5 mg twice daily, for a total of 12 mg daily. Take in the morning.). 30 tablet 0   esomeprazole (NEXIUM) 40 MG packet Take 40 mg by mouth in the morning and at bedtime. 60 each 5   famotidine (PEPCID) 20 MG tablet Take 1 tablet (20 mg total) by mouth at bedtime. 30 tablet 1   furosemide (LASIX) 20 MG tablet Take 20 mg by mouth daily as needed.     Multiple Vitamin (MULTIVITAMIN WITH MINERALS) TABS Take 1 tablet by mouth daily.     NURTEC 75 MG TBDP Take 1 tablet by mouth as directed.     promethazine (PHENERGAN) 25 MG tablet TAKE ONE TABLET BY MOUTH EVERY 6 HOURS AS NEEDED FOR NAUSEA AND VOMITING 120 tablet 1   propranolol (INDERAL) 10 MG tablet Take 1 tablet (10 mg total) by mouth 2 (two) times daily. 60 tablet 0   QUEtiapine (SEROQUEL) 25 MG tablet Take 2 tablets (50 mg total) by mouth at bedtime. 60 tablet 1   buPROPion (WELLBUTRIN XL) 150 MG 24 hr tablet Take 1 tablet (150 mg total) by mouth every morning. 30 tablet 1    cyanocobalamin 1000 MCG tablet  Take 1,000 mcg by mouth daily. (Patient not taking: Reported on 01/21/2023)     diazepam (VALIUM) 5 MG tablet Take 1 tablet (5 mg total) by mouth 2 (two) times daily as needed for anxiety (With 2 mg daily, for a total of 12 mg daily. Take in the afternoon and evening.). 60 tablet 0   dicyclomine (BENTYL) 10 MG capsule Take by mouth. (Patient not taking: Reported on 01/28/2023)     lamoTRIgine (LAMICTAL) 200 MG tablet Take 1 tablet (200 mg total) by mouth at bedtime. 30 tablet 1   sertraline (ZOLOFT) 100 MG tablet Take 2 tablets (200 mg total) by mouth daily. 60 tablet 1   No current facility-administered medications for this visit.    ROS: Review of Systems  Objective:  Psychiatric Specialty Exam: Blood pressure 121/81, pulse 87, height 5\' 7"  (1.702 m), weight 244 lb (110.7 kg), last menstrual period 08/25/2012, SpO2 100 %.Body mass index is 38.22 kg/m.  General Appearance: {Appearance:22683}  Eye Contact:  {BHH EYE CONTACT:22684}  Speech:  {Speech:22685}  Volume:  {Volume (PAA):22686}  Mood:  {BHH MOOD:22306}  Affect:  {Affect (PAA):22687}  Thought Content: {Thought Content:22690}   Suicidal Thoughts:  {ST/HT (PAA):22692}  Homicidal Thoughts:  {ST/HT (PAA):22692}  Thought Process:  {Thought Process (PAA):22688}  Orientation:  {BHH ORIENTATION (PAA):22689}    Memory: {BHH MEMORY:22881}  Judgment:  {Judgement (PAA):22694}  Insight:  {Insight (PAA):22695}  Concentration:  {Concentration:21399}  Recall: not formally assessed ***  Fund of Knowledge: {BHH GOOD/FAIR/POOR:22877}  Language: {BHH GOOD/FAIR/POOR:22877}  Psychomotor Activity:  {Psychomotor (PAA):22696}  Akathisia:  {BHH YES OR NO:22294}  AIMS (if indicated): {Desc; done/not:10129}  Assets:  {Assets (PAA):22698}  ADL's:  {BHH ZOX'W:96045}  Cognition: {chl bhh cognition:304700322}  Sleep:  {BHH GOOD/FAIR/POOR:22877}   PE: General: well-appearing; no acute distress  Pulm: no increased work  of breathing on room air  Strength & Muscle Tone: within normal limits Neuro: no focal neurological deficits observed  Gait & Station: normal  Metabolic Disorder Labs: No results found for: "HGBA1C", "MPG" No results found for: "PROLACTIN" No results found for: "CHOL", "TRIG", "HDL", "CHOLHDL", "VLDL", "LDLCALC" Lab Results  Component Value Date   TSH 3.270 12/22/2021    Therapeutic Level Labs: No results found for: "LITHIUM" No results found for: "VALPROATE" No results found for: "CBMZ"  Screenings: PHQ2-9    Flowsheet Row Counselor from 04/04/2021 in [facility] Video Visit from 09/26/2020 [facility]  PHQ-2 Total Score 4 5  PHQ-9 Total Score 17 13      Flowsheet Row ED from 04/19/2022 in [facility] at [facility] Counselor from 04/04/2021 in [facility] Video Visit from 09/26/2020 in [facility]  C-SSRS RISK CATEGORY No Risk No Risk No Risk       Collaboration of Care: Collaboration of Care: Primary Care Provider AEB patient has PCP, Psychiatrist AEB this writer to assume psychiatric care, and Referral or follow-up with counselor/therapist AEB will discuss with patient referral to therapy during next visit  Patient/Guardian was advised Release of Information must be obtained prior to any record release in order to collaborate their care with an outside provider. Patient/Guardian was advised if they have not already done so to contact the registration department to sign all necessary forms in order for [place] to release information regarding their care.   Consent: Patient/Guardian gives verbal consent for treatment and assignment of benefits for services provided  during this visit. Patient/Guardian expressed understanding and agreed to proceed.   A total of 60 minutes was spent involved in  face to face clinical care, chart review, documentation, and discussion with attending.   Lamar Sprinkles, MD 01/28/2023, 3:04 PM

## 2023-01-29 ENCOUNTER — Encounter (HOSPITAL_COMMUNITY): Payer: Self-pay | Admitting: Student

## 2023-01-29 DIAGNOSIS — F339 Major depressive disorder, recurrent, unspecified: Secondary | ICD-10-CM | POA: Insufficient documentation

## 2023-01-29 DIAGNOSIS — F4001 Agoraphobia with panic disorder: Secondary | ICD-10-CM | POA: Insufficient documentation

## 2023-01-30 NOTE — Addendum Note (Signed)
Addended by: Everlena Cooper on: 01/30/2023 10:08 AM   Modules accepted: Level of Service

## 2023-02-27 ENCOUNTER — Telehealth (HOSPITAL_COMMUNITY): Payer: Self-pay | Admitting: Student

## 2023-02-27 NOTE — Telephone Encounter (Signed)
Patient called and canceled 8/12 appointment. Patient stated she fell and is currently in physical therapy. Patient is not able to drive to appointments. Patient stated she will call to reschedule when able to.

## 2023-03-04 ENCOUNTER — Ambulatory Visit (HOSPITAL_COMMUNITY): Payer: No Typology Code available for payment source | Admitting: Student

## 2023-03-11 ENCOUNTER — Other Ambulatory Visit: Payer: Self-pay | Admitting: Gastroenterology

## 2023-03-11 DIAGNOSIS — R110 Nausea: Secondary | ICD-10-CM

## 2023-03-14 ENCOUNTER — Other Ambulatory Visit: Payer: Self-pay | Admitting: Gastroenterology

## 2023-03-19 ENCOUNTER — Encounter: Payer: Self-pay | Admitting: Gastroenterology

## 2023-04-08 ENCOUNTER — Other Ambulatory Visit: Payer: Self-pay | Admitting: Gastroenterology

## 2023-04-21 NOTE — H&P (View-Only) (Signed)
GI Office Note    Referring Provider: Donetta Potts, MD Primary Care Physician:  Donetta Potts, MD Primary Gastroenterologist: Gerrit Friends.Rourk, MD   Date:  04/22/2023  ID:  [name], DOB 03/05/1976, MRN [id number]   Chief Complaint   Chief Complaint  Patient presents with   Follow-up    Pt here for 3 month follow up on IBS-C   History of Present Illness  Monica Ballard is a 47 y.o. female with a history of  IBS, anemia, anxiety/depression, asthma, GERD, HTN, HLD, chronic nausea and cholecystectomy presenting today for follow up of chronic GI complaints.   Colonoscopy in 2003 by Dr. Arlyce Dice: -Completely normal colonoscopy   EGD with [facility] July 2019: -Normal esophagus -Normal duodenum -Gastritis s/p biopsy -Hiatal hernia 2 cm -Advised PPI daily -Pathology report not provided.  Advised to perform colonoscopy in the near future.  Patient seen by [facility] gastroenterology in January 2020 with report of lower and generalized crampy abdominal pain usually relieved by bowel movement. Well-controlled with Bentyl. Does not constipation and gets bloated with MiraLAX twice daily. History of diaphragmatic hernia in 2019. History of reflux with primarily nocturnal symptoms despite Protonix. Noted family history of colon cancer in her paternal great grandmother and paternal grandmother. Also with complaints of nausea. She was advised to take mag citrate for 1 day and then start MiraLAX 17 g at bedtime for 30 days. Dicyclomine refilled. Advised to continue pantoprazole. Given Phenergan to use as needed for nausea. Also advised famotidine 20 mg at bedtime. GERD diet discussed.   Office visit 11/26/22.  Still with chronic nausea also with tenderness on the left rib cage that feels numb at times.  Also with some right upper quadrant cramping at times.  Has to apply heat once medication kicks in and she burps and passes gas and is still some pain usually occurs after eating and then at  times feels like she has to go but is unable to go.  Was having Bristol 1 stools and not going for up to 10 days at a time and is in her stool.  Has been trying a probiotic yogurt on a daily basis.  No relief with MiraLAX in the past or with Benefiber or Metamucil.  Been on pantoprazole 40 mg once daily for years and recently increased to twice daily and felt like it was possibly working but wakes up with regurgitation.  Taking Phenergan regularly.  Denies any NSAID use. Linzess provided. Stop pantoprazole and start nexium. Famotidine nightly as neded. Phenergan refilled to use as needed. Gas ex as needed. Dicyclomine as needed for abdominal pain. Probiotic yogurt. Request prior colonoscopy records and labs.    Linzess caused significant diarrhea even at low dose so advised to stop and then start fiber supplementation.    Labs April 2024: WBC 11, hemoglobin 14, alk phos 124, CMP otherwise unremarkable.  Lipid panel with LDL.  TSH normal.  B12 and folate normal.  Last office visit 01/21/23. Taking metamucil nightly for a week, linzess to strong. Sometimes small balls and hard. Still having bloating, especially in RUQ. Still with intermittent nausea. Taking phenergan 2-3 times daily. Slight improvement with nexium. Taking dicyclomine as needed. Advised lactaid, sucraid testing, celiac and alpha gal labs. Continue nexium 40 BID, pepcid as needed. Add stool softener, continue metamucil, phenergan. Consider EGD and/or GES. Advised 3 months f/u.  Labs 01/21/23: Celiac and alpha gal negative.   Sucrase breath test negative.  Today:  Constipation -  twice daily 1 capful. Still not having consistent bowel movements. Somewhat has softened them up. Still having bristol 1 sometimes followed by number 5. Linzess over did it in terms of frequency. Not having much abdominal pain, all located in her back. Still having lots of bloating. Is better in the mornings and gets worse throughout the days   States he has continued  to gain weight but recently started some new medications. Found out that she is going through menopause. GYN recently drew hormone levels.   Stays thirsty all the time.   Had fall in June and messed up her lower back and it catches and when that happens it goes down her legs and then gets numbness in feet, legs and hands. Pain is above tailbone and then move up. Sometimes pain wakes her up.   GERD, nausea - about the same. Has been doing the Nexium twice daily. Has been taking pepcid at night. Having to take gas ex to help. Sometimes has indigestion and wake her up like she feels like she may puke. Previously took Protonix. Using phenergan about 3 times per day, fear of feeling nauseas and not able to ea. At ties when she doesn't take it she starts to feel nauseas. Sometimes feels like it is hard to initiate swallow.   Having more flatulence and burping.    Current Outpatient Medications  Medication Sig Dispense Refill   budesonide-formoterol (SYMBICORT) 160-4.5 MCG/ACT inhaler Inhale 2 puffs into the lungs 2 (two) times daily.     Calcium-Magnesium-Vitamin D 200-100-33.3 MG-MG-UNIT CAPS Take by mouth.     cariprazine (VRAYLAR) 3 MG capsule Take 3 mg by mouth at bedtime.     cholecalciferol (VITAMIN D3) 25 MCG (1000 UNIT) tablet Take 1,000 Units by mouth daily.     esomeprazole (NEXIUM) 40 MG packet Take 40 mg by mouth in the morning and at bedtime. 60 each 5   famotidine (PEPCID) 20 MG tablet TAKE ONE TABLET BY MOUTH AT BEDTIME 30 tablet 1   furosemide (LASIX) 20 MG tablet Take 20 mg by mouth daily as needed.     lamoTRIgine (LAMICTAL) 200 MG tablet Take 1 tablet (200 mg total) by mouth at bedtime. 30 tablet 1   Multiple Vitamin (MULTIVITAMIN WITH MINERALS) TABS Take 1 tablet by mouth daily.     NURTEC 75 MG TBDP Take 1 tablet by mouth as directed.     promethazine (PHENERGAN) 25 MG tablet TAKE ONE TABLET BY MOUTH EVERY 6 HOURS AS NEEDED FOR NAUSEA AND VOMITING 120 tablet 1   traZODone  (DESYREL) 150 MG tablet Take 100 mg by mouth at bedtime.     atorvastatin (LIPITOR) 20 MG tablet Take 20 mg by mouth daily.     buPROPion (WELLBUTRIN XL) 150 MG 24 hr tablet Take 1 tablet (150 mg total) by mouth every morning. 30 tablet 1   cyanocobalamin 1000 MCG tablet Take 1,000 mcg by mouth daily. (Patient not taking: Reported on 01/21/2023)     dicyclomine (BENTYL) 10 MG capsule Take by mouth. (Patient not taking: Reported on 01/28/2023)     sertraline (ZOLOFT) 100 MG tablet Take 2 tablets (200 mg total) by mouth daily. 60 tablet 1   No current facility-administered medications for this visit.    Past Medical History:  Diagnosis Date   Anemia    Anxiety    Asthma    smoker-uses nebulizer-not used in last year, albuterol rescue inhaler used in 11/13 preoperatively   Bipolar 1 disorder (HCC)  BMI 35.0-35.9,adult    Cervical disc disease    Chest pain    Chronic bronchitis (HCC)    nebulizer and rescue inhaler   Depression    Edema    GERD (gastroesophageal reflux disease)    occasional -tums   Headache(784.0)    Hearing loss in left ear    Hypercholesteremia    Hyperlipidemia    Hypertension    Menopausal symptoms    Missed ab 01/09/2012   10weeks-used cytotec   Numbness and tingling    PONV (postoperative nausea and vomiting)    Preterm labor    Right upper quadrant abdominal pain    SOB (shortness of breath)    Teeth decayed    lower , nothing loose-encouraged pt to seek dental care if necessary    Past Surgical History:  Procedure Laterality Date   ABDOMINAL HYSTERECTOMY     CHOLECYSTECTOMY     CHOLECYSTECTOMY, LAPAROSCOPIC  12/06/2010   DILATION AND CURETTAGE OF UTERUS  06/11/2012   Procedure: DILATATION AND CURETTAGE;  Surgeon: Geryl Rankins, MD;  Location: [facility];  Service: Gynecology;  Laterality: N/A;  sec procedure start  1241   DILATION AND EVACUATION  01/16/2012   Procedure: DILATATION AND EVACUATION;  Surgeon: Geryl Rankins, MD;  Location: [facility];   Service: Gynecology;  Laterality: N/A;   LAPAROSCOPY  06/11/2012   Procedure: LAPAROSCOPY OPERATIVE;  Surgeon: Geryl Rankins, MD;  Location: [facility];  Service: Gynecology;  Laterality: N/A;   left ear drum rebuilt     x2   TYMPANOPLASTY Left 04/23/2016   Procedure: TYMPANOPLASTY LEFT EAR;  Surgeon: Serena Colonel, MD;  Location:  SURGERY CENTER;  Service: ENT;  Laterality: Left;   VAGINAL HYSTERECTOMY N/A 09/01/2012   Procedure: HYSTERECTOMY VAGINAL;  Surgeon: Geryl Rankins, MD;  Location: [facility];  Service: Gynecology;  Laterality: N/A;    Family History  Problem Relation Age of Onset   Cancer Other        family hx   Diabetes Other        family hx   Depression Other        family hx    Coronary artery disease Mother        also MVD   Hyperlipidemia Mother    Hypertension Mother    Hypertension Father    Diabetes Father    Diabetes Maternal Grandmother    Colon cancer Paternal Grandmother    Breast cancer Maternal Aunt    Anesthesia problems Neg Hx     Allergies as of 04/22/2023 - Review Complete 04/22/2023  Allergen Reaction Noted   Penicillins Hives 01/25/2010    Social History   Socioeconomic History   Marital status: Married    Spouse name: Not on file   Number of children: 2   Years of education: GED   Highest education level: Not on file  Occupational History   Occupation: Housewife  Tobacco Use   Smoking status: Former    Current packs/day: 0.25    Average packs/day: 0.3 packs/day for 8.0 years (2.0 ttl pk-yrs)    Types: Cigarettes   Smokeless tobacco: Never   Tobacco comments:    up to 1/2 ppl x 8 years  Vaping Use   Vaping status: Never Used  Substance and Sexual Activity   Alcohol use: No   Drug use: No   Sexual activity: Yes    Birth control/protection: Surgical  Other Topics Concern   Not on file  Social History Narrative  Lives with daughter, son and husband in a one story home.  Has 2 children.  Does not work at this time.   Education: GED.    Right-handed.   4-5 twelve ounce cans per day.   Social Determinants of Health   Financial Resource Strain: Not on file  Food Insecurity: Not on file  Transportation Needs: Not on file  Physical Activity: Not on file  Stress: Not on file  Social Connections: Not on file     Review of Systems   Gen: + weight gain. Denies fever, chills, anorexia. Denies fatigue, weakness, weight loss.  CV: Denies chest pain, palpitations, syncope, peripheral edema, and claudication. Resp: Denies dyspnea at rest, cough, wheezing, coughing up blood, and pleurisy. GI: See HPI Derm: Denies rash, itching, dry skin Psych: + anxiety/depression. Denies memory loss, confusion. No homicidal or suicidal ideation.  Heme: Denies bruising, bleeding, and enlarged lymph nodes. + back pain.   Physical Exam   BP 126/66   Pulse 65   Temp 98.3 F (36.8 C)   Ht 5\' 7"  (1.702 m)   Wt 254 lb 6.4 oz (115.4 kg)   LMP 08/25/2012   BMI 39.84 kg/m   General:   Alert and oriented. No distress noted. Pleasant and cooperative.  Head:  Normocephalic and atraumatic. Eyes:  Conjuctiva clear without scleral icterus. Mouth:  Oral mucosa pink and moist. Good dentition. No lesions. Lungs:  Clear to auscultation bilaterally. No wheezes, rales, or rhonchi. No distress.  Heart:  S1, S2 present without murmurs appreciated.  Abdomen:  +BS, soft, non-tender and non-distended. No rebound or guarding. No HSM or masses noted. Rectal: deferred Msk:  Symmetrical without gross deformities. Normal posture. Extremities:  Without edema. Neurologic:  Alert and  oriented x4 Psych:  Alert and cooperative. Normal mood and affect.  Assessment  Monica Ballard is a 47 y.o. female with a history of IBS, anemia, anxiety/depression, asthma, GERD, HTN, HLD, chronic nausea and cholecystectomy presenting today for follow up of chronic GI complaints.    IBS - constipation, bloating: Failed Linzess past due to excessive diarrhea.   Previously also tried Metamucil which helped temporarily but also likely caused some increased bloating and flatulence.  Has tried MiraLAX 1 capful twice daily without any significant improvement in frequency of bowel movements, possibly has softened a little.  Struggling with Bristol 1 stools followed by Frances Furbish #5 (likely somewhat overflow).  Denies any abdominal pain recently.  For bloating she has had negative celiac and alpha gal panel workup as well as negative for sucrase deficiency.  Ongoing bloating likely secondary to acid reflux and constipation.  Should continue to avoid lactose and will trial Amitiza 8 mcg once daily and if not having significant diarrhea then will increase to twice daily if still not having much improvement in bowel frequency.  Advised to trial Phazyme 250 mg daily as needed for bloating/gassiness.  GERD, Nausea, LUQ pain: Continues to report polydipsia.  Continuing to have nausea on a daily basis as well as some acid reflux symptoms.  Taking Nexium twice daily with Pepcid 20 mg nightly.  Sometimes indigestion still wakes her up at night feeling like she may need to vomit to feel better.  Has failed Protonix in the past.  Continues to take Phenergan about 3 times daily for nausea, sometimes been trying to skip a dose she starts to feel nauseous.  Recently feel like hard to initiate swallow but not on a regular basis.  Will increase famotidine to 20-40 mg  nightly.  Will proceed with upper endoscopy for further evaluation given last EGD in 2019 and no improvement of symptoms.  Advised that with ongoing nausea we could consider a gastric emptying study in the future if EGD negative.  PLAN   Trial Amitiza 8 mcg daily with food and then increase to BID if no improvement.  Proceed with upper endoscopy with propofol by Dr. Jena Gauss in near future: the risks, benefits, and alternatives have been discussed with the patient in detail. The patient states understanding and desires to proceed.  ASA 2 Avoid lactose or use lactaid Continue nexium 40 mg BID Continue phenergan needed Trial Phazyme daily Pepcid 20-40 mg nightly.  Consider GES if ongoing nausea despite control of constipation.  Request prior colonoscopy records. Follow up in 3 months.     Brooke Bonito, MSN, FNP-BC, AGACNP-BC [facility]

## 2023-04-21 NOTE — Progress Notes (Unsigned)
GI Office Note    Referring Provider: Donetta Potts, MD Primary Care Physician:  Donetta Potts, MD Primary Gastroenterologist: Gerrit Friends.Rourk, MD   Date:  04/22/2023  ID:  Monica Ballard, DOB 03/05/1976, MRN [id number]   Chief Complaint   Chief Complaint  Patient presents with   Follow-up    Pt here for 3 month follow up on IBS-C   History of Present Illness  Monica Ballard is a 47 y.o. female with a history of  IBS, anemia, anxiety/depression, asthma, GERD, HTN, HLD, chronic nausea and cholecystectomy presenting today for follow up of chronic GI complaints.   Colonoscopy in 2003 by Dr. Arlyce Dice: -Completely normal colonoscopy   EGD with [facility] July 2019: -Normal esophagus -Normal duodenum -Gastritis s/p biopsy -Hiatal hernia 2 cm -Advised PPI daily -Pathology report not provided.  Advised to perform colonoscopy in the near future.  Patient seen by [facility] gastroenterology in January 2020 with report of lower and generalized crampy abdominal pain usually relieved by bowel movement. Well-controlled with Bentyl. Does not constipation and gets bloated with MiraLAX twice daily. History of diaphragmatic hernia in 2019. History of reflux with primarily nocturnal symptoms despite Protonix. Noted family history of colon cancer in her paternal great grandmother and paternal grandmother. Also with complaints of nausea. She was advised to take mag citrate for 1 day and then start MiraLAX 17 g at bedtime for 30 days. Dicyclomine refilled. Advised to continue pantoprazole. Given Phenergan to use as needed for nausea. Also advised famotidine 20 mg at bedtime. GERD diet discussed.   Office visit 11/26/22.  Still with chronic nausea also with tenderness on the left rib cage that feels numb at times.  Also with some right upper quadrant cramping at times.  Has to apply heat once medication kicks in and she burps and passes gas and is still some pain usually occurs after eating and then at  times feels like she has to go but is unable to go.  Was having Bristol 1 stools and not going for up to 10 days at a time and is in her stool.  Has been trying a probiotic yogurt on a daily basis.  No relief with MiraLAX in the past or with Benefiber or Metamucil.  Been on pantoprazole 40 mg once daily for years and recently increased to twice daily and felt like it was possibly working but wakes up with regurgitation.  Taking Phenergan regularly.  Denies any NSAID use. Linzess provided. Stop pantoprazole and start nexium. Famotidine nightly as neded. Phenergan refilled to use as needed. Gas ex as needed. Dicyclomine as needed for abdominal pain. Probiotic yogurt. Request prior colonoscopy records and labs.    Linzess caused significant diarrhea even at low dose so advised to stop and then start fiber supplementation.    Labs April 2024: WBC 11, hemoglobin 14, alk phos 124, CMP otherwise unremarkable.  Lipid panel with LDL.  TSH normal.  B12 and folate normal.  Last office visit 01/21/23. Taking metamucil nightly for a week, linzess to strong. Sometimes small balls and hard. Still having bloating, especially in RUQ. Still with intermittent nausea. Taking phenergan 2-3 times daily. Slight improvement with nexium. Taking dicyclomine as needed. Advised lactaid, sucraid testing, celiac and alpha gal labs. Continue nexium 40 BID, pepcid as needed. Add stool softener, continue metamucil, phenergan. Consider EGD and/or GES. Advised 3 months f/u.  Labs 01/21/23: Celiac and alpha gal negative.   Sucrase breath test negative.  Today:  Constipation -  twice daily 1 capful. Still not having consistent bowel movements. Somewhat has softened them up. Still having bristol 1 sometimes followed by number 5. Linzess over did it in terms of frequency. Not having much abdominal pain, all located in her back. Still having lots of bloating. Is better in the mornings and gets worse throughout the days   States he has continued  to gain weight but recently started some new medications. Found out that she is going through menopause. GYN recently drew hormone levels.   Stays thirsty all the time.   Had fall in June and messed up her lower back and it catches and when that happens it goes down her legs and then gets numbness in feet, legs and hands. Pain is above tailbone and then move up. Sometimes pain wakes her up.   GERD, nausea - about the same. Has been doing the Nexium twice daily. Has been taking pepcid at night. Having to take gas ex to help. Sometimes has indigestion and wake her up like she feels like she may puke. Previously took Protonix. Using phenergan about 3 times per day, fear of feeling nauseas and not able to ea. At ties when she doesn't take it she starts to feel nauseas. Sometimes feels like it is hard to initiate swallow.   Having more flatulence and burping.    Current Outpatient Medications  Medication Sig Dispense Refill   budesonide-formoterol (SYMBICORT) 160-4.5 MCG/ACT inhaler Inhale 2 puffs into the lungs 2 (two) times daily.     Calcium-Magnesium-Vitamin D 200-100-33.3 MG-MG-UNIT CAPS Take by mouth.     cariprazine (VRAYLAR) 3 MG capsule Take 3 mg by mouth at bedtime.     cholecalciferol (VITAMIN D3) 25 MCG (1000 UNIT) tablet Take 1,000 Units by mouth daily.     esomeprazole (NEXIUM) 40 MG packet Take 40 mg by mouth in the morning and at bedtime. 60 each 5   famotidine (PEPCID) 20 MG tablet TAKE ONE TABLET BY MOUTH AT BEDTIME 30 tablet 1   furosemide (LASIX) 20 MG tablet Take 20 mg by mouth daily as needed.     lamoTRIgine (LAMICTAL) 200 MG tablet Take 1 tablet (200 mg total) by mouth at bedtime. 30 tablet 1   Multiple Vitamin (MULTIVITAMIN WITH MINERALS) TABS Take 1 tablet by mouth daily.     NURTEC 75 MG TBDP Take 1 tablet by mouth as directed.     promethazine (PHENERGAN) 25 MG tablet TAKE ONE TABLET BY MOUTH EVERY 6 HOURS AS NEEDED FOR NAUSEA AND VOMITING 120 tablet 1   traZODone  (DESYREL) 150 MG tablet Take 100 mg by mouth at bedtime.     atorvastatin (LIPITOR) 20 MG tablet Take 20 mg by mouth daily.     buPROPion (WELLBUTRIN XL) 150 MG 24 hr tablet Take 1 tablet (150 mg total) by mouth every morning. 30 tablet 1   cyanocobalamin 1000 MCG tablet Take 1,000 mcg by mouth daily. (Patient not taking: Reported on 01/21/2023)     dicyclomine (BENTYL) 10 MG capsule Take by mouth. (Patient not taking: Reported on 01/28/2023)     sertraline (ZOLOFT) 100 MG tablet Take 2 tablets (200 mg total) by mouth daily. 60 tablet 1   No current facility-administered medications for this visit.    Past Medical History:  Diagnosis Date   Anemia    Anxiety    Asthma    smoker-uses nebulizer-not used in last year, albuterol rescue inhaler used in 11/13 preoperatively   Bipolar 1 disorder (HCC)  BMI 35.0-35.9,adult    Cervical disc disease    Chest pain    Chronic bronchitis (HCC)    nebulizer and rescue inhaler   Depression    Edema    GERD (gastroesophageal reflux disease)    occasional -tums   Headache(784.0)    Hearing loss in left ear    Hypercholesteremia    Hyperlipidemia    Hypertension    Menopausal symptoms    Missed ab 01/09/2012   10weeks-used cytotec   Numbness and tingling    PONV (postoperative nausea and vomiting)    Preterm labor    Right upper quadrant abdominal pain    SOB (shortness of breath)    Teeth decayed    lower , nothing loose-encouraged pt to seek dental care if necessary    Past Surgical History:  Procedure Laterality Date   ABDOMINAL HYSTERECTOMY     CHOLECYSTECTOMY     CHOLECYSTECTOMY, LAPAROSCOPIC  12/06/2010   DILATION AND CURETTAGE OF UTERUS  06/11/2012   Procedure: DILATATION AND CURETTAGE;  Surgeon: Geryl Rankins, MD;  Location: [facility];  Service: Gynecology;  Laterality: N/A;  sec procedure start  1241   DILATION AND EVACUATION  01/16/2012   Procedure: DILATATION AND EVACUATION;  Surgeon: Geryl Rankins, MD;  Location: [facility];   Service: Gynecology;  Laterality: N/A;   LAPAROSCOPY  06/11/2012   Procedure: LAPAROSCOPY OPERATIVE;  Surgeon: Geryl Rankins, MD;  Location: [facility];  Service: Gynecology;  Laterality: N/A;   left ear drum rebuilt     x2   TYMPANOPLASTY Left 04/23/2016   Procedure: TYMPANOPLASTY LEFT EAR;  Surgeon: Serena Colonel, MD;  Location:  SURGERY CENTER;  Service: ENT;  Laterality: Left;   VAGINAL HYSTERECTOMY N/A 09/01/2012   Procedure: HYSTERECTOMY VAGINAL;  Surgeon: Geryl Rankins, MD;  Location: [facility];  Service: Gynecology;  Laterality: N/A;    Family History  Problem Relation Age of Onset   Cancer Other        family hx   Diabetes Other        family hx   Depression Other        family hx    Coronary artery disease Mother        also MVD   Hyperlipidemia Mother    Hypertension Mother    Hypertension Father    Diabetes Father    Diabetes Maternal Grandmother    Colon cancer Paternal Grandmother    Breast cancer Maternal Aunt    Anesthesia problems Neg Hx     Allergies as of 04/22/2023 - Review Complete 04/22/2023  Allergen Reaction Noted   Penicillins Hives 01/25/2010    Social History   Socioeconomic History   Marital status: Married    Spouse name: Not on file   Number of children: 2   Years of education: GED   Highest education level: Not on file  Occupational History   Occupation: Housewife  Tobacco Use   Smoking status: Former    Current packs/day: 0.25    Average packs/day: 0.3 packs/day for 8.0 years (2.0 ttl pk-yrs)    Types: Cigarettes   Smokeless tobacco: Never   Tobacco comments:    up to 1/2 ppl x 8 years  Vaping Use   Vaping status: Never Used  Substance and Sexual Activity   Alcohol use: No   Drug use: No   Sexual activity: Yes    Birth control/protection: Surgical  Other Topics Concern   Not on file  Social History Narrative  Lives with daughter, son and husband in a one story home.  Has 2 children.  Does not work at this time.   Education: GED.    Right-handed.   4-5 twelve ounce cans per day.   Social Determinants of Health   Financial Resource Strain: Not on file  Food Insecurity: Not on file  Transportation Needs: Not on file  Physical Activity: Not on file  Stress: Not on file  Social Connections: Not on file     Review of Systems   Gen: + weight gain. Denies fever, chills, anorexia. Denies fatigue, weakness, weight loss.  CV: Denies chest pain, palpitations, syncope, peripheral edema, and claudication. Resp: Denies dyspnea at rest, cough, wheezing, coughing up blood, and pleurisy. GI: See HPI Derm: Denies rash, itching, dry skin Psych: + anxiety/depression. Denies memory loss, confusion. No homicidal or suicidal ideation.  Heme: Denies bruising, bleeding, and enlarged lymph nodes. + back pain.   Physical Exam   BP 126/66   Pulse 65   Temp 98.3 F (36.8 C)   Ht 5\' 7"  (1.702 m)   Wt 254 lb 6.4 oz (115.4 kg)   LMP 08/25/2012   BMI 39.84 kg/m   General:   Alert and oriented. No distress noted. Pleasant and cooperative.  Head:  Normocephalic and atraumatic. Eyes:  Conjuctiva clear without scleral icterus. Mouth:  Oral mucosa pink and moist. Good dentition. No lesions. Lungs:  Clear to auscultation bilaterally. No wheezes, rales, or rhonchi. No distress.  Heart:  S1, S2 present without murmurs appreciated.  Abdomen:  +BS, soft, non-tender and non-distended. No rebound or guarding. No HSM or masses noted. Rectal: deferred Msk:  Symmetrical without gross deformities. Normal posture. Extremities:  Without edema. Neurologic:  Alert and  oriented x4 Psych:  Alert and cooperative. Normal mood and affect.  Assessment  Monica Ballard is a 47 y.o. female with a history of IBS, anemia, anxiety/depression, asthma, GERD, HTN, HLD, chronic nausea and cholecystectomy presenting today for follow up of chronic GI complaints.    IBS - constipation, bloating: Failed Linzess past due to excessive diarrhea.   Previously also tried Metamucil which helped temporarily but also likely caused some increased bloating and flatulence.  Has tried MiraLAX 1 capful twice daily without any significant improvement in frequency of bowel movements, possibly has softened a little.  Struggling with Bristol 1 stools followed by Monica Ballard #5 (likely somewhat overflow).  Denies any abdominal pain recently.  For bloating she has had negative celiac and alpha gal panel workup as well as negative for sucrase deficiency.  Ongoing bloating likely secondary to acid reflux and constipation.  Should continue to avoid lactose and will trial Amitiza 8 mcg once daily and if not having significant diarrhea then will increase to twice daily if still not having much improvement in bowel frequency.  Advised to trial Phazyme 250 mg daily as needed for bloating/gassiness.  GERD, Nausea, LUQ pain: Continues to report polydipsia.  Continuing to have nausea on a daily basis as well as some acid reflux symptoms.  Taking Nexium twice daily with Pepcid 20 mg nightly.  Sometimes indigestion still wakes her up at night feeling like she may need to vomit to feel better.  Has failed Protonix in the past.  Continues to take Phenergan about 3 times daily for nausea, sometimes been trying to skip a dose she starts to feel nauseous.  Recently feel like hard to initiate swallow but not on a regular basis.  Will increase famotidine to 20-40 mg  nightly.  Will proceed with upper endoscopy for further evaluation given last EGD in 2019 and no improvement of symptoms.  Advised that with ongoing nausea we could consider a gastric emptying study in the future if EGD negative.  PLAN   Trial Amitiza 8 mcg daily with food and then increase to BID if no improvement.  Proceed with upper endoscopy with propofol by Dr. Jena Gauss in near future: the risks, benefits, and alternatives have been discussed with the patient in detail. The patient states understanding and desires to proceed.  ASA 2 Avoid lactose or use lactaid Continue nexium 40 mg BID Continue phenergan needed Trial Phazyme daily Pepcid 20-40 mg nightly.  Consider GES if ongoing nausea despite control of constipation.  Request prior colonoscopy records. Follow up in 3 months.     Brooke Bonito, MSN, FNP-BC, AGACNP-BC [facility]

## 2023-04-22 ENCOUNTER — Telehealth: Payer: Self-pay | Admitting: *Deleted

## 2023-04-22 ENCOUNTER — Ambulatory Visit (INDEPENDENT_AMBULATORY_CARE_PROVIDER_SITE_OTHER): Payer: No Typology Code available for payment source | Admitting: Gastroenterology

## 2023-04-22 ENCOUNTER — Encounter: Payer: Self-pay | Admitting: Gastroenterology

## 2023-04-22 VITALS — BP 126/66 | HR 65 | Temp 98.3°F | Ht 67.0 in | Wt 254.4 lb

## 2023-04-22 DIAGNOSIS — R140 Abdominal distension (gaseous): Secondary | ICD-10-CM

## 2023-04-22 DIAGNOSIS — R110 Nausea: Secondary | ICD-10-CM

## 2023-04-22 DIAGNOSIS — K581 Irritable bowel syndrome with constipation: Secondary | ICD-10-CM | POA: Diagnosis not present

## 2023-04-22 DIAGNOSIS — K219 Gastro-esophageal reflux disease without esophagitis: Secondary | ICD-10-CM

## 2023-04-22 DIAGNOSIS — R109 Unspecified abdominal pain: Secondary | ICD-10-CM

## 2023-04-22 MED ORDER — LUBIPROSTONE 8 MCG PO CAPS: 8.0000 ug | ORAL_CAPSULE | Freq: Two times a day (BID) | ORAL | 2 refills | Status: DC

## 2023-04-22 NOTE — Patient Instructions (Addendum)
We we will call you to get you scheduled for an upper endoscopy in the near future with Dr. Jena Gauss.  I have sent in Amitiza for you to take 8 mcg once daily.  You can start by taking this either in the morning or at nighttime with food.  If you are not having an improvement with constipation within 1-2 weeks and you can increase to the standard dose of twice daily.  Please let me know if there is any issues with cost or issues obtaining the medication or if insurance is requiring other alternatives.  For your reflux continue taking Nexium 40 mg twice daily.  Can continue taking Phenergan up to 3 times a day as needed for nausea.  Trial Phazyme for your bloating.  There is an extra strength and then ultra strength.  Please start out with the extra strength which is usually 250 mg of simethicone.  Continue taking Pepcid 20 mg nightly, if you wish you can take an additional dose for a total of 40 mg nightly on days where you are having more severe reflux with lying down at night.  If you start needing this morning regular basis or it is helpful please let me know and I will update your prescription.  We again we will request your prior colonoscopy records from [facility].  It was a pleasure to see you today. I want to create trusting relationships with patients. If you receive a survey regarding your visit,  I greatly appreciate you taking time to fill this out on paper or through your MyChart. I value your feedback.  Brooke Bonito, MSN, FNP-BC, AGACNP-BC [facility]

## 2023-04-22 NOTE — Telephone Encounter (Signed)
[facility]  EGD w/Dr.Carver, asa 2

## 2023-04-29 ENCOUNTER — Telehealth: Payer: Self-pay | Admitting: Gastroenterology

## 2023-04-29 NOTE — Telephone Encounter (Signed)
Received ample GI records.  Last office visit note included as well as EGD report.  No colonoscopy report received.  Appears there was plans to potentially repeat a colonoscopy given patient's family history of colon cancer in her great-grandmother and grandmother on her father side.  Reportedly awaiting for change in insurance for plan for colonoscopy in January 2020.  Does not appear that this was performed.  Need to discuss this at her next office visit.  Given unable to find colonoscopy report and given her family history if she did not in fact have a colonoscopy in January 2020 then given her age I would recommend a colonoscopy for screening purposes despite a normal colonoscopy in 2003.  Brooke Bonito, MSN, APRN, FNP-BC, AGACNP-BC [facility]

## 2023-04-30 ENCOUNTER — Encounter: Payer: Self-pay | Admitting: *Deleted

## 2023-04-30 ENCOUNTER — Other Ambulatory Visit: Payer: Self-pay | Admitting: *Deleted

## 2023-04-30 DIAGNOSIS — R112 Nausea with vomiting, unspecified: Secondary | ICD-10-CM

## 2023-04-30 DIAGNOSIS — R109 Unspecified abdominal pain: Secondary | ICD-10-CM

## 2023-04-30 DIAGNOSIS — K219 Gastro-esophageal reflux disease without esophagitis: Secondary | ICD-10-CM

## 2023-04-30 NOTE — Telephone Encounter (Signed)
Pt has been scheduled with Dr.Rourk on 05/16/23. Instructions sent via MyChart.    [employer] PA: Notification or Prior Authorization is not required for the requested services You are not required to submit a notification/prior authorization based on the information provided. The number above acknowledges your inquiry and our response. Please reference this number for future inquiries. Notification is not a guarantee of coverage or payment. Questions should be directed to [email or web address] > Eligibility or [phone]. Decision ID #: [id number]

## 2023-05-06 ENCOUNTER — Other Ambulatory Visit: Payer: Self-pay

## 2023-05-06 DIAGNOSIS — I83893 Varicose veins of bilateral lower extremities with other complications: Secondary | ICD-10-CM

## 2023-05-08 ENCOUNTER — Other Ambulatory Visit (HOSPITAL_COMMUNITY)
Admission: RE | Admit: 2023-05-08 | Discharge: 2023-05-08 | Disposition: A | Discharge status: Home / Self Care | Payer: No Typology Code available for payment source | Source: Ambulatory Visit | Attending: Internal Medicine | Admitting: Internal Medicine

## 2023-05-08 ENCOUNTER — Other Ambulatory Visit: Payer: Self-pay | Admitting: Gastroenterology

## 2023-05-08 DIAGNOSIS — R110 Nausea: Secondary | ICD-10-CM

## 2023-05-08 DIAGNOSIS — R112 Nausea with vomiting, unspecified: Secondary | ICD-10-CM | POA: Insufficient documentation

## 2023-05-08 DIAGNOSIS — R109 Unspecified abdominal pain: Secondary | ICD-10-CM | POA: Diagnosis present

## 2023-05-08 DIAGNOSIS — K219 Gastro-esophageal reflux disease without esophagitis: Secondary | ICD-10-CM | POA: Insufficient documentation

## 2023-05-08 LAB — BASIC METABOLIC PANEL
Anion gap: 9 (ref 5–15)
BUN: 8 mg/dL (ref 6–20)
CO2: 27 mmol/L (ref 22–32)
Calcium: 8.7 mg/dL — ABNORMAL LOW (ref 8.9–10.3)
Chloride: 101 mmol/L (ref 98–111)
Creatinine, Ser: 0.97 mg/dL (ref 0.44–1.00)
GFR, Estimated: >60 mL/min (ref >60)
Glucose, Bld: 93 mg/dL (ref 70–99)
Potassium: 3.6 mmol/L (ref 3.5–5.1)
Sodium: 137 mmol/L (ref 135–145)

## 2023-05-16 ENCOUNTER — Ambulatory Visit (HOSPITAL_COMMUNITY): Payer: No Typology Code available for payment source | Admitting: Anesthesiology

## 2023-05-16 ENCOUNTER — Other Ambulatory Visit: Payer: Self-pay

## 2023-05-16 ENCOUNTER — Encounter (HOSPITAL_COMMUNITY)
Admission: RE | Disposition: A | Discharge status: Home / Self Care | Payer: Self-pay | Source: Home / Self Care | Attending: Internal Medicine

## 2023-05-16 ENCOUNTER — Telehealth: Payer: Self-pay

## 2023-05-16 ENCOUNTER — Encounter (HOSPITAL_COMMUNITY): Payer: Self-pay | Admitting: Internal Medicine

## 2023-05-16 ENCOUNTER — Ambulatory Visit (HOSPITAL_COMMUNITY)
Admission: RE | Admit: 2023-05-16 | Discharge: 2023-05-16 | Disposition: A | Discharge status: Home / Self Care | Payer: No Typology Code available for payment source | Attending: Internal Medicine | Admitting: Internal Medicine

## 2023-05-16 DIAGNOSIS — R519 Headache, unspecified: Secondary | ICD-10-CM | POA: Diagnosis not present

## 2023-05-16 DIAGNOSIS — R12 Heartburn: Secondary | ICD-10-CM | POA: Diagnosis not present

## 2023-05-16 DIAGNOSIS — E785 Hyperlipidemia, unspecified: Secondary | ICD-10-CM | POA: Insufficient documentation

## 2023-05-16 DIAGNOSIS — K3189 Other diseases of stomach and duodenum: Secondary | ICD-10-CM | POA: Diagnosis not present

## 2023-05-16 DIAGNOSIS — R198 Other specified symptoms and signs involving the digestive system and abdomen: Secondary | ICD-10-CM

## 2023-05-16 DIAGNOSIS — K449 Diaphragmatic hernia without obstruction or gangrene: Secondary | ICD-10-CM | POA: Diagnosis not present

## 2023-05-16 DIAGNOSIS — Z87891 Personal history of nicotine dependence: Secondary | ICD-10-CM | POA: Diagnosis not present

## 2023-05-16 DIAGNOSIS — F419 Anxiety disorder, unspecified: Secondary | ICD-10-CM | POA: Insufficient documentation

## 2023-05-16 DIAGNOSIS — R110 Nausea: Secondary | ICD-10-CM | POA: Insufficient documentation

## 2023-05-16 DIAGNOSIS — K581 Irritable bowel syndrome with constipation: Secondary | ICD-10-CM | POA: Diagnosis not present

## 2023-05-16 DIAGNOSIS — K219 Gastro-esophageal reflux disease without esophagitis: Secondary | ICD-10-CM | POA: Insufficient documentation

## 2023-05-16 DIAGNOSIS — F319 Bipolar disorder, unspecified: Secondary | ICD-10-CM | POA: Insufficient documentation

## 2023-05-16 DIAGNOSIS — J4489 Other specified chronic obstructive pulmonary disease: Secondary | ICD-10-CM | POA: Diagnosis not present

## 2023-05-16 DIAGNOSIS — I10 Essential (primary) hypertension: Secondary | ICD-10-CM | POA: Insufficient documentation

## 2023-05-16 DIAGNOSIS — R200 Anesthesia of skin: Secondary | ICD-10-CM | POA: Insufficient documentation

## 2023-05-16 DIAGNOSIS — K319 Disease of stomach and duodenum, unspecified: Secondary | ICD-10-CM | POA: Diagnosis not present

## 2023-05-16 DIAGNOSIS — Z7951 Long term (current) use of inhaled steroids: Secondary | ICD-10-CM | POA: Insufficient documentation

## 2023-05-16 DIAGNOSIS — Z79899 Other long term (current) drug therapy: Secondary | ICD-10-CM | POA: Diagnosis not present

## 2023-05-16 DIAGNOSIS — K259 Gastric ulcer, unspecified as acute or chronic, without hemorrhage or perforation: Secondary | ICD-10-CM | POA: Diagnosis not present

## 2023-05-16 HISTORY — PX: ESOPHAGOGASTRODUODENOSCOPY (EGD) WITH PROPOFOL: SHX5813

## 2023-05-16 HISTORY — PX: BIOPSY: SHX5522

## 2023-05-16 SURGERY — ESOPHAGOGASTRODUODENOSCOPY (EGD) WITH PROPOFOL
Anesthesia: General

## 2023-05-16 MED ORDER — PROPOFOL 500 MG/50ML IV EMUL
INTRAVENOUS | Status: DC | PRN
  Administered 2023-05-16: 100 mg via INTRAVENOUS
  Administered 2023-05-16: 200 ug/kg/min via INTRAVENOUS
  Administered 2023-05-16: 50 mg via INTRAVENOUS

## 2023-05-16 MED ORDER — LACTATED RINGERS IV SOLN: INTRAVENOUS | Status: DC | PRN

## 2023-05-16 MED ORDER — VOQUEZNA 20 MG PO TABS: 20.0000 mg | ORAL_TABLET | Freq: Every day | ORAL | 11 refills | Status: DC

## 2023-05-16 MED ORDER — LIDOCAINE HCL (CARDIAC) PF 100 MG/5ML IV SOSY
PREFILLED_SYRINGE | INTRAVENOUS | Status: DC | PRN
  Administered 2023-05-16: 60 mg via INTRAVENOUS

## 2023-05-16 NOTE — Telephone Encounter (Signed)
-----   Message from Eula Listen sent at 05/16/2023 11:12 AM EDT -----   New prescription for voqenza   20 mg tablet.  1 daily.  Dispense 30 with 11 refills.    She is to discontinue Nexium.  Office visit with Brooke Bonito in 2 months

## 2023-05-16 NOTE — Anesthesia Postprocedure Evaluation (Signed)
Anesthesia Post Note  Patient: Monica Ballard  Procedure(s) Performed: ESOPHAGOGASTRODUODENOSCOPY (EGD) WITH PROPOFOL BIOPSY  Patient location during evaluation: PACU Anesthesia Type: General Level of consciousness: awake and alert Pain management: pain level controlled Vital Signs Assessment: post-procedure vital signs reviewed and stable Respiratory status: spontaneous breathing, nonlabored ventilation, respiratory function stable and patient connected to nasal cannula oxygen Cardiovascular status: blood pressure returned to baseline and stable Postop Assessment: no apparent nausea or vomiting Anesthetic complications: no   There were no known notable events for this encounter.   Last Vitals:  Vitals:   05/16/23 0914 05/16/23 1105  BP: 122/67 (!) 94/53  Pulse: 73 76  Resp: 20 18  Temp: 36.8 C 36.9 C  SpO2: 96% 93%    Last Pain:  Vitals:   05/16/23 1105  TempSrc: Oral  PainSc: 0-No pain                 Simya Tercero L Alf Doyle

## 2023-05-16 NOTE — Anesthesia Preprocedure Evaluation (Addendum)
Anesthesia Evaluation  Patient identified by MRN, date of birth, ID band Patient awake    Reviewed: Allergy & Precautions, NPO status , Patient's Chart, lab work & pertinent test results  History of Anesthesia Complications (+) PONV and history of anesthetic complications  Airway Mallampati: II  TM Distance: >3 FB Neck ROM: Full    Dental  (+) Edentulous Upper, Edentulous Lower   Pulmonary asthma , COPD, former smoker Chronic bronchitis   Pulmonary exam normal breath sounds clear to auscultation       Cardiovascular hypertension, Normal cardiovascular exam Rhythm:Regular Rate:Normal     Neuro/Psych  Headaches PSYCHIATRIC DISORDERS Anxiety Depression Bipolar Disorder      GI/Hepatic Neg liver ROS,GERD  ,,  Endo/Other  negative endocrine ROS    Renal/GU negative Renal ROS  negative genitourinary   Musculoskeletal negative musculoskeletal ROS (+)    Abdominal   Peds negative pediatric ROS (+)  Hematology negative hematology ROS (+)   Anesthesia Other Findings   Reproductive/Obstetrics negative OB ROS                             Anesthesia Physical Anesthesia Plan  ASA: 3  Anesthesia Plan: General   Post-op Pain Management: Minimal or no pain anticipated   Induction: Intravenous  PONV Risk Score and Plan: Propofol infusion  Airway Management Planned: Nasal Cannula and Natural Airway  Additional Equipment: None  Intra-op Plan:   Post-operative Plan: Extubation in OR  Informed Consent: I have reviewed the patients History and Physical, chart, labs and discussed the procedure including the risks, benefits and alternatives for the proposed anesthesia with the patient or authorized representative who has indicated his/her understanding and acceptance.     Dental advisory given  Plan Discussed with: CRNA  Anesthesia Plan Comments:         Anesthesia Quick Evaluation

## 2023-05-16 NOTE — Transfer of Care (Signed)
Immediate Anesthesia Transfer of Care Note  Patient: Monica Ballard  Procedure(s) Performed: ESOPHAGOGASTRODUODENOSCOPY (EGD) WITH PROPOFOL BIOPSY  Patient Location: Endoscopy Unit  Anesthesia Type:General  Level of Consciousness: drowsy  Airway & Oxygen Therapy: Patient Spontanous Breathing  Post-op Assessment: Report given to RN and Post -op Vital signs reviewed and stable  Post vital signs: Reviewed and stable  Last Vitals:  Vitals Value Taken Time  BP 94/53   Temp 36.9 C 05/16/23 1105  Pulse 76 05/16/23 1105  Resp 18 05/16/23 1105  SpO2 93 % 05/16/23 1105    Last Pain:  Vitals:   05/16/23 1105  TempSrc: Oral  PainSc: 0-No pain      Patients Stated Pain Goal: 5 (05/16/23 0904)  Complications: No notable events documented.

## 2023-05-16 NOTE — Telephone Encounter (Signed)
Rx sent to pharmacy on file.   Mandy:arrange follow up with Brooke Bonito NP

## 2023-05-16 NOTE — Op Note (Signed)
[facility] Patient Name: Monica Ballard Procedure Date: 05/16/2023 10:24 AM MRN: [id number] Date of Birth: 1976/06/03 Attending MD: Gennette Pac , MD, [id number] CSN: [id number] Age: 47 Admit Type: Outpatient Procedure:                Upper GI endoscopy Indications:              Heartburn Providers:                Gennette Pac, MD, Buel Ream. Museum/gallery exhibitions officer, Charity fundraiser,                            Judeth Cornfield. Jessee Avers, Technician Referring MD:              Medicines:                Propofol per Anesthesia Complications:            No immediate complications. Estimated Blood Loss:     Estimated blood loss was minimal. Procedure:                Pre-Anesthesia Assessment:                           - Prior to the procedure, a History and Physical                            was performed, and patient medications and                            allergies were reviewed. The patient's tolerance of                            previous anesthesia was also reviewed. The risks                            and benefits of the procedure and the sedation                            options and risks were discussed with the patient.                            All questions were answered, and informed consent                            was obtained. Prior Anticoagulants: The patient has                            taken no anticoagulant or antiplatelet agents. ASA                            Grade Assessment: II - A patient with mild systemic                            disease. After reviewing the risks and benefits,  the patient was deemed in satisfactory condition to                            undergo the procedure.                           After obtaining informed consent, the endoscope was                            passed under direct vision. Throughout the                            procedure, the patient's blood pressure, pulse, and                            oxygen  saturations were monitored continuously. The                            GIF-H190 ([id number]) scope was introduced through the                            mouth, and advanced to the second part of duodenum.                            The upper GI endoscopy was accomplished without                            difficulty. The patient tolerated the procedure                            well. Scope In: 10:55:48 AM Scope Out: 11:00:59 AM Total Procedure Duration: 0 hours 5 minutes 11 seconds  Findings:      The examined esophagus was normal. Gastric cavity empty. Antral erosions       of uncertain significance. No ulcer or infiltrating process. Small       hiatal hernia. Pylorus patent.      The duodenal bulb and second portion of the duodenum were normal.       Biopsies of the abnormal gastric mucosa taken for histologic study Impression:               - Normal esophagus. Small hiatal hernia. Antral                            erosions of uncertain significance?"status post                            biopsy                           - Normal duodenal bulb and second portion of the                            duodenum.                           -Findings  unlikely to explain patient's symptoms.                            She complains of abdominal wall numbness today.                            There may be a functional component. She has failed                            Protonix and Nexium as far as management of her                            reflux is concerned. Ongoing nausea.. Moderate Sedation:      Moderate (conscious) sedation was personally administered by an       anesthesia professional. The following parameters were monitored: oxygen       saturation, heart rate, blood pressure, respiratory rate, EKG, adequacy       of pulmonary ventilation, and response to care. Recommendation:           - Patient has a contact number available for                            emergencies. The signs and  symptoms of potential                            delayed complications were discussed with the                            patient. Return to normal activities tomorrow.                            Written discharge instructions were provided to the                            patient.                           - Advance diet as tolerated.                           - Continue present medications but discontinue                            Nexium; begin Voqenza 20 mg tablet once daily.                            Solid-phase gastric emptying study. Office visit in                            2 months. Follow-up on pathology. Procedure Code(s):        --- Professional ---                           [phone], Esophagogastroduodenoscopy, flexible,  transoral; diagnostic, including collection of                            specimen(s) by brushing or washing, when performed                            (separate procedure) Diagnosis Code(s):        --- Professional ---                           R12, Heartburn CPT copyright 2022 [employer] Association. All rights reserved. The codes documented in this report are preliminary and upon coder review may  be revised to meet current compliance requirements. Gerrit Friends. Rishita Petron, MD Gennette Pac, MD 05/16/2023 11:18:21 AM This report has been signed electronically. Number of Addenda: 0

## 2023-05-16 NOTE — Interval H&P Note (Signed)
History and Physical Interval Note:  05/16/2023 10:49 AM  Monica Ballard  has presented today for surgery, with the diagnosis of N/V,GERD,epigastric pain.  The various methods of treatment have been discussed with the patient and family. After consideration of risks, benefits and other options for treatment, the patient has consented to  Procedure(s) with comments: ESOPHAGOGASTRODUODENOSCOPY (EGD) WITH PROPOFOL (N/A) - 10:15 am, asa 2 as a surgical intervention.  The patient's history has been reviewed, patient examined, no change in status, stable for surgery.  I have reviewed the patient's chart and labs.  Questions were answered to the patient's satisfaction.     Monica Ballard   no change.  Neither Nexium nor Protonix made a big difference with reflux.  Can complaints of numbness first abdominal wall skin today.  Denies dysphagia.  Diagnostic EGD per plan The risks, benefits, limitations, alternatives and imponderables have been reviewed with the patient. Potential for esophageal dilation, biopsy, etc. have also been reviewed.  Questions have been answered. All parties agreeable.

## 2023-05-16 NOTE — Discharge Instructions (Addendum)
EGD Discharge instructions Please read the instructions outlined below and refer to this sheet in the next few weeks. These discharge instructions provide you with general information on caring for yourself after you leave the hospital. Your doctor may also give you specific instructions. While your treatment has been planned according to the most current medical practices available, unavoidable complications occasionally occur. If you have any problems or questions after discharge, please call your doctor. ACTIVITY You may resume your regular activity but move at a slower pace for the next 24 hours.  Take frequent rest periods for the next 24 hours.  Walking will help expel (get rid of) the air and reduce the bloated feeling in your abdomen.  No driving for 24 hours (because of the anesthesia (medicine) used during the test).  You may shower.  Do not sign any important legal documents or operate any machinery for 24 hours (because of the anesthesia used during the test).  NUTRITION Drink plenty of fluids.  You may resume your normal diet.  Begin with a light meal and progress to your normal diet.  Avoid alcoholic beverages for 24 hours or as instructed by your caregiver.  MEDICATIONS You may resume your normal medications unless your caregiver tells you otherwise.  WHAT YOU CAN EXPECT TODAY You may experience abdominal discomfort such as a feeling of fullness or "gas" pains.  FOLLOW-UP Your doctor will discuss the results of your test with you.  SEEK IMMEDIATE MEDICAL ATTENTION IF ANY OF THE FOLLOWING OCCUR: Excessive nausea (feeling sick to your stomach) and/or vomiting.  Severe abdominal pain and distention (swelling).  Trouble swallowing.  Temperature over 101 F (37.8 C).  Rectal bleeding or vomiting of blood.       Your stomach was slightly inflamed.  Biopsies were taken.  These findings prior likely do not explain your symptoms.  Stop Nexium;  Begin Voquezna 20 mg once daily  for acid reflux -  new prescription being called into your pharmacy from the office  Will go ahead and order a solid-phase gastric emptying study to further assess nausea  Office visit in 2 months with Brooke Bonito  At patient request, called Verneita Griffes [phone] to reach.

## 2023-05-20 ENCOUNTER — Other Ambulatory Visit: Payer: Self-pay

## 2023-05-20 ENCOUNTER — Encounter: Payer: Self-pay | Admitting: Internal Medicine

## 2023-05-20 ENCOUNTER — Ambulatory Visit (HOSPITAL_COMMUNITY)
Admission: RE | Admit: 2023-05-20 | Discharge: 2023-05-20 | Disposition: A | Discharge status: Home / Self Care | Payer: No Typology Code available for payment source | Source: Ambulatory Visit | Attending: Surgery | Admitting: Surgery

## 2023-05-20 ENCOUNTER — Ambulatory Visit (INDEPENDENT_AMBULATORY_CARE_PROVIDER_SITE_OTHER): Payer: No Typology Code available for payment source | Admitting: Physician Assistant

## 2023-05-20 VITALS — BP 141/88 | HR 71 | Temp 98.7°F | Ht 67.0 in | Wt 248.0 lb

## 2023-05-20 DIAGNOSIS — G8929 Other chronic pain: Secondary | ICD-10-CM

## 2023-05-20 DIAGNOSIS — I83893 Varicose veins of bilateral lower extremities with other complications: Secondary | ICD-10-CM | POA: Insufficient documentation

## 2023-05-20 DIAGNOSIS — M5441 Lumbago with sciatica, right side: Secondary | ICD-10-CM | POA: Diagnosis not present

## 2023-05-20 DIAGNOSIS — M7989 Other specified soft tissue disorders: Secondary | ICD-10-CM

## 2023-05-20 DIAGNOSIS — M5442 Lumbago with sciatica, left side: Secondary | ICD-10-CM

## 2023-05-20 DIAGNOSIS — I872 Venous insufficiency (chronic) (peripheral): Secondary | ICD-10-CM | POA: Diagnosis not present

## 2023-05-20 LAB — SURGICAL PATHOLOGY

## 2023-05-20 MED ORDER — RABEPRAZOLE SODIUM 20 MG PO TBEC: 20.0000 mg | DELAYED_RELEASE_TABLET | Freq: Every day | ORAL | 11 refills | Status: DC

## 2023-05-20 NOTE — Telephone Encounter (Signed)
Rx was sent to pharmacy on file. Pt was made aware and verbalized understanding.

## 2023-05-20 NOTE — Telephone Encounter (Signed)
Pt's insurance denied Voquezna stating that she must try and fail, pantoprazole, rabeprazole, OTC nexium, OTC Zegrid, OTC prilosec, OTC prevacid. Please advise.

## 2023-05-20 NOTE — Progress Notes (Signed)
Office Note     CC:  follow up Requesting Provider:  Royann Shivers, *  HPI: Kripa Pullium Kube is a 47 y.o. ([birth year]) female who presents for bilateral lower extremity edema as well as pins-and-needles and numbness in her feet.  She describes edema in both legs which has worsened over the course of several years.  She denies any history of DVT, venous ulcerations, trauma, or prior vascular interventions.  She does not wear compression.  She does not elevate her legs periodically throughout the day.  She is unable to take NSAIDs due to history of gastric ulcers.  She is a former smoker who quit in 2013.  Patient also has history of lumbar spine pathology.  She has been seen by an orthopedic in the past who recommended physical therapy however she was unable to pay for the sessions with her insurance.  She describes symptoms of numbness as well as pins-and-needles in her feet and down her legs when laying flat.  She also describes stiffness in her back after sitting for long periods of time which improves after she "gets going."     Past Medical History:  Diagnosis Date   Anemia    Anxiety    Asthma    smoker-uses nebulizer-not used in last year, albuterol rescue inhaler used in 11/13 preoperatively   Bipolar 1 disorder (HCC)    BMI 35.0-35.9,adult    Cervical disc disease    Chest pain    Chronic bronchitis (HCC)    nebulizer and rescue inhaler   Depression    Edema    GERD (gastroesophageal reflux disease)    occasional -tums   Headache(784.0)    Hearing loss in left ear    Hypercholesteremia    Hyperlipidemia    Hypertension    Menopausal symptoms    Missed ab 01/09/2012   10weeks-used cytotec   Numbness and tingling    PONV (postoperative nausea and vomiting)    Preterm labor    Right upper quadrant abdominal pain    SOB (shortness of breath)    Teeth decayed    lower , nothing loose-encouraged pt to seek dental care if necessary    Past Surgical History:   Procedure Laterality Date   ABDOMINAL HYSTERECTOMY     CHOLECYSTECTOMY     CHOLECYSTECTOMY, LAPAROSCOPIC  12/06/2010   COLONOSCOPY     DILATION AND CURETTAGE OF UTERUS  06/11/2012   Procedure: DILATATION AND CURETTAGE;  Surgeon: Geryl Rankins, MD;  Location: [facility];  Service: Gynecology;  Laterality: N/A;  sec procedure start  1241   DILATION AND EVACUATION  01/16/2012   Procedure: DILATATION AND EVACUATION;  Surgeon: Geryl Rankins, MD;  Location: [facility];  Service: Gynecology;  Laterality: N/A;   LAPAROSCOPY  06/11/2012   Procedure: LAPAROSCOPY OPERATIVE;  Surgeon: Geryl Rankins, MD;  Location: [facility];  Service: Gynecology;  Laterality: N/A;   left ear drum rebuilt     x2   TYMPANOPLASTY Left 04/23/2016   Procedure: TYMPANOPLASTY LEFT EAR;  Surgeon: Serena Colonel, MD;  Location: [facility];  Service: ENT;  Laterality: Left;   VAGINAL HYSTERECTOMY N/A 09/01/2012   Procedure: HYSTERECTOMY VAGINAL;  Surgeon: Geryl Rankins, MD;  Location: [facility];  Service: Gynecology;  Laterality: N/A;    Social History   Socioeconomic History   Marital status: Married    Spouse name: Not on file   Number of children: 2   Years of education: GED   Highest education level: Not on  file  Occupational History   Occupation: Housewife  Tobacco Use   Smoking status: Former    Current packs/day: 0.25    Average packs/day: 0.3 packs/day for 8.0 years (2.0 ttl pk-yrs)    Types: Cigarettes   Smokeless tobacco: Never   Tobacco comments:    up to 1/2 ppl x 8 years  Vaping Use   Vaping status: Never Used  Substance and Sexual Activity   Alcohol use: No   Drug use: No   Sexual activity: Yes    Birth control/protection: Surgical  Other Topics Concern   Not on file  Social History Narrative   Lives with daughter, son and husband in a one story home.  Has 2 children.  Does not work at this time.  Education: GED.    Right-handed.   4-5 twelve ounce cans per day.   Social Determinants of  Health   Financial Resource Strain: Not on file  Food Insecurity: Not on file  Transportation Needs: Not on file  Physical Activity: Not on file  Stress: Not on file  Social Connections: Not on file  Intimate Partner Violence: Not on file    Family History  Problem Relation Age of Onset   Cancer Other        family hx   Diabetes Other        family hx   Depression Other        family hx    Coronary artery disease Mother        also MVD   Hyperlipidemia Mother    Hypertension Mother    Hypertension Father    Diabetes Father    Diabetes Maternal Grandmother    Colon cancer Paternal Grandmother    Breast cancer Maternal Aunt    Anesthesia problems Neg Hx     Current Outpatient Medications  Medication Sig Dispense Refill   budesonide-formoterol (SYMBICORT) 160-4.5 MCG/ACT inhaler Inhale 2 puffs into the lungs 2 (two) times daily.     buPROPion (WELLBUTRIN XL) 150 MG 24 hr tablet Take 1 tablet (150 mg total) by mouth every morning. 30 tablet 1   Calcium-Magnesium-Vitamin D 200-100-33.3 MG-MG-UNIT CAPS Take by mouth.     cariprazine (VRAYLAR) 3 MG capsule Take 3 mg by mouth at bedtime.     cholecalciferol (VITAMIN D3) 25 MCG (1000 UNIT) tablet Take 1,000 Units by mouth daily.     clindamycin (CLEOCIN T) 1 % lotion Apply topically.     cyanocobalamin 1000 MCG tablet Take 1,000 mcg by mouth daily. (Patient not taking: Reported on 01/21/2023)     diazepam (VALIUM) 5 MG tablet Take 5 mg by mouth 2 (two) times daily as needed.     doxepin (SINEQUAN) 10 MG capsule Take 10 mg by mouth at bedtime.     famotidine (PEPCID) 20 MG tablet TAKE ONE TABLET BY MOUTH AT BEDTIME 30 tablet 1   furosemide (LASIX) 20 MG tablet Take 20 mg by mouth daily as needed.     lamoTRIgine (LAMICTAL) 200 MG tablet Take 1 tablet (200 mg total) by mouth at bedtime. 30 tablet 1   lubiprostone (AMITIZA) 8 MCG capsule Take 1 capsule (8 mcg total) by mouth 2 (two) times daily with a meal. 60 capsule 2    methocarbamol (ROBAXIN) 500 MG tablet TAKE ONE TABLET BY MOUTH EVERY 6-8 HOURS FOR 10 DAYS AS NEEDED     Multiple Vitamin (MULTIVITAMIN WITH MINERALS) TABS Take 1 tablet by mouth daily.     NEURONTIN 300 MG  capsule Not taking per pt     NURTEC 75 MG TBDP Take 1 tablet by mouth as directed.     promethazine (PHENERGAN) 25 MG tablet TAKE ONE TABLET BY MOUTH EVERY 6 HOURS AS NEEDED FOR NAUSEA AND VOMITING 120 tablet 1   sertraline (ZOLOFT) 100 MG tablet Take 2 tablets (200 mg total) by mouth daily. 60 tablet 1   traZODone (DESYREL) 150 MG tablet Take 100 mg by mouth at bedtime. Not taking per pt     Vonoprazan Fumarate (VOQUEZNA) 20 MG TABS Take 20 mg by mouth daily. 30 tablet 11   No current facility-administered medications for this visit.    Allergies  Allergen Reactions   Penicillins Hives   Statins Swelling   Tape Rash     REVIEW OF SYSTEMS:   [X]  denotes positive finding, [ ]  denotes negative finding Cardiac  Comments:  Chest pain or chest pressure:    Shortness of breath upon exertion:    Short of breath when lying flat:    Irregular heart rhythm:        Vascular    Pain in calf, thigh, or hip brought on by ambulation:    Pain in feet at night that wakes you up from your sleep:     Blood clot in your veins:    Leg swelling:         Pulmonary    Oxygen at home:    Productive cough:     Wheezing:         Neurologic    Sudden weakness in arms or legs:     Sudden numbness in arms or legs:     Sudden onset of difficulty speaking or slurred speech:    Temporary loss of vision in one eye:     Problems with dizziness:         Gastrointestinal    Blood in stool:     Vomited blood:         Genitourinary    Burning when urinating:     Blood in urine:        Psychiatric    Major depression:         Hematologic    Bleeding problems:    Problems with blood clotting too easily:        Skin    Rashes or ulcers:        Constitutional    Fever or chills:       PHYSICAL EXAMINATION:  Vitals:   05/20/23 1415  BP: (!) 141/88  Pulse: 71  Temp: 98.7 F (37.1 C)  SpO2: 95%  Weight: 248 lb (112.5 kg)  Height: 5\' 7"  (1.702 m)    General:  WDWN in NAD; vital signs documented above Gait: Not observed HENT: WNL, normocephalic Pulmonary: normal non-labored breathing , without Rales, rhonchi,  wheezing Cardiac: regular HR Abdomen: soft, NT, no masses Skin: without rashes Vascular Exam/Pulses: Palpable and symmetrical DP pulses Extremities: without ischemic changes, without Gangrene , without cellulitis; without open wounds; no obvious varicose veins; no pigmentation changes; no ulcerations; no significant edema right or left leg on exam today Musculoskeletal: no muscle wasting or atrophy  Neurologic: A&O X 3 Psychiatric:  The pt has Normal affect.   Non-Invasive Vascular Imaging:   Right lower extremity venous reflux study negative for DVT Negative for deep venous reflux Incompetent GSV throughout the thigh     ASSESSMENT/PLAN:: 47 y.o. female here for evaluation of bilateral lower extremity edema as well  as symptoms of burning, numbness, pins-and-needles  Ms. Myleigha Rausch is a 47 year old female here for evaluation of bilateral lower extremity edema.  Right lower extremity venous reflux study is negative for DVT.  Study is also negative for deep venous reflux.  She has an incompetent GSV throughout the thigh however she is without significant edema, pigmentation changes, or varicosities on exam.  She has also not tried any conservative measures in the past.  Recommendations include regular use of 15 to 20 mmHg compression socks, proper leg elevation for 10 to 15 minutes at a time periodically throughout the day, and avoiding prolonged sitting and standing.  If she finds that these conservative measures do not help her symptoms she can return to office in the future for further workup towards saphenous vein ablation.  I would not recommend  repeating the study for at least a calendar year.  We also discussed symptoms of burning and pins-and-needles feeling in her feet especially when laying flat.  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  I recommend patient return to her PCP for further workup if the symptoms are affecting her quality of life.  She will follow-up on an as-needed basis.   Emilie Rutter, PA-C [facility] [phone]  Clinic MD:   Myra Gianotti

## 2023-05-23 ENCOUNTER — Encounter (HOSPITAL_COMMUNITY): Payer: Self-pay | Admitting: Internal Medicine

## 2023-06-09 ENCOUNTER — Other Ambulatory Visit: Payer: Self-pay | Admitting: Gastroenterology

## 2023-06-26 ENCOUNTER — Ambulatory Visit: Payer: No Typology Code available for payment source | Admitting: Orthopaedic Surgery

## 2023-06-26 ENCOUNTER — Encounter: Payer: Self-pay | Admitting: Orthopaedic Surgery

## 2023-06-26 DIAGNOSIS — G8929 Other chronic pain: Secondary | ICD-10-CM | POA: Diagnosis not present

## 2023-06-26 DIAGNOSIS — M25561 Pain in right knee: Secondary | ICD-10-CM | POA: Diagnosis not present

## 2023-06-26 MED ORDER — METHYLPREDNISOLONE ACETATE 40 MG/ML IJ SUSP
40.0000 mg | Freq: Once | INTRAMUSCULAR | Status: AC
  Administered 2023-06-26: 40 mg via INTRA_ARTICULAR

## 2023-06-26 MED ORDER — HYDROCODONE-ACETAMINOPHEN 5-325 MG PO TABS: 1.0000 | ORAL_TABLET | ORAL | 0 refills | Status: DC | PRN

## 2023-06-26 NOTE — Progress Notes (Signed)
Subjective:    Patient ID: Monica Ballard, female    DOB: 04/10/1976, 47 y.o.   MRN: [id number]  HPI She has had pain in the right knee since Thanksgiving Day when she was squatting to get extra pots.  She has pain medially in the right knee.  She has popping and swelling and feeling it may give way.  She has had right knee pain over the last year, but not this type of pain.  She has no trauma, no redness. She went to Urgent Care and was seen and had X-rays.  She was given prednisone which has helped only slightly.  I have reviewed the X-rays from the Urgent Care on a CD.  She has GERD and cannot take NSAIDs.  Review of Systems  Constitutional:  Positive for activity change.  Respiratory:  Positive for shortness of breath.   Cardiovascular:  Positive for chest pain.  Musculoskeletal:  Positive for arthralgias, gait problem and joint swelling.  All other systems reviewed and are negative. For Review of Systems, all other systems reviewed and are negative.  The following is a summary of the past history medically, past history surgically, known current medicines, social history and family history.  This information is gathered electronically by the computer from prior information and documentation.  I review this each visit and have found including this information at this point in the chart is beneficial and informative.   Past Medical History:  Diagnosis Date   Anemia    Anxiety    Asthma    smoker-uses nebulizer-not used in last year, albuterol rescue inhaler used in 11/13 preoperatively   Bipolar 1 disorder (HCC)    BMI 35.0-35.9,adult    Cervical disc disease    Chest pain    Chronic bronchitis (HCC)    nebulizer and rescue inhaler   Depression    Edema    GERD (gastroesophageal reflux disease)    occasional -tums   Headache(784.0)    Hearing loss in left ear    Hypercholesteremia    Hyperlipidemia    Hypertension    Menopausal symptoms    Missed ab 01/09/2012    10weeks-used cytotec   Numbness and tingling    PONV (postoperative nausea and vomiting)    Preterm labor    Right upper quadrant abdominal pain    SOB (shortness of breath)    Teeth decayed    lower , nothing loose-encouraged pt to seek dental care if necessary    Past Surgical History:  Procedure Laterality Date   ABDOMINAL HYSTERECTOMY     BIOPSY  05/16/2023   Procedure: BIOPSY;  Surgeon: Corbin Ade, MD;  Location: [facility];  Service: Endoscopy;;   CHOLECYSTECTOMY     CHOLECYSTECTOMY, LAPAROSCOPIC  12/06/2010   COLONOSCOPY     DILATION AND CURETTAGE OF UTERUS  06/11/2012   Procedure: DILATATION AND CURETTAGE;  Surgeon: Geryl Rankins, MD;  Location: [facility];  Service: Gynecology;  Laterality: N/A;  sec procedure start  1241   DILATION AND EVACUATION  01/16/2012   Procedure: DILATATION AND EVACUATION;  Surgeon: Geryl Rankins, MD;  Location: [facility];  Service: Gynecology;  Laterality: N/A;   ESOPHAGOGASTRODUODENOSCOPY (EGD) WITH PROPOFOL N/A 05/16/2023   Procedure: ESOPHAGOGASTRODUODENOSCOPY (EGD) WITH PROPOFOL;  Surgeon: Corbin Ade, MD;  Location: [facility];  Service: Endoscopy;  Laterality: N/A;  10:15 am, asa 2   LAPAROSCOPY  06/11/2012   Procedure: LAPAROSCOPY OPERATIVE;  Surgeon: Geryl Rankins, MD;  Location: [facility];  Service: Gynecology;  Laterality: N/A;   left ear drum rebuilt     x2   TYMPANOPLASTY Left 04/23/2016   Procedure: TYMPANOPLASTY LEFT EAR;  Surgeon: Serena Colonel, MD;  Location: [facility];  Service: ENT;  Laterality: Left;   VAGINAL HYSTERECTOMY N/A 09/01/2012   Procedure: HYSTERECTOMY VAGINAL;  Surgeon: Geryl Rankins, MD;  Location: [facility];  Service: Gynecology;  Laterality: N/A;    Current Outpatient Medications on File Prior to Visit  Medication Sig Dispense Refill   budesonide-formoterol (SYMBICORT) 160-4.5 MCG/ACT inhaler Inhale 2 puffs into the lungs 2 (two) times daily.     Calcium-Magnesium-Vitamin D 200-100-33.3  MG-MG-UNIT CAPS Take by mouth.     cariprazine (VRAYLAR) 3 MG capsule Take 3 mg by mouth at bedtime.     cholecalciferol (VITAMIN D3) 25 MCG (1000 UNIT) tablet Take 1,000 Units by mouth daily.     clindamycin (CLEOCIN T) 1 % lotion Apply topically.     cyanocobalamin 1000 MCG tablet Take 1,000 mcg by mouth daily.     diazepam (VALIUM) 5 MG tablet Take 5 mg by mouth 2 (two) times daily as needed.     doxepin (SINEQUAN) 10 MG capsule Take 10 mg by mouth at bedtime.     famotidine (PEPCID) 20 MG tablet TAKE ONE TABLET BY MOUTH AT BEDTIME 30 tablet 5   furosemide (LASIX) 20 MG tablet Take 20 mg by mouth daily as needed.     lamoTRIgine (LAMICTAL) 200 MG tablet Take 1 tablet (200 mg total) by mouth at bedtime. 30 tablet 1   lubiprostone (AMITIZA) 8 MCG capsule Take 1 capsule (8 mcg total) by mouth 2 (two) times daily with a meal. 60 capsule 2   methocarbamol (ROBAXIN) 500 MG tablet TAKE ONE TABLET BY MOUTH EVERY 6-8 HOURS FOR 10 DAYS AS NEEDED     Multiple Vitamin (MULTIVITAMIN WITH MINERALS) TABS Take 1 tablet by mouth daily.     NURTEC 75 MG TBDP Take 1 tablet by mouth as directed.     promethazine (PHENERGAN) 25 MG tablet TAKE ONE TABLET BY MOUTH EVERY 6 HOURS AS NEEDED FOR NAUSEA AND VOMITING 120 tablet 1   RABEprazole (ACIPHEX) 20 MG tablet Take 1 tablet (20 mg total) by mouth daily. 30 tablet 11   buPROPion (WELLBUTRIN XL) 150 MG 24 hr tablet Take 1 tablet (150 mg total) by mouth every morning. 30 tablet 1   NEURONTIN 300 MG capsule Not taking per pt     sertraline (ZOLOFT) 100 MG tablet Take 2 tablets (200 mg total) by mouth daily. 60 tablet 1   traZODone (DESYREL) 150 MG tablet Take 100 mg by mouth at bedtime. Not taking per pt     No current facility-administered medications on file prior to visit.    Social History   Socioeconomic History   Marital status: Married    Spouse name: Not on file   Number of children: 2   Years of education: GED   Highest education level: Not on file   Occupational History   Occupation: Housewife  Tobacco Use   Smoking status: Former    Current packs/day: 0.25    Average packs/day: 0.3 packs/day for 8.0 years (2.0 ttl pk-yrs)    Types: Cigarettes   Smokeless tobacco: Never   Tobacco comments:    up to 1/2 ppl x 8 years  Vaping Use   Vaping status: Never Used  Substance and Sexual Activity   Alcohol use: No   Drug use: No  Sexual activity: Yes    Birth control/protection: Surgical  Other Topics Concern   Not on file  Social History Narrative   Lives with daughter, son and husband in a one story home.  Has 2 children.  Does not work at this time.  Education: GED.    Right-handed.   4-5 twelve ounce cans per day.   Social Determinants of Health   Financial Resource Strain: Not on file  Food Insecurity: Not on file  Transportation Needs: Not on file  Physical Activity: Not on file  Stress: Not on file  Social Connections: Not on file  Intimate Partner Violence: Not on file    Family History  Problem Relation Age of Onset   Cancer Other        family hx   Diabetes Other        family hx   Depression Other        family hx    Coronary artery disease Mother        also MVD   Hyperlipidemia Mother    Hypertension Mother    Hypertension Father    Diabetes Father    Diabetes Maternal Grandmother    Colon cancer Paternal Grandmother    Breast cancer Maternal Aunt    Anesthesia problems Neg Hx     LMP 08/25/2012   There is no height or weight on file to calculate BMI.      Objective:   Physical Exam Vitals and nursing note reviewed. Exam conducted with a chaperone present.  Musculoskeletal:       Legs:           Assessment & Plan:   Encounter Diagnosis  Name Primary?   Chronic pain of right knee Yes   I will get MRI of the right knee.  I am concerned about medial meniscus tear.  PROCEDURE NOTE:  The patient requests injections of the right knee , verbal consent was obtained.  The right  knee was prepped appropriately after time out was performed.   Sterile technique was observed and injection of 1 cc of DepoMedrol 40mg  with several cc's of plain xylocaine. Anesthesia was provided by ethyl chloride and a 20-gauge needle was used to inject the knee area. The injection was tolerated well.  A band aid dressing was applied.  The patient was advised to apply ice later today and tomorrow to the injection sight as needed.  I have reviewed the [place] Controlled Substance Reporting System web site prior to prescribing narcotic medicine for this patient.  Return in two weeks.  She will need OPEN MRI.  Call if any problem.  Precautions discussed.  Electronically Signed Darreld Mclean, MD 12/4/202410:10 AM

## 2023-06-26 NOTE — Patient Instructions (Signed)
Central Scheduling [phone]

## 2023-07-02 ENCOUNTER — Telehealth: Payer: Self-pay | Admitting: Orthopaedic Surgery

## 2023-07-04 ENCOUNTER — Other Ambulatory Visit: Payer: Self-pay | Admitting: Gastroenterology

## 2023-07-04 DIAGNOSIS — R110 Nausea: Secondary | ICD-10-CM

## 2023-07-06 ENCOUNTER — Ambulatory Visit (HOSPITAL_COMMUNITY)
Admission: RE | Admit: 2023-07-06 | Discharge: 2023-07-06 | Disposition: A | Discharge status: Home / Self Care | Payer: No Typology Code available for payment source | Source: Ambulatory Visit | Attending: Orthopaedic Surgery | Admitting: Orthopaedic Surgery

## 2023-07-06 DIAGNOSIS — M25561 Pain in right knee: Secondary | ICD-10-CM | POA: Diagnosis present

## 2023-07-06 DIAGNOSIS — G8929 Other chronic pain: Secondary | ICD-10-CM | POA: Diagnosis present

## 2023-07-10 ENCOUNTER — Ambulatory Visit: Payer: No Typology Code available for payment source | Admitting: Orthopaedic Surgery

## 2023-07-10 ENCOUNTER — Encounter: Payer: Self-pay | Admitting: Orthopaedic Surgery

## 2023-07-10 VITALS — Ht 67.0 in | Wt 262.0 lb

## 2023-07-10 DIAGNOSIS — G8929 Other chronic pain: Secondary | ICD-10-CM | POA: Diagnosis not present

## 2023-07-10 DIAGNOSIS — M25561 Pain in right knee: Secondary | ICD-10-CM | POA: Diagnosis not present

## 2023-07-10 MED ORDER — HYDROCODONE-ACETAMINOPHEN 5-325 MG PO TABS: 1.0000 | ORAL_TABLET | Freq: Four times a day (QID) | ORAL | 0 refills | Status: DC | PRN

## 2023-07-10 NOTE — Progress Notes (Signed)
My knee is still hurting.  She had the MRI of the right knee showing: IMPRESSION: 1. No internal derangement. 2. Mild focal cartilage irregularity over the patellar apex. 3. Small joint effusion.  I have explained the findings to her.  No surgery is needed. I have independently reviewed the MRI.    She has good motion of the right knee and has effusion and crepitus.  NV intact.  She has no distal edema.  Gait is a limp to the right.  Encounter Diagnosis  Name Primary?   Chronic pain of right knee Yes   I have reviewed the [place] Controlled Substance Reporting System web site prior to prescribing narcotic medicine for this patient.  I will renew pain medicine.  She cannot take NSAIDs.  Return in five weeks.  Call if any problem.  Precautions discussed.  Electronically Signed Darreld Mclean, MD 12/18/20242:22 PM

## 2023-07-11 ENCOUNTER — Ambulatory Visit: Payer: No Typology Code available for payment source | Admitting: Gastroenterology

## 2023-07-11 ENCOUNTER — Telehealth: Payer: Self-pay | Admitting: *Deleted

## 2023-07-11 ENCOUNTER — Encounter: Payer: Self-pay | Admitting: Gastroenterology

## 2023-07-11 ENCOUNTER — Other Ambulatory Visit: Payer: Self-pay

## 2023-07-11 VITALS — BP 137/84 | HR 88 | Temp 97.8°F | Ht 67.0 in | Wt 264.2 lb

## 2023-07-11 DIAGNOSIS — K581 Irritable bowel syndrome with constipation: Secondary | ICD-10-CM

## 2023-07-11 DIAGNOSIS — R1012 Left upper quadrant pain: Secondary | ICD-10-CM | POA: Diagnosis not present

## 2023-07-11 DIAGNOSIS — Z800 Family history of malignant neoplasm of digestive organs: Secondary | ICD-10-CM

## 2023-07-11 DIAGNOSIS — K219 Gastro-esophageal reflux disease without esophagitis: Secondary | ICD-10-CM | POA: Diagnosis not present

## 2023-07-11 DIAGNOSIS — R110 Nausea: Secondary | ICD-10-CM

## 2023-07-11 DIAGNOSIS — R140 Abdominal distension (gaseous): Secondary | ICD-10-CM

## 2023-07-11 DIAGNOSIS — R109 Unspecified abdominal pain: Secondary | ICD-10-CM

## 2023-07-11 DIAGNOSIS — R112 Nausea with vomiting, unspecified: Secondary | ICD-10-CM

## 2023-07-11 MED ORDER — RABEPRAZOLE SODIUM 20 MG PO TBEC
20.0000 mg | DELAYED_RELEASE_TABLET | Freq: Two times a day (BID) | ORAL | 11 refills | Status: DC
Start: 2023-07-11 — End: 2023-07-11

## 2023-07-11 MED ORDER — RABEPRAZOLE SODIUM 20 MG PO TBEC: 20.0000 mg | DELAYED_RELEASE_TABLET | Freq: Two times a day (BID) | ORAL | 11 refills | Status: DC

## 2023-07-11 NOTE — Telephone Encounter (Signed)
[facility]  GES scheduled for Thursday 07/18/23, arrive at 7:45 am to check in, nothing to eat or drink after midnight and no stomach medications morning of procedure. Expect to be there about 4 hours    Called [facility] about PA: No prior authorization is needed. Ref # Melissa [id number] 07/11/23 1:48 pm

## 2023-07-11 NOTE — Progress Notes (Signed)
GI Office Note    Referring Provider: Donetta Potts, MD Primary Care Physician:  Monica Ballard Primary Gastroenterologist: Monica Friends.Rourk, MD  Date:  07/11/2023  ID:  Monica Ballard, DOB [birth year], MRN [id number]   Chief Complaint   Chief Complaint  Patient presents with   Follow-up    Follow up. No problems    History of Present Illness  Monica Ballard is a 47 y.o. female with a history of IBS, anemia, anxiety/depression, asthma, GERD, HTN, HLD, chronic nausea, and history of cholecystectomy presenting today for follow-up of chronic GI issues including bloating, constipation, nausea, and reflux.  OV 11/26/22.  Still with chronic nausea also with tenderness on the left rib cage that feels numb at times.  Also with some right upper quadrant cramping at times.  Has to apply heat once medication kicks in and she burps and passes gas and is still some pain usually occurs after eating and then at times feels like she has to go but is unable to go.  Was having Bristol 1 stools and not going for up to 10 days at a time and is in her stool.  Has been trying a probiotic yogurt on a daily basis.  No relief with MiraLAX in the past or with Benefiber or Metamucil.  Been on pantoprazole 40 mg once daily for years and recently increased to twice daily and felt like it was possibly working but wakes up with regurgitation.  Taking Phenergan regularly.  Denies any NSAID use. Linzess provided. Stop pantoprazole and start nexium. Famotidine nightly as neded. Phenergan refilled to use as needed. Gas ex as needed. Dicyclomine as needed for abdominal pain. Probiotic yogurt. Request prior colonoscopy records and labs. Linzess caused significant diarrhea even at low dose so advised to stop and then start fiber supplementation.    Labs April 2024: WBC 11, hemoglobin 14, alk phos 124, CMP otherwise unremarkable.  Lipid panel with LDL.  TSH normal.  B12 and folate normal.   OV 01/21/23. Taking metamucil  nightly for a week, linzess to strong. Sometimes small balls and hard. Still having bloating, especially in RUQ. Still with intermittent nausea. Taking phenergan 2-3 times daily. Slight improvement with nexium. Taking dicyclomine as needed. Advised lactaid, sucraid testing, celiac and alpha gal labs. Continue nexium 40 BID, pepcid as needed. Add stool softener, continue metamucil, phenergan. Consider EGD and/or GES. Advised 3 months f/u.   Labs 01/21/23: Celiac and alpha gal negative.    Sucrase breath test negative.  Last office visit 04/22/23.  Taking MiraLAX twice daily with 1 capful.  Still not having consistent bowel movements but stools have somewhat softened.  Having Bristol 1 followed by [facility] 5 stools at times.  Not having much abdominal pain, pain usually located in her back.  Continuing to have lots of bloating but is better in the mornings and gets worse throughout the day.  Reported continued weight gain but had recently started some new medications, going through menopause.  Constant thirst.  Has been having back pain with numbness and tingling radiating down her legs and hands.  Sometimes pain wakes her up.  GERD and nausea about the same, continuing Nexium twice daily and taking Pepcid nightly.  Also having to take Gas-X.  Sometimes indigestion wakes her up but she may puke.  Had been on Protonix in the past, using Phenergan 3 times per day.  Having more flatulence and burping.  Advise trial of Amitiza 8 mcg daily with food and  increase to twice daily if no improvement.  Schedule EGD.  Avoid lactose and Lactaid.  Continue PPI twice daily and Phenergan as needed.  Trial of Phazyme and continue Pepcid nightly.  Consider GES if ongoing nausea despite control of constipation.  [facility] office notes and EGD report reviewed in [place].  No colonoscopy report received.  Appears there was plans to potentially repeat a colonoscopy given patient's family history of colon cancer in her  great-grandmother and grandmother on her father side.  Reportedly awaiting for change in insurance for plan for colonoscopy in January 2020.  Does not appear that this was performed. Would recommend that given unable to find colonoscopy report and given her family history if she did not in fact have a colonoscopy in January 2020 then given her age I would recommend a colonoscopy for screening purposes despite a normal colonoscopy in 2003 - planned to discuss this at OV follow up.   EGD 05/16/23: -Normal esophagus -Small hiatal hernia -Antral erosions of uncertain significance s/p biopsy -Normal duodenum -Patient complained of abdominal wall numbness (Dr. Jena Gauss felt functional component) -Dr. Jena Gauss advised trial of Voquezna 20mg  once daily and discontinue nexium.  -Recommended gastric emptying study  Insurance denied Voquezna as a stated she most tried and failed pantoprazole, rabeprazole, over-the-counter Nexium, over-the-counter Zegerid, over-the-counter Prilosec, and over-the-counter Prevacid.  Decision was made to place her on Rabeprazole 20 mg daily, 30 minutes prior to breakfast.  Today:  Weight gain, no sleep. Not taking Abilify. Not sleeping.  Concerned about her weight gain.  Still feeling bloated. Constipation doing much better on the Amitiza 8 mcg BID. Having more bristol 4 stools with this. Its s relief when she is able to go.   Taking pepcid twice daily and still has some symptoms and comes up. Taking rabeprazole 20 mg once daily in the mornings. Symptoms worse in the evenings. Sleeps propped up in the evenings with multiple pillows.   Family hx of colon cancer in 16's -36s.   Wt Readings from Last 3 Encounters:  07/11/23 264 lb 3.2 oz (119.8 kg)  07/10/23 262 lb (118.8 kg)  05/20/23 248 lb (112.5 kg)    Current Outpatient Medications  Medication Sig Dispense Refill   ARIPiprazole (ABILIFY) 2 MG tablet Take 2 mg by mouth daily.     budesonide-formoterol (SYMBICORT) 160-4.5  MCG/ACT inhaler Inhale 2 puffs into the lungs 2 (two) times daily.     buPROPion (WELLBUTRIN XL) 150 MG 24 hr tablet Take 1 tablet (150 mg total) by mouth every morning. 30 tablet 1   Calcium-Magnesium-Vitamin D 200-100-33.3 MG-MG-UNIT CAPS Take by mouth.     cariprazine (VRAYLAR) 3 MG capsule Take 3 mg by mouth at bedtime.     cholecalciferol (VITAMIN D3) 25 MCG (1000 UNIT) tablet Take 1,000 Units by mouth daily.     clindamycin (CLEOCIN T) 1 % lotion Apply topically.     cyanocobalamin 1000 MCG tablet Take 1,000 mcg by mouth daily.     diazepam (VALIUM) 5 MG tablet Take 5 mg by mouth 2 (two) times daily as needed.     doxepin (SINEQUAN) 10 MG capsule Take 10 mg by mouth at bedtime.     doxycycline (VIBRA-TABS) 100 MG tablet Take 100 mg by mouth 2 (two) times daily.     famotidine (PEPCID) 20 MG tablet TAKE ONE TABLET BY MOUTH AT BEDTIME 30 tablet 5   fluticasone (FLONASE) 50 MCG/ACT nasal spray Place 2 sprays into both nostrils daily.  furosemide (LASIX) 20 MG tablet Take 20 mg by mouth daily as needed.     HYDROcodone-acetaminophen (NORCO/VICODIN) 5-325 MG tablet Take 1 tablet by mouth every 6 (six) hours as needed for moderate pain (pain score 4-6). 28 tablet 0   lamoTRIgine (LAMICTAL) 200 MG tablet Take 1 tablet (200 mg total) by mouth at bedtime. 30 tablet 1   lubiprostone (AMITIZA) 8 MCG capsule TAKE ONE CAPSULE BY MOUTH TWICE DAILY WITH MEALS 60 capsule 2   Multiple Vitamin (MULTIVITAMIN WITH MINERALS) TABS Take 1 tablet by mouth daily.     NURTEC 75 MG TBDP Take 1 tablet by mouth as directed.     promethazine (PHENERGAN) 25 MG tablet TAKE ONE TABLET BY MOUTH EVERY 6 HOURS AS NEEDED FOR NAUSEA AND VOMITING 120 tablet 1   sertraline (ZOLOFT) 100 MG tablet Take 2 tablets (200 mg total) by mouth daily. 60 tablet 1   traZODone (DESYREL) 100 MG tablet Take 150-200 mg by mouth daily.     methocarbamol (ROBAXIN) 500 MG tablet TAKE ONE TABLET BY MOUTH EVERY 6-8 HOURS FOR 10 DAYS AS NEEDED  (Patient not taking: Reported on 07/11/2023)     RABEprazole (ACIPHEX) 20 MG tablet Take 1 tablet (20 mg total) by mouth 2 (two) times daily before a meal. 60 tablet 11   No current facility-administered medications for this visit.    Past Medical History:  Diagnosis Date   Anemia    Anxiety    Asthma    smoker-uses nebulizer-not used in last year, albuterol rescue inhaler used in 11/13 preoperatively   Bipolar 1 disorder (HCC)    BMI 35.0-35.9,adult    Cervical disc disease    Chest pain    Chronic bronchitis (HCC)    nebulizer and rescue inhaler   Depression    Edema    GERD (gastroesophageal reflux disease)    occasional -tums   Headache(784.0)    Hearing loss in left ear    Hypercholesteremia    Hyperlipidemia    Hypertension    Menopausal symptoms    Missed ab 01/09/2012   10weeks-used cytotec   Numbness and tingling    PONV (postoperative nausea and vomiting)    Preterm labor    Right upper quadrant abdominal pain    SOB (shortness of breath)    Teeth decayed    lower , nothing loose-encouraged pt to seek dental care if necessary    Past Surgical History:  Procedure Laterality Date   ABDOMINAL HYSTERECTOMY     BIOPSY  05/16/2023   Procedure: BIOPSY;  Surgeon: Corbin Ade, MD;  [facility]: [facility];  Service: Endoscopy;;   CHOLECYSTECTOMY     CHOLECYSTECTOMY, LAPAROSCOPIC  12/06/2010   COLONOSCOPY     DILATION AND CURETTAGE OF UTERUS  06/11/2012   Procedure: DILATATION AND CURETTAGE;  Surgeon: Geryl Rankins, MD;  Location: [facility];  Service: Gynecology;  Laterality: N/A;  sec procedure start  1241   DILATION AND EVACUATION  01/16/2012   Procedure: DILATATION AND EVACUATION;  Surgeon: Geryl Rankins, MD;  Location: [facility];  Service: Gynecology;  Laterality: N/A;   ESOPHAGOGASTRODUODENOSCOPY (EGD) WITH PROPOFOL N/A 05/16/2023   Procedure: ESOPHAGOGASTRODUODENOSCOPY (EGD) WITH PROPOFOL;  Surgeon: Corbin Ade, MD;  Location: [facility];  Service:  Endoscopy;  Laterality: N/A;  10:15 am, asa 2   LAPAROSCOPY  06/11/2012   Procedure: LAPAROSCOPY OPERATIVE;  Surgeon: Geryl Rankins, MD;  Location: [facility];  Service: Gynecology;  Laterality: N/A;   left ear drum  rebuilt     x2   TYMPANOPLASTY Left 04/23/2016   Procedure: TYMPANOPLASTY LEFT EAR;  Surgeon: Serena Colonel, MD;  Location: [facility];  Service: ENT;  Laterality: Left;   VAGINAL HYSTERECTOMY N/A 09/01/2012   Procedure: HYSTERECTOMY VAGINAL;  Surgeon: Geryl Rankins, MD;  Location: [facility];  Service: Gynecology;  Laterality: N/A;    Family History  Problem Relation Age of Onset   Cancer Other        family hx   Diabetes Other        family hx   Depression Other        family hx    Coronary artery disease Mother        also MVD   Hyperlipidemia Mother    Hypertension Mother    Hypertension Father    Diabetes Father    Diabetes Maternal Grandmother    Colon cancer Paternal Grandmother    Breast cancer Maternal Aunt    Anesthesia problems Neg Hx     Allergies as of 07/11/2023 - Review Complete 07/11/2023  Allergen Reaction Noted   Penicillins Hives 01/25/2010   Statins Swelling 05/16/2023   Tape Rash 05/16/2023    Social History   Socioeconomic History   Marital status: Married    Spouse name: Not on file   Number of children: 2   Years of education: GED   Highest education level: Not on file  Occupational History   Occupation: Housewife  Tobacco Use   Smoking status: Former    Current packs/day: 0.25    Average packs/day: 0.3 packs/day for 8.0 years (2.0 ttl pk-yrs)    Types: Cigarettes   Smokeless tobacco: Never   Tobacco comments:    up to 1/2 ppl x 8 years  Vaping Use   Vaping status: Never Used  Substance and Sexual Activity   Alcohol use: No   Drug use: No   Sexual activity: Yes    Birth control/protection: Surgical  Other Topics Concern   Not on file  Social History Narrative   Lives with daughter, son and husband in a one  story home.  Has 2 children.  Does not work at this time.  Education: GED.    Right-handed.   4-5 twelve ounce cans per day.   Social Drivers of Corporate investment banker Strain: Not on file  Food Insecurity: Not on file  Transportation Needs: Not on file  Physical Activity: Not on file  Stress: Not on file  Social Connections: Not on file   Review of Systems   Gen: Denies fever, chills, anorexia. Denies fatigue, weakness, weight loss.  CV: Denies chest pain, palpitations, syncope, peripheral edema, and claudication. Resp: Denies dyspnea at rest, cough, wheezing, coughing up blood, and pleurisy. GI: See HPI Derm: Denies rash, itching, dry skin Psych: Denies depression, anxiety, memory loss, confusion. No homicidal or suicidal ideation.  Heme: Denies bruising, bleeding, and enlarged lymph nodes.  Physical Exam   BP 137/84 (BP Location: Right Arm, Patient Position: Sitting, Cuff Size: Large)   Pulse 88   Temp 97.8 F (36.6 C) (Temporal)   Ht 5\' 7"  (1.702 m)   Wt 264 lb 3.2 oz (119.8 kg)   LMP 08/25/2012   BMI 41.38 kg/m   General:   Alert and oriented. No distress noted. Pleasant and cooperative.  Head:  Normocephalic and atraumatic. Eyes:  Conjuctiva clear without scleral icterus. Mouth:  Oral mucosa pink and moist. Good dentition. No lesions. Lungs:  Clear to  auscultation bilaterally. No wheezes, rales, or rhonchi. No distress.  Heart:  S1, S2 present without murmurs appreciated.  Abdomen:  +BS, soft, non-tender and non-distended. No rebound or guarding. No HSM or masses noted. Rectal: deferred Msk:  Symmetrical without gross deformities. Normal posture. Extremities:  Without edema. Neurologic:  Alert and  oriented x4 Psych:  Alert and cooperative. Normal mood and affect.  Assessment  Monica Ballard is a 46 y.o. female with a history of IBS, anemia, anxiety/depression, asthma, GERD, HTN, HLD, chronic nausea, and cholecystectomy presenting today for follow-up of IBS,  GERD, and nausea.  IBS-constipation, bloating: Has failed Linzess in the past due to excessive diarrhea.  Has also tried and failed Metamucil due to increased bloating and flatulence.  Also has tried MiraLAX 1 capful twice daily without significant improvement.  Currently doing fairly well with Amitiza 8 mcg twice daily.  Having more Bristol 4 stools and having a bowel movement 2-3 times per week versus once every 5-10 days.  In regards to her bloating she has had negative serologic workup for celiac, alpha gal, and sucrase deficiency.  Bloating likely could be sensory in nature however we will again rule out hydrogen, hydrogen sulfide, and methane overproduction.  Will submit for Trio smart breath test.  Also just in case bloating related to gastroparesis we will  obtain gastric emptying study.  GERD, Nausea, LUQ pain: Continues to have daily nausea and reflux symptoms.  She takes Phenergan 3 times a day for her nausea.  Reflux symptoms possibly somewhat better controlled with rabeprazole 20 mg once daily and takes famotidine for breakthrough.  Continues to have more nocturnal symptoms and does sleep propped up.  Recently underwent EGD in October with evidence of antral erosions.  Unable to get Voquezna approved therefore her Nexium was discontinued and she was sent in rabeprazole.  Possibly some improvement with rabeprazole since switch but given she is only taking this once daily will increase to twice daily and continue Pepcid as needed.  Gastroparesis diet reinforced.  Phenergan refilled.  Assessing for gastroparesis with gastric emptying study.  Received her prior GI records since her last office visit and it appears she did not have a colonoscopy in January 2020.  Last colonoscopy in 2003.  Given she is past the age of 62 and due for colon cancer screening we discussed colonoscopy today.  We discussed options for prep including lower volume prep.  We will assess with gastric emptying study to rule out  her cause of nausea and discuss further potentially scheduling colonoscopy after her next follow-up.  She does have a fairly strong family history of colon cancer on her paternal lineage (diagnosed in their 80s).   PLAN   GES Continue Amitiza 8 mcg twice daily Continue rabeprazole 20 mg, increase to twice daily. Continue pepcid as needed.  GERD diet 4-6 small meals daily Phenergan as needed.  Trio smart breath test.  Potentially schedule colonoscopy at next follow-up per patient request.  Will likely need low-volume prep. Potential healthy weight and wellness referral.  Follow up in 3 months.   Brooke Bonito, MSN, FNP-BC, AGACNP-BC [facility]

## 2023-07-11 NOTE — Patient Instructions (Addendum)
We will get you scheduled for a gastric emptying study in near future.   Continue Amitiza 8 mcg twice daily.  If any point you feel like your bowel movements are slowing up please let me know and we can consider increasing to the 24 mcg tablets.  Continue omeprazole 20 mg.  We are increasing this to twice daily, 30 minutes prior to breakfast and dinner.  You may continue to take famotidine (Pepcid) either 20 or 40 mg at night.  If needed you can take 20 mg midday and 20 mg before bed.  Follow a GERD diet:  Avoid fried, fatty, greasy, spicy, citrus foods. Avoid caffeine and carbonated beverages. Avoid chocolate. Try eating 4-6 small meals a day rather than 3 large meals. Do not eat within 3 hours of laying down. Prop head of bed up on wood or bricks to create a 6 inch incline.  Gastroparesis recommendations:  4-6 small meals daily Low fat diet Low fiber diet (avoid raw fruits and vegetables).  We will send you the Trio smart breath test to your home.  We will submit the order form and they will reach out to you to schedule delivery.  Once I receive the results we will be in touch.  In regards to your weight gain.  If you decide you would like to see [place]  healthy weight and wellness for consultation to help with a comprehensive approach to weight loss including diet, exercise, and any potential medication therapy then please let me know.  Follow-up in 3 months, sooner if needed.  I hope you have a [place] Christmas and a happy new year!  It was a pleasure to see you today. I want to create trusting relationships with patients. If you receive a survey regarding your visit,  I greatly appreciate you taking time to fill this out on paper or through your MyChart. I value your feedback.  Brooke Bonito, MSN, FNP-BC, AGACNP-BC [facility]

## 2023-07-12 ENCOUNTER — Encounter: Payer: Self-pay | Admitting: *Deleted

## 2023-07-15 ENCOUNTER — Other Ambulatory Visit: Payer: Self-pay | Admitting: Orthopaedic Surgery

## 2023-07-15 MED ORDER — HYDROCODONE-ACETAMINOPHEN 5-325 MG PO TABS: 1.0000 | ORAL_TABLET | Freq: Four times a day (QID) | ORAL | 0 refills | Status: DC | PRN

## 2023-07-18 ENCOUNTER — Encounter (HOSPITAL_COMMUNITY): Payer: No Typology Code available for payment source

## 2023-07-21 ENCOUNTER — Other Ambulatory Visit: Payer: Self-pay | Admitting: Orthopedic Surgery

## 2023-07-22 MED ORDER — HYDROCODONE-ACETAMINOPHEN 5-325 MG PO TABS: 1.0000 | ORAL_TABLET | Freq: Four times a day (QID) | ORAL | 0 refills | Status: DC | PRN

## 2023-07-28 ENCOUNTER — Telehealth: Payer: Self-pay | Admitting: Orthopedic Surgery

## 2023-07-29 MED ORDER — HYDROCODONE-ACETAMINOPHEN 5-325 MG PO TABS: ORAL_TABLET | ORAL | 0 refills | Status: DC

## 2023-08-04 ENCOUNTER — Telehealth: Payer: Self-pay | Admitting: Orthopaedic Surgery

## 2023-08-05 MED ORDER — HYDROCODONE-ACETAMINOPHEN 5-325 MG PO TABS: ORAL_TABLET | ORAL | 0 refills | Status: DC

## 2023-08-06 ENCOUNTER — Other Ambulatory Visit: Payer: Self-pay | Admitting: Orthopaedic Surgery

## 2023-08-14 ENCOUNTER — Ambulatory Visit: Payer: No Typology Code available for payment source | Admitting: Orthopaedic Surgery

## 2023-08-21 ENCOUNTER — Encounter: Payer: Self-pay | Admitting: Orthopaedic Surgery

## 2023-08-21 ENCOUNTER — Ambulatory Visit: Payer: No Typology Code available for payment source | Admitting: Orthopaedic Surgery

## 2023-08-21 VITALS — BP 121/85 | HR 90 | Ht 67.0 in | Wt 263.0 lb

## 2023-08-21 DIAGNOSIS — G8929 Other chronic pain: Secondary | ICD-10-CM | POA: Diagnosis not present

## 2023-08-21 DIAGNOSIS — M25561 Pain in right knee: Secondary | ICD-10-CM

## 2023-08-21 MED ORDER — HYDROCODONE-ACETAMINOPHEN 5-325 MG PO TABS: ORAL_TABLET | ORAL | 0 refills | Status: DC

## 2023-08-21 NOTE — Progress Notes (Signed)
My knee still hurts.  Her right knee has been hurting more with the cold weather.  She has no new trauma.  It swells at work but does not give way.  Prior MRI was negative.  She cannot take NSAIDs.  Pain medicine helps.  She does use rubs on the knee.  Right knee has slight effusion, crepitus, full ROM, stable, no limp, NV intact.  No distal edema.  Encounter Diagnosis  Name Primary?   Chronic pain of right knee Yes   I have reviewed the [place] Controlled Substance Reporting System web site prior to prescribing narcotic medicine for this patient.  Return in three months.  Continue the rubs.  Call if any problem.  Precautions discussed.  Electronically Signed Darreld Mclean, MD 1/29/20251:32 PM

## 2023-08-22 ENCOUNTER — Ambulatory Visit: Payer: No Typology Code available for payment source | Admitting: Orthopaedic Surgery

## 2023-08-27 ENCOUNTER — Telehealth: Payer: Self-pay | Admitting: Orthopaedic Surgery

## 2023-08-27 MED ORDER — HYDROCODONE-ACETAMINOPHEN 5-325 MG PO TABS: ORAL_TABLET | ORAL | 0 refills | Status: DC

## 2023-09-04 ENCOUNTER — Other Ambulatory Visit: Payer: Self-pay | Admitting: Orthopaedic Surgery

## 2023-09-04 ENCOUNTER — Telehealth: Payer: Self-pay | Admitting: Orthopaedic Surgery

## 2023-09-05 MED ORDER — HYDROCODONE-ACETAMINOPHEN 5-325 MG PO TABS: ORAL_TABLET | ORAL | 0 refills | Status: DC

## 2023-09-11 ENCOUNTER — Telehealth: Payer: Self-pay | Admitting: Orthopaedic Surgery

## 2023-09-12 MED ORDER — HYDROCODONE-ACETAMINOPHEN 5-325 MG PO TABS: ORAL_TABLET | ORAL | 0 refills | Status: DC

## 2023-09-20 ENCOUNTER — Telehealth: Payer: Self-pay | Admitting: Orthopaedic Surgery

## 2023-09-23 ENCOUNTER — Other Ambulatory Visit: Payer: Self-pay | Admitting: Orthopaedic Surgery

## 2023-09-25 MED ORDER — HYDROCODONE-ACETAMINOPHEN 5-325 MG PO TABS: ORAL_TABLET | ORAL | 0 refills | Status: DC

## 2023-10-02 ENCOUNTER — Other Ambulatory Visit: Payer: Self-pay | Admitting: Gastroenterology

## 2023-10-02 ENCOUNTER — Other Ambulatory Visit: Payer: Self-pay | Admitting: Orthopaedic Surgery

## 2023-10-02 ENCOUNTER — Encounter: Payer: Self-pay | Admitting: Orthopaedic Surgery

## 2023-10-02 DIAGNOSIS — R110 Nausea: Secondary | ICD-10-CM

## 2023-10-10 ENCOUNTER — Ambulatory Visit: Payer: No Typology Code available for payment source | Admitting: Gastroenterology

## 2023-11-20 ENCOUNTER — Encounter: Payer: Self-pay | Admitting: Orthopaedic Surgery

## 2023-11-20 ENCOUNTER — Ambulatory Visit: Payer: No Typology Code available for payment source | Admitting: Orthopaedic Surgery

## 2023-11-20 VITALS — BP 116/81 | HR 80

## 2023-11-20 DIAGNOSIS — M25561 Pain in right knee: Secondary | ICD-10-CM | POA: Diagnosis not present

## 2023-11-20 DIAGNOSIS — G8929 Other chronic pain: Secondary | ICD-10-CM

## 2023-11-20 NOTE — Progress Notes (Signed)
 My knee still hurts  She has pain and giving way of the right knee.  This has been going on a long time.  MRI 07-09-23 showed no meniscus injury but changes on the medial patella.  She cannot take NSAIDs.  There is no new trauma.  No redness.  No numbness.  Right knee has very good ROM, slight crepitus, slight effusion, stable, no redness, no limp, no distal edema, NV intact.  Encounter Diagnosis  Name Primary?   Chronic pain of right knee Yes   Use the rubs she has.  Return in three months.  Call if any problem.  Precautions discussed.  Electronically Signed Pleasant Brilliant, MD 4/30/20251:41 PM .

## 2023-11-28 ENCOUNTER — Other Ambulatory Visit: Payer: Self-pay | Admitting: Gastroenterology

## 2023-11-28 DIAGNOSIS — R110 Nausea: Secondary | ICD-10-CM

## 2023-12-10 IMAGING — MR MR CERVICAL SPINE W/O CM
4 of 5 series · 28 of 48 positions shown · non-contrast
Comparison: Cervical spine MRI 06/09/2016. Radiographs of the
cervical spine 08/25/2021.

CLINICAL DATA: Provided history: Lumbar radiculopathy. Additional
history provided by scanning technologist: Patient reports neck pain
radiating into left shoulder for 1 year. Left arm numbness.

EXAM:
MRI CERVICAL SPINE WITHOUT CONTRAST
TECHNIQUE: Multiplanar, multisequence MR imaging of the cervical spine was
performed. No intravenous contrast was administered.

[Series 3: T2 · sagittal · 3.0mm · 0.66mm/px · 8 of 16 slices shown (1 of 2)]
[im 1/16]
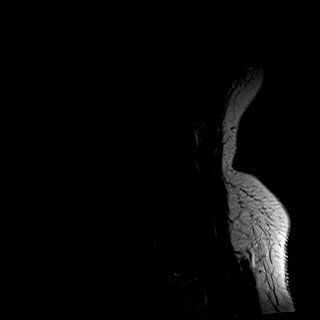
[im 3/16]
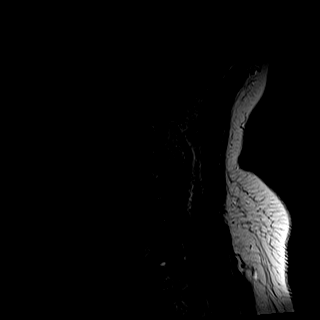
[im 5/16]
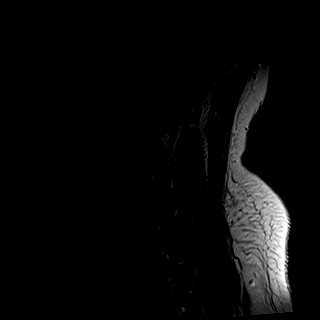
[im 7/16]
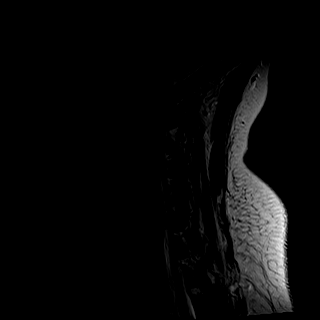
[im 9/16]
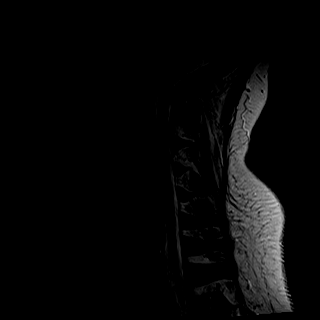
[im 11/16]
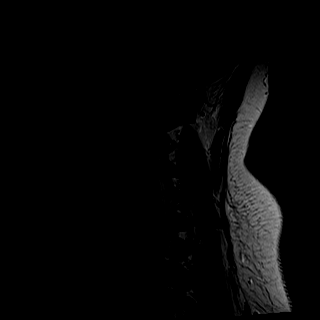
[im 13/16]
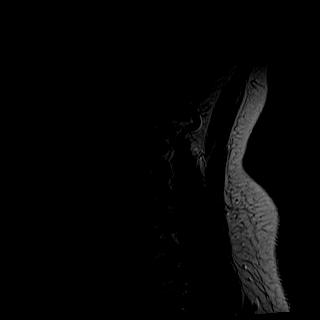
[im 16/16]
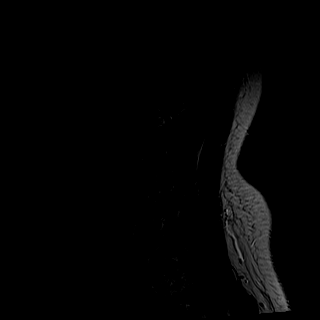

[Series 4: T1 · sagittal · 3.0mm · 0.41mm/px · 7 of 16 slices shown]
[im 1/16]
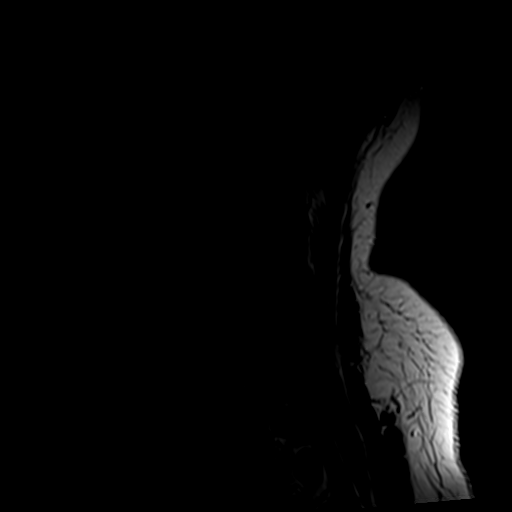
[im 3/16]
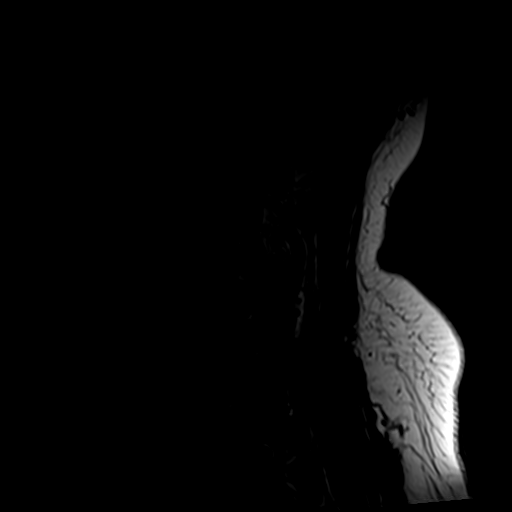
[im 6/16]
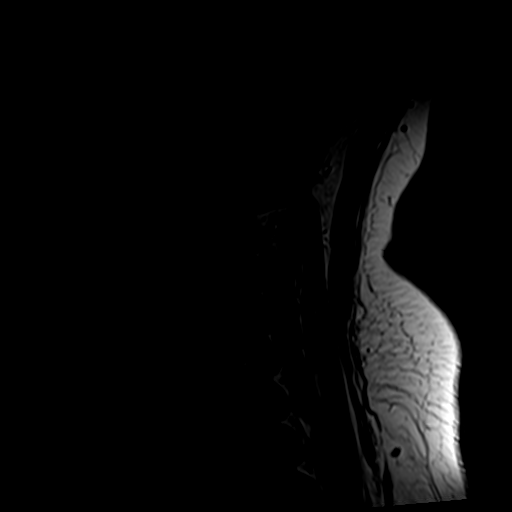
[im 8/16]
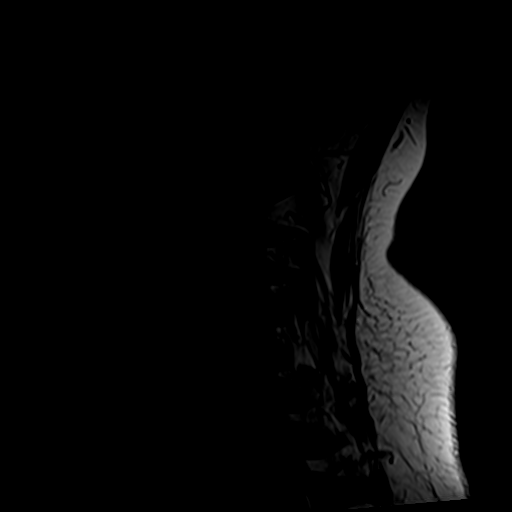
[im 11/16]
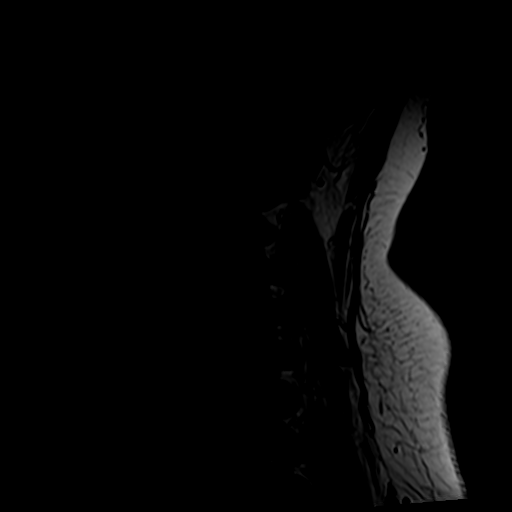
[im 13/16]
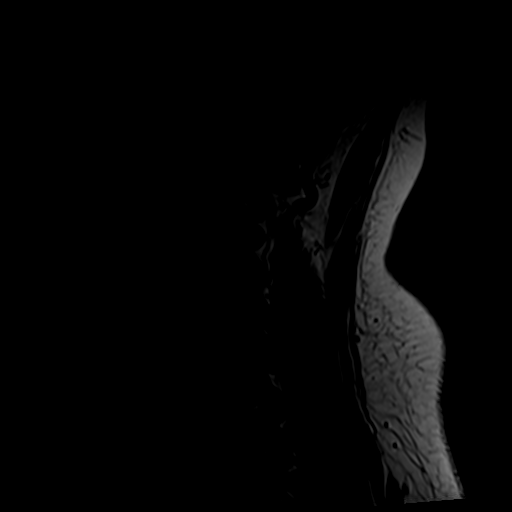
[im 16/16]
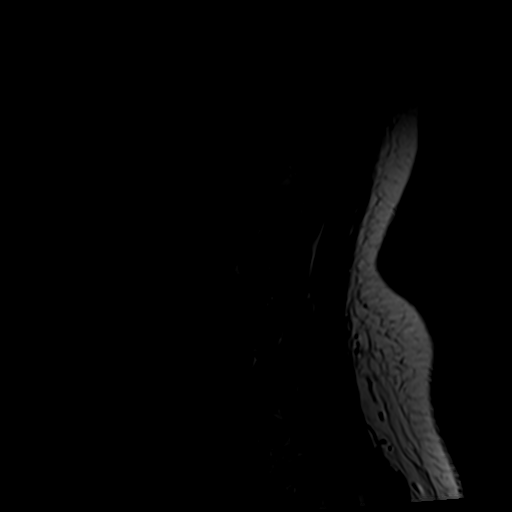

[Series 5: tir sag · sagittal · 3.0mm · 0.41mm/px · 4 of 16 slices shown]
[im 1/16]
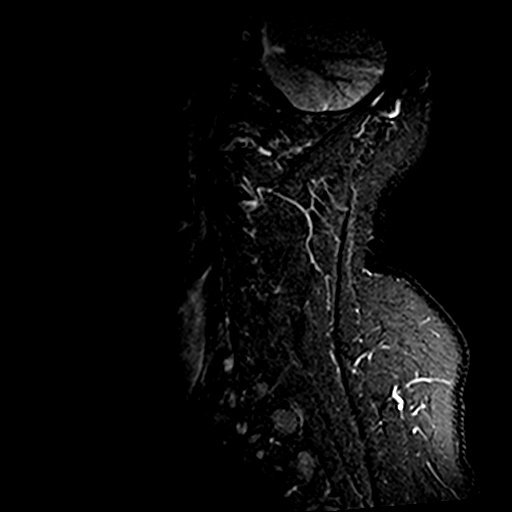
[im 3/16]
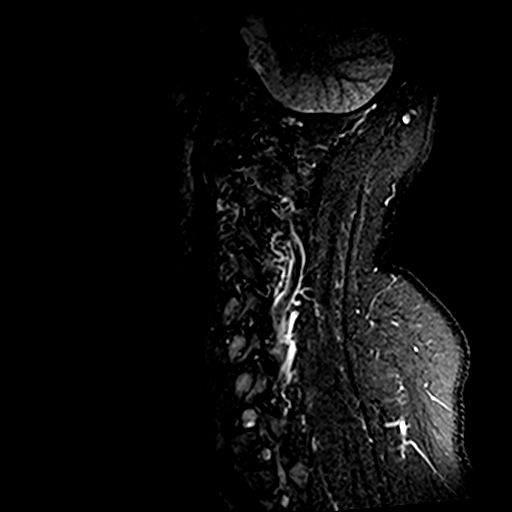
[im 8/16]
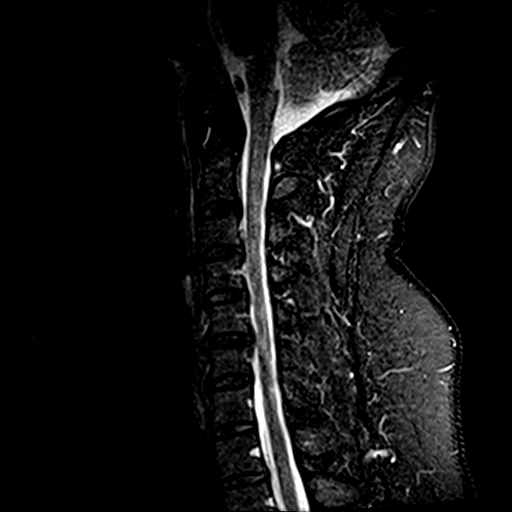
[im 13/16]
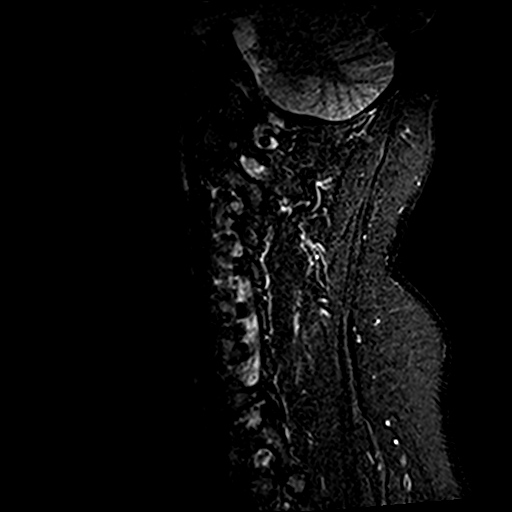

[Series 7: T2 · axial · 3.0mm · 0.70mm/px · z∈[-84,+21]mm · 9 of 29 slices shown (2 of 2)]
[im 1/29]
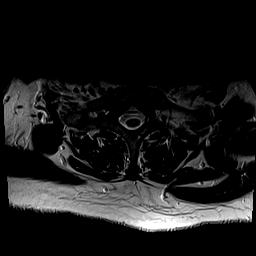
[im 5/29]
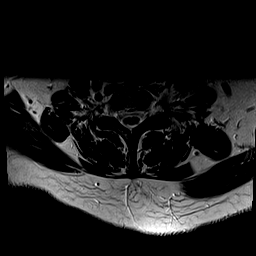
[im 10/29]
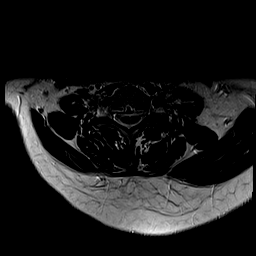
[im 12/29]
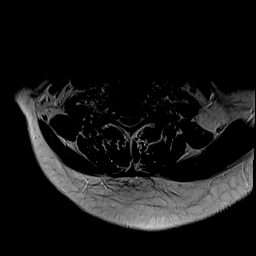
[im 15/29]
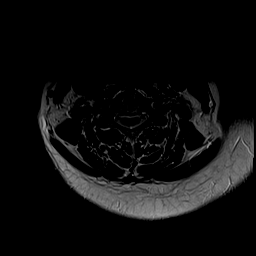
[im 17/29]
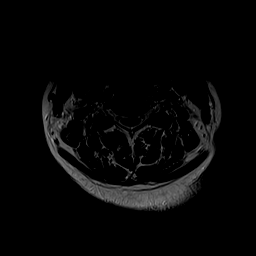
[im 19/29]
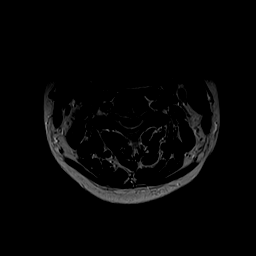
[im 24/29]
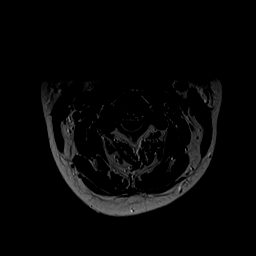
[im 29/29]
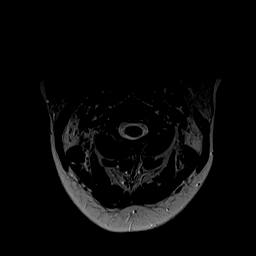

[28 of 48 positions shown; findings below may reference images not displayed]

FINDINGS: Intermittently motion degraded examination. Most notably, there is
mild-to-moderate motion degradation of the sagittal T2 TSE sequence.

Alignment: Mild reversal of the expected cervical lordosis. No
significant spondylolisthesis.

Vertebrae: Vertebral body height is maintained. No significant
marrow edema or focal suspicious osseous lesion.

Cord: No signal abnormality identified within the cervical spinal
cord.

Posterior Fossa, vertebral arteries, paraspinal tissues: No
abnormality identified within included portions of the posterior
fossa. Flow voids preserved within the imaged cervical vertebral
arteries. No paraspinal mass or collection.

Disc levels:

Unless otherwise stated, the level by level findings below have not
significantly changed from the prior MRI of 06/09/2016.

Progressive mild-to-moderate disc degeneration at C5-C6. No more
than mild disc degeneration at the remaining levels.

C2-C3: Posterior disc osteophyte complex eccentric to the left, new
from the prior exam. Mild partial effacement of the ventral thecal
sac (without spinal cord mass effect). No significant foraminal
stenosis.

C3-C4: Central posterior annular fissure and shallow disc bulge, new
from the prior MRI. Mild uncovertebral hypertrophy (predominantly on
the right). No significant spinal canal or foraminal stenosis.

C4-C5: Tiny central disc protrusion eccentric to the left, not
definitively present on the prior MRI. The disc protrusion results
in mild focal effacement of the ventral thecal sac, and may contact
the ventral aspect of spinal cord. No significant foraminal
stenosis.

C5-C6: Disc bulge with progressive bilateral disc osteophyte
ridge/uncinate hypertrophy. As before, there is a superimposed
broad-based central disc protrusion (at site of posterior annular
fissure). The disc protrusion contributes to mild spinal canal
stenosis and contacts the ventral spinal cord. Mild relative
bilateral neural foraminal narrowing, new from the prior MRI.

C6-C7: Tiny central disc protrusion eccentric to the right. The disc
protrusion results in minimal focal effacement of the ventral thecal
sac (without spinal cord mass effect). No significant foraminal
stenosis.

C7-T1: No significant disc herniation or stenosis.
IMPRESSION: Intermittently motion degraded exam.

Cervical spondylosis, as outlined and having progressed at multiple
levels since the prior MRI of 06/09/2016. Findings are most notably
as follows.

At C5-C6, there is progressive mild-to-moderate disc degeneration.
Disc bulge with progressive bilateral disc osteophyte ridge/uncinate
hypertrophy. Redemonstrated superimposed broad-based central disc
protrusion (at site of posterior annular fissure). As before, the
disc protrusion contributes to mild spinal canal stenosis and
contacts the ventral aspect of the spinal cord. Mild relative
bilateral neural foraminal narrowing, new from the prior MRI.

At C2-C3, there is a new broad-based posterior disc osteophyte
complex eccentric to the left. This results in mild relative spinal
canal narrowing (without spinal cord mass effect).

No more than mild relative spinal canal, and no significant
foraminal stenosis, at the remaining levels.

Mild reversal of the expected cervical lordosis, nonspecific.

## 2024-01-09 ENCOUNTER — Other Ambulatory Visit: Payer: Self-pay | Admitting: Family Medicine

## 2024-01-09 DIAGNOSIS — N6315 Unspecified lump in the right breast, overlapping quadrants: Secondary | ICD-10-CM

## 2024-01-28 ENCOUNTER — Other Ambulatory Visit

## 2024-01-28 ENCOUNTER — Encounter

## 2024-02-03 ENCOUNTER — Ambulatory Visit
Admission: RE | Admit: 2024-02-03 | Discharge: 2024-02-03 | Disposition: A | Discharge status: Home / Self Care | Source: Ambulatory Visit | Attending: Family Medicine | Admitting: Family Medicine

## 2024-02-03 ENCOUNTER — Ambulatory Visit
Admission: RE | Admit: 2024-02-03 | Discharge: 2024-02-03 | Disposition: A | Discharge status: Home / Self Care | Source: Ambulatory Visit | Attending: Family Medicine

## 2024-02-03 DIAGNOSIS — N6315 Unspecified lump in the right breast, overlapping quadrants: Secondary | ICD-10-CM

## 2024-02-06 ENCOUNTER — Other Ambulatory Visit: Payer: Self-pay | Admitting: Family Medicine

## 2024-02-06 DIAGNOSIS — N6310 Unspecified lump in the right breast, unspecified quadrant: Secondary | ICD-10-CM

## 2024-02-07 ENCOUNTER — Encounter: Payer: Self-pay | Admitting: Advanced Practice Midwife

## 2024-02-19 ENCOUNTER — Ambulatory Visit: Admitting: Orthopaedic Surgery

## 2024-02-26 ENCOUNTER — Ambulatory Visit: Admitting: Orthopaedic Surgery

## 2024-03-13 ENCOUNTER — Encounter: Payer: Self-pay | Admitting: Radiology

## 2024-05-25 ENCOUNTER — Encounter: Payer: Self-pay | Admitting: Radiology

## 2024-05-31 ENCOUNTER — Other Ambulatory Visit: Payer: Self-pay | Admitting: Gastroenterology

## 2024-05-31 DIAGNOSIS — R110 Nausea: Secondary | ICD-10-CM

## 2024-07-01 ENCOUNTER — Other Ambulatory Visit: Payer: Self-pay | Admitting: Gastroenterology

## 2024-07-13 ENCOUNTER — Other Ambulatory Visit: Payer: Self-pay | Admitting: Gastroenterology

## 2024-07-13 DIAGNOSIS — K219 Gastro-esophageal reflux disease without esophagitis: Secondary | ICD-10-CM

## 2024-08-20 ENCOUNTER — Other Ambulatory Visit: Payer: Self-pay

## 2024-08-26 ENCOUNTER — Other Ambulatory Visit

## 2024-09-02 ENCOUNTER — Other Ambulatory Visit
